# Patient Record
Sex: Female | Born: 1970 | Race: Black or African American | Hispanic: No | Marital: Married | Smoking: Never smoker
Health system: Southern US, Community
[De-identification: ages and names within clinical notes are randomized; demographics above are authoritative.]

## PROBLEM LIST (undated history)

## (undated) ENCOUNTER — Emergency Department (HOSPITAL_COMMUNITY): Admission: EM | Source: Home / Self Care

## (undated) DIAGNOSIS — O99019 Anemia complicating pregnancy, unspecified trimester: Secondary | ICD-10-CM

## (undated) DIAGNOSIS — I10 Essential (primary) hypertension: Secondary | ICD-10-CM

## (undated) DIAGNOSIS — R12 Heartburn: Secondary | ICD-10-CM

## (undated) DIAGNOSIS — O26899 Other specified pregnancy related conditions, unspecified trimester: Secondary | ICD-10-CM

## (undated) HISTORY — PX: NO PAST SURGERIES: SHX2092

## (undated) HISTORY — DX: Anemia complicating pregnancy, unspecified trimester: O99.019

---

## 2012-10-26 ENCOUNTER — Ambulatory Visit: Payer: BC Managed Care – PPO | Admitting: Family Medicine

## 2012-10-26 VITALS — BP 152/100 | HR 88 | Temp 99.4°F | Resp 12 | Ht 64.0 in | Wt 155.0 lb

## 2012-10-26 DIAGNOSIS — J029 Acute pharyngitis, unspecified: Secondary | ICD-10-CM

## 2012-10-26 DIAGNOSIS — R079 Chest pain, unspecified: Secondary | ICD-10-CM

## 2012-10-26 DIAGNOSIS — O0001 Abdominal pregnancy with intrauterine pregnancy: Secondary | ICD-10-CM

## 2012-10-26 DIAGNOSIS — K219 Gastro-esophageal reflux disease without esophagitis: Secondary | ICD-10-CM

## 2012-10-26 DIAGNOSIS — I10 Essential (primary) hypertension: Secondary | ICD-10-CM

## 2012-10-26 DIAGNOSIS — R5383 Other fatigue: Secondary | ICD-10-CM

## 2012-10-26 DIAGNOSIS — R5381 Other malaise: Secondary | ICD-10-CM

## 2012-10-26 LAB — POCT RAPID STREP A (OFFICE): Rapid Strep A Screen: NEGATIVE

## 2012-10-26 LAB — POCT URINE PREGNANCY: Preg Test, Ur: POSITIVE

## 2012-10-26 LAB — POCT UA - MICROSCOPIC ONLY
Bacteria, U Microscopic: NEGATIVE
Casts, Ur, LPF, POC: NEGATIVE
Crystals, Ur, HPF, POC: NEGATIVE
Mucus, UA: NEGATIVE
Yeast, UA: NEGATIVE

## 2012-10-26 LAB — POCT URINALYSIS DIPSTICK
Blood, UA: NEGATIVE
Protein, UA: NEGATIVE
Spec Grav, UA: 1.015
Urobilinogen, UA: 0.2
pH, UA: 7

## 2012-10-26 LAB — POCT CBC
Granulocyte percent: 67.9 %G (ref 37–80)
Lymph, poc: 2 (ref 0.6–3.4)
MCHC: 31 g/dL — AB (ref 31.8–35.4)
MCV: 76.2 fL — AB (ref 80–97)
MID (cbc): 0.5 (ref 0–0.9)
POC LYMPH PERCENT: 25.3 %L (ref 10–50)
Platelet Count, POC: 480 10*3/uL — AB (ref 142–424)
RDW, POC: 19.8 %

## 2012-10-26 MED ORDER — LABETALOL HCL 100 MG PO TABS: 100.0000 mg | ORAL_TABLET | Freq: Two times a day (BID) | ORAL | Status: DC

## 2012-10-26 NOTE — Progress Notes (Addendum)
Urgent [facility] and [facility] [address], [place] [address] [phone]  Date:  10/26/2012   Name:  Megan Baxter   DOB:  [birth year]   MRN:  [id number]  PCP:  No primary provider on file.    Chief Complaint: trouble swallowing and chest congestion   History of Present Illness:  Megan Baxter is a 42 y.o. very pleasant female patient who presents with the following:  Here today with illness. She is here today with her husband who interpreted for her, as she does not speak [place] She is pregnant currently.  She is currently about [redacted] weeks pregnant.  They have not seen a doctor yet for her pregnancy.   She does not have a ST.  She feels like there is mucus caught in her throat, she has to clear her throat and try to cough it up.  She is not actually having any trouble swallowing food/ liquid or breathing.   No cough, she has felt feverish at home sometimes.  She has not checked her temperature.  She also notes heartburn- burning over her epigastric area. She is bringing up acid/ food into her throat.  She also notes some left sided chest pain- this history is vague, but has been occuring for about 8 months, will last a few seconds or minutes, and is not related to exertion.  She has no known history of cardiac problems.    LMP 08/13/2012- this will make her 10+4, [facility] 05/20/2013 She has not noted any vaginal bleeding, no lower abdominal cramping or pain  She has been told that her BP was high in the past, and she was treated with some sort of medication in the past (while in [place]).  She is not sure what she took and is not taking it now There is no problem list on file for this patient.   No past medical history on file.  No past surgical history on file.  History  Substance Use Topics  . Smoking status: Never Smoker   . Smokeless tobacco: Not on file  . Alcohol Use: Not on file    No family history on file.  No Known Allergies  Medication list has been reviewed  and updated.  No current outpatient prescriptions on file prior to visit.   No current facility-administered medications on file prior to visit.    Review of Systems:  As per HPI- otherwise negative. History through husband as she does not speak English  Physical Examination: Filed Vitals:   10/26/12 1058  BP: 152/92  Pulse: 88  Temp: 99.4 F (37.4 C)  Resp: 12   Filed Vitals:   10/26/12 1058  Height: 5\' 4"  (1.626 m)  Weight: 155 lb (70.308 kg)   Body mass index is 26.59 kg/(m^2). Ideal Body Weight: Weight in (lb) to have BMI = 25: 145.3  GEN: WDWN, NAD, Non-toxic, A & O x 3 HEENT: Atraumatic, Normocephalic. Neck supple. No masses, No LAD.  Bilateral TM wnl, oropharynx normal.  PEERL,EOMI.   Ears and Nose: No external deformity. CV: RRR, No M/G/R. No JVD. No thrill. No extra heart sounds. PULM: CTA B, no wheezes, crackles, rhonchi. No retractions. No resp. distress. No accessory muscle use. ABD: S, NT, ND. No rebound. No HSM.  No fundus palpated.   Not able to reproduce her CP by pressing on her chest wall, no rash or lesion.   EXTR: No c/c/e NEURO Normal gait.  PSYCH: Normally interactive. Conversant. Not depressed or anxious appearing.  Calm demeanor.   Results for orders placed in visit on 10/26/12  POCT RAPID STREP A (OFFICE)      Result Value Range   Rapid Strep A Screen Negative  Negative  POCT CBC      Result Value Range   WBC 7.9  4.6 - 10.2 K/uL   Lymph, poc 2.0  0.6 - 3.4   POC LYMPH PERCENT 25.3  10 - 50 %L   MID (cbc) 0.5  0 - 0.9   POC MID % 6.8  0 - 12 %M   POC Granulocyte 5.4  2 - 6.9   Granulocyte percent 67.9  37 - 80 %G   RBC 4.02 (*) 4.04 - 5.48 M/uL   Hemoglobin 9.5 (*) 12.2 - 16.2 g/dL   HCT, POC 16.1 (*) 09.6 - 47.9 %   MCV 76.2 (*) 80 - 97 fL   MCH, POC 23.6 (*) 27 - 31.2 pg   MCHC 31.0 (*) 31.8 - 35.4 g/dL   RDW, POC 04.5     Platelet Count, POC 480 (*) 142 - 424 K/uL   MPV 8.0  0 - 99.8 fL  POCT URINALYSIS DIPSTICK      Result  Value Range   Color, UA light yellow     Clarity, UA clear     Glucose, UA neg     Bilirubin, UA neg     Ketones, UA neg     Spec Grav, UA 1.015     Blood, UA neg     pH, UA 7.0     Protein, UA neg     Urobilinogen, UA 0.2     Nitrite, UA neg     Leukocytes, UA Negative    POCT URINE PREGNANCY      Result Value Range   Preg Test, Ur Positive    POCT UA - MICROSCOPIC ONLY      Result Value Range   WBC, Ur, HPF, POC 0-1     RBC, urine, microscopic neg     Bacteria, U Microscopic neg     Mucus, UA neg     Epithelial cells, urine per micros 0-1     Crystals, Ur, HPF, POC neg     Casts, Ur, LPF, POC neg     Yeast, UA neg      EKG: NSR without any concerning ST elevation or depression  Called and discussed with CNM on duty at [facility] hospital. As she is early in her pregnancy, pre- eclampsia is not a major concern.  She has no proteinuria.  Plan to start her on labetalol for her HTN at 100 mg BID.  Will have her get established with the OB of her choice, and can follow- up her BP here until she is established with OB.  However, no issue today that necessitates a visit to the [facility] emergency department   Assessment and Plan:  Acute pharyngitis - Plan: POCT rapid strep A  GERD (gastroesophageal reflux disease)  Other malaise and fatigue - Plan: POCT CBC, POCT UA - Microscopic Only  Abdominal pregnancy with intrauterine pregnancy - Plan: POCT urinalysis dipstick, POCT urine pregnancy, POCT UA - Microscopic Only  Chest pain - Plan: EKG 12-Lead  HTN (hypertension) - Plan: labetalol (NORMODYNE) 100 MG tablet  Essential hypertension and pregnancy.  Will treat with labetalol 100 BID Chest pain: pt relates this as being due to her GERD/ chest congestion symptoms.  EKG today is reassuring, and her pain is atypical in character.  They are to monitor her symptoms and seek care if any change or worsening Suspect her chest congestion/ swallowing symptoms are due to GERD. Printed out OTC  medications in pregnancy sheet from [facility] and suggested appropriate OTC medications Anemia: start PNV, needs follow-up, check ferritin and will be in touch with her Encouraged pt and her husband to look at [facility] websites today to choose a provider, and to call as soon as possible to schedule her first appt.  In the meantime, will check her back in one week for a BP check   Meds ordered this encounter  Medications  . labetalol (NORMODYNE) 100 MG tablet    Sig: Take 1 tablet (100 mg total) by mouth 2 (two) times daily.    Dispense:  60 tablet    Refill:  3     Signed Abbe Amsterdam, MD

## 2012-10-26 NOTE — Patient Instructions (Addendum)
We are going to have you start on labetolol for your blood pressure.  Also, please buy and start on pre- natal vitamins right away.  Please come back and see [place] early next week for a BP check  Please call the OB office of your choice and schedule a new pre- natal appointment as soon as you can.    Some choices are [facility], [facility] [place] and [facility]

## 2012-10-27 ENCOUNTER — Telehealth: Payer: Self-pay | Admitting: Family Medicine

## 2012-10-27 NOTE — Telephone Encounter (Signed)
Called both numbers on chart to try and check on Megan Baxter.  At home number a female answered who did not speak [place] (unsure of who this person is, her husband does speak [place]).  Left message on cell that I was calling to check on Megan Baxter- let me know if she is not feeling better, otherwise will look for her next week.  If I see her again will check ferritin then.

## 2012-10-27 NOTE — Telephone Encounter (Signed)
Message copied by Pearline Cables on Thu Oct 27, 2012  4:50 PM ------      Message from: Honor Loh      Created: Thu Oct 27, 2012 10:06 AM       There were no labs sent out on this patient.       ----- Message -----         From: Pearline Cables, MD         Sent: 10/26/2012   8:51 PM           To: [facility] Lab Pool            Can we add on a ferritin?  Dx: microcytic anemia      Thanks!        ------

## 2012-11-02 ENCOUNTER — Ambulatory Visit: Payer: BC Managed Care – PPO | Admitting: Family Medicine

## 2012-11-02 VITALS — BP 137/92 | HR 78 | Temp 98.9°F | Resp 18 | Wt 151.0 lb

## 2012-11-02 DIAGNOSIS — O0001 Abdominal pregnancy with intrauterine pregnancy: Secondary | ICD-10-CM

## 2012-11-02 DIAGNOSIS — I10 Essential (primary) hypertension: Secondary | ICD-10-CM

## 2012-11-02 LAB — POCT URINALYSIS DIPSTICK
Blood, UA: NEGATIVE
Nitrite, UA: NEGATIVE
Protein, UA: NEGATIVE
Spec Grav, UA: 1.01
Urobilinogen, UA: 0.2
pH, UA: 7

## 2012-11-02 NOTE — Patient Instructions (Addendum)
I will make an appointment for you with a OB doctor

## 2012-11-02 NOTE — Progress Notes (Addendum)
[facility] [address], [place] [address] [phone]  Date:  11/02/2012   Name:  Megan Baxter   DOB:  25-Apr-1971   MRN:  [id number]  PCP:  No primary provider on file.    Chief Complaint: Hypertension   History of Present Illness:  Megan Baxter is a 42 y.o. very pleasant female patient who presents with the following:  Here today for a recheck- she is using mucinex and zantac OTC for congestion and GERD.  She is also pregnant, and I prescribed labetolol to treat her for HTN (essential HTN) at our last visit.  Her BP looks much better today, but on further questioning she has actually not started the labetolol.  They also have not called to schedule an app with OB- GYN as of yet.   She continues to spit out some saliva. She does not have any vomiting.  She admits she has had this sort of problem in the past with her other pregnancies. Her GERD symptoms are overall much better, and she no longer has any pain.  It seems that her GERD/ morning sickness is easing off.   No cramping, abdominal pain, or bleeding.   She feels well Patient Active Problem List  Diagnosis  . HTN (hypertension)    No past medical history on file.  No past surgical history on file.  History  Substance Use Topics  . Smoking status: Never Smoker   . Smokeless tobacco: Not on file  . Alcohol Use: Not on file    No family history on file.  No Known Allergies  Medication list has been reviewed and updated.  Current Outpatient Prescriptions on File Prior to Visit  Medication Sig Dispense Refill  . labetalol (NORMODYNE) 100 MG tablet Take 1 tablet (100 mg total) by mouth 2 (two) times daily.  60 tablet  3   No current facility-administered medications on file prior to visit.    Review of Systems:  As per HPI- otherwise negative.   Physical Examination: Filed Vitals:   11/02/12 1021  BP: 137/92  Pulse: 78  Temp: 98.9 F (37.2 C)  Resp: 18   Filed Vitals:    11/02/12 1021  Weight: 151 lb (68.493 kg)   Body mass index is 25.91 kg/(m^2). Ideal Body Weight:    GEN: WDWN, NAD, Non-toxic, Alert, language barrier but we communicated through her husband.   HEENT: Atraumatic, Normocephalic. Neck supple. No masses, No LAD.  PEERL, oropharynx wnl Ears and Nose: No external deformity. CV: RRR, No M/G/R. No JVD. No thrill. No extra heart sounds. PULM: CTA B, no wheezes, crackles, rhonchi. No retractions. No resp. distress. No accessory muscle use. ABD: S, NT, ND. No rebound. No HSM.  Fundus not palpable.  EXTR: No c/c/e NEURO Normal gait.  PSYCH: Normally interactive. Conversant. Not depressed or anxious appearing.  Calm demeanor.   Results for orders placed in visit on 11/02/12  POCT URINALYSIS DIPSTICK      Result Value Range   Color, UA yellow     Clarity, UA hazy     Glucose, UA neg     Bilirubin, UA neg     Ketones, UA neg     Spec Grav, UA 1.010     Blood, UA neg     pH, UA 7.0     Protein, UA neg     Urobilinogen, UA 0.2     Nitrite, UA neg     Leukocytes, UA Trace  Assessment and Plan: HTN (hypertension) - Plan: POCT urinalysis dipstick  Abdominal pregnancy with intrauterine pregnancy - Plan: Ambulatory referral to Obstetrics / Gynecology no proteinuria, and BP is better today.  Will ask referrals to schedule an OB appt for her, as I am not sure they understand how to do this.  Today her BP looks better, so her essential BP may be improving due to pregnancy.  Would like to avoid any unnecessary medications due to pregnancy, so will defer having her start labetolol.  If appt with OB not obtained within a week they are to come and see me for a BP recheck.     Signed Abbe Amsterdam, MD

## 2013-03-02 ENCOUNTER — Encounter: Payer: Self-pay | Admitting: Obstetrics & Gynecology

## 2013-03-02 LAB — US OB COMP + 14 WK

## 2013-04-13 ENCOUNTER — Other Ambulatory Visit: Payer: Self-pay | Admitting: Obstetrics and Gynecology

## 2013-04-14 ENCOUNTER — Other Ambulatory Visit: Payer: Self-pay | Admitting: Obstetrics and Gynecology

## 2013-04-25 ENCOUNTER — Other Ambulatory Visit: Payer: Self-pay | Admitting: Obstetrics and Gynecology

## 2013-04-25 DIAGNOSIS — O169 Unspecified maternal hypertension, unspecified trimester: Secondary | ICD-10-CM

## 2013-04-25 DIAGNOSIS — O09529 Supervision of elderly multigravida, unspecified trimester: Secondary | ICD-10-CM

## 2013-04-26 ENCOUNTER — Inpatient Hospital Stay (HOSPITAL_COMMUNITY): Payer: BC Managed Care – PPO | Admitting: Anesthesiology

## 2013-04-26 ENCOUNTER — Other Ambulatory Visit: Payer: Self-pay | Admitting: Obstetrics and Gynecology

## 2013-04-26 ENCOUNTER — Encounter (HOSPITAL_COMMUNITY): Payer: Self-pay | Admitting: Anesthesiology

## 2013-04-26 ENCOUNTER — Inpatient Hospital Stay (HOSPITAL_COMMUNITY)
Admission: AD | Admit: 2013-04-26 | Discharge: 2013-04-29 | DRG: 650 | Disposition: A | Discharge status: Home / Self Care | Payer: BC Managed Care – PPO | Source: Ambulatory Visit | Attending: Obstetrics and Gynecology | Admitting: Obstetrics and Gynecology

## 2013-04-26 ENCOUNTER — Encounter (HOSPITAL_COMMUNITY): Payer: Self-pay | Admitting: *Deleted

## 2013-04-26 ENCOUNTER — Encounter (HOSPITAL_COMMUNITY): Payer: Self-pay

## 2013-04-26 ENCOUNTER — Encounter (HOSPITAL_COMMUNITY)
Admission: AD | Disposition: A | Discharge status: Home / Self Care | Payer: Self-pay | Source: Ambulatory Visit | Attending: Obstetrics and Gynecology

## 2013-04-26 ENCOUNTER — Ambulatory Visit (HOSPITAL_COMMUNITY)
Admission: RE | Admit: 2013-04-26 | Discharge: 2013-04-26 | Disposition: A | Discharge status: Home / Self Care | Payer: BC Managed Care – PPO | Source: Ambulatory Visit | Attending: Obstetrics and Gynecology | Admitting: Obstetrics and Gynecology

## 2013-04-26 DIAGNOSIS — O1002 Pre-existing essential hypertension complicating childbirth: Principal | ICD-10-CM | POA: Diagnosis present

## 2013-04-26 DIAGNOSIS — O9903 Anemia complicating the puerperium: Secondary | ICD-10-CM | POA: Diagnosis not present

## 2013-04-26 DIAGNOSIS — O09529 Supervision of elderly multigravida, unspecified trimester: Secondary | ICD-10-CM

## 2013-04-26 DIAGNOSIS — D649 Anemia, unspecified: Secondary | ICD-10-CM | POA: Diagnosis not present

## 2013-04-26 DIAGNOSIS — O169 Unspecified maternal hypertension, unspecified trimester: Secondary | ICD-10-CM

## 2013-04-26 DIAGNOSIS — O365990 Maternal care for other known or suspected poor fetal growth, unspecified trimester, not applicable or unspecified: Secondary | ICD-10-CM | POA: Diagnosis present

## 2013-04-26 HISTORY — DX: Essential (primary) hypertension: I10

## 2013-04-26 HISTORY — DX: Other specified pregnancy related conditions, unspecified trimester: O26.899

## 2013-04-26 HISTORY — DX: Heartburn: R12

## 2013-04-26 LAB — CBC
MCH: 26.2 pg (ref 26.0–34.0)
MCHC: 33.6 g/dL (ref 30.0–36.0)
MCV: 77.9 fL — ABNORMAL LOW (ref 78.0–100.0)
Platelets: 350 10*3/uL (ref 150–400)
RDW: 14.7 % (ref 11.5–15.5)
WBC: 7.5 10*3/uL (ref 4.0–10.5)

## 2013-04-26 LAB — COMPREHENSIVE METABOLIC PANEL
AST: 10 U/L (ref 0–37)
Albumin: 3.1 g/dL — ABNORMAL LOW (ref 3.5–5.2)
Alkaline Phosphatase: 166 U/L — ABNORMAL HIGH (ref 39–117)
Chloride: 102 mEq/L (ref 96–112)
Creatinine, Ser: 0.49 mg/dL — ABNORMAL LOW (ref 0.50–1.10)
Potassium: 3.9 mEq/L (ref 3.5–5.1)
Total Bilirubin: 0.4 mg/dL (ref 0.3–1.2)
Total Protein: 7 g/dL (ref 6.0–8.3)

## 2013-04-26 LAB — URINALYSIS, ROUTINE W REFLEX MICROSCOPIC
Glucose, UA: NEGATIVE mg/dL
Leukocytes, UA: NEGATIVE
Nitrite: NEGATIVE
Protein, ur: NEGATIVE mg/dL
Specific Gravity, Urine: 1.01 (ref 1.005–1.030)
pH: 7 (ref 5.0–8.0)

## 2013-04-26 LAB — LACTATE DEHYDROGENASE: LDH: 63 U/L — ABNORMAL LOW (ref 94–250)

## 2013-04-26 LAB — URIC ACID: Uric Acid, Serum: 6.2 mg/dL (ref 2.4–7.0)

## 2013-04-26 LAB — PROTEIN / CREATININE RATIO, URINE
Creatinine, Urine: 20.68 mg/dL
Total Protein, Urine: <4 mg/dL

## 2013-04-26 SURGERY — Surgical Case
Anesthesia: Regional | Site: Abdomen | Wound class: Clean Contaminated

## 2013-04-26 MED ORDER — FAMOTIDINE IN NACL 20-0.9 MG/50ML-% IV SOLN
20.0000 mg | Freq: Once | INTRAVENOUS | Status: AC
  Administered 2013-04-26: 20 mg via INTRAVENOUS
  Filled 2013-04-26: qty 50

## 2013-04-26 MED ORDER — PHENYLEPHRINE HCL 10 MG/ML IJ SOLN
INTRAMUSCULAR | Status: DC | PRN
  Administered 2013-04-26 (×4): 40 ug via INTRAVENOUS

## 2013-04-26 MED ORDER — NALBUPHINE HCL 10 MG/ML IJ SOLN
5.0000 mg | INTRAMUSCULAR | Status: DC | PRN
  Filled 2013-04-26: qty 1

## 2013-04-26 MED ORDER — LACTATED RINGERS IV SOLN
INTRAVENOUS | Status: DC
  Administered 2013-04-26 (×4): via INTRAVENOUS

## 2013-04-26 MED ORDER — SODIUM CHLORIDE 0.9 % IJ SOLN
3.0000 mL | INTRAMUSCULAR | Status: DC | PRN
  Administered 2013-04-27: 3 mL via INTRAVENOUS

## 2013-04-26 MED ORDER — METHYLERGONOVINE MALEATE 0.2 MG PO TABS: 0.2000 mg | ORAL_TABLET | ORAL | Status: DC | PRN

## 2013-04-26 MED ORDER — DIPHENHYDRAMINE HCL 50 MG/ML IJ SOLN: 12.5000 mg | INTRAMUSCULAR | Status: DC | PRN

## 2013-04-26 MED ORDER — HYDRALAZINE HCL 20 MG/ML IJ SOLN
10.0000 mg | INTRAMUSCULAR | Status: AC
  Administered 2013-04-26: 10 mg via INTRAVENOUS
  Filled 2013-04-26: qty 1

## 2013-04-26 MED ORDER — ONDANSETRON HCL 4 MG/2ML IJ SOLN: 4.0000 mg | Freq: Three times a day (TID) | INTRAMUSCULAR | Status: DC | PRN

## 2013-04-26 MED ORDER — KETOROLAC TROMETHAMINE 30 MG/ML IJ SOLN: 30.0000 mg | Freq: Four times a day (QID) | INTRAMUSCULAR | Status: AC | PRN

## 2013-04-26 MED ORDER — CEFAZOLIN SODIUM-DEXTROSE 2-3 GM-% IV SOLR
2.0000 g | INTRAVENOUS | Status: DC
  Filled 2013-04-26: qty 50

## 2013-04-26 MED ORDER — SIMETHICONE 80 MG PO CHEW: 80.0000 mg | CHEWABLE_TABLET | ORAL | Status: DC

## 2013-04-26 MED ORDER — PROMETHAZINE HCL 25 MG/ML IJ SOLN: 6.2500 mg | INTRAMUSCULAR | Status: DC | PRN

## 2013-04-26 MED ORDER — OXYTOCIN 40 UNITS IN LACTATED RINGERS INFUSION - SIMPLE MED
62.5000 mL/h | INTRAVENOUS | Status: AC
  Administered 2013-04-26: 62.5 mL/h via INTRAVENOUS
  Filled 2013-04-26: qty 1000

## 2013-04-26 MED ORDER — OXYTOCIN 10 UNIT/ML IJ SOLN
40.0000 [IU] | INTRAVENOUS | Status: DC | PRN
  Administered 2013-04-26: 40 [IU] via INTRAVENOUS

## 2013-04-26 MED ORDER — NALOXONE HCL 0.4 MG/ML IJ SOLN: 0.4000 mg | INTRAMUSCULAR | Status: DC | PRN

## 2013-04-26 MED ORDER — WITCH HAZEL-GLYCERIN EX PADS: 1.0000 "application " | MEDICATED_PAD | CUTANEOUS | Status: DC | PRN

## 2013-04-26 MED ORDER — MEASLES, MUMPS & RUBELLA VAC ~~LOC~~ INJ
0.5000 mL | INJECTION | Freq: Once | SUBCUTANEOUS | Status: DC
  Filled 2013-04-26: qty 0.5

## 2013-04-26 MED ORDER — MEPERIDINE HCL 25 MG/ML IJ SOLN: 6.2500 mg | INTRAMUSCULAR | Status: DC | PRN

## 2013-04-26 MED ORDER — SIMETHICONE 80 MG PO CHEW
80.0000 mg | CHEWABLE_TABLET | Freq: Three times a day (TID) | ORAL | Status: DC
  Administered 2013-04-27 – 2013-04-29 (×5): 80 mg via ORAL

## 2013-04-26 MED ORDER — LABETALOL HCL 5 MG/ML IV SOLN
3.0000 mg/min | INTRAVENOUS | Status: DC
  Administered 2013-04-26: 1 mg/min via INTRAVENOUS
  Filled 2013-04-26 (×2): qty 100

## 2013-04-26 MED ORDER — BUPIVACAINE HCL (PF) 0.25 % IJ SOLN
INTRAMUSCULAR | Status: DC | PRN
  Administered 2013-04-26: 20 mL

## 2013-04-26 MED ORDER — DIPHENHYDRAMINE HCL 25 MG PO CAPS: 25.0000 mg | ORAL_CAPSULE | ORAL | Status: DC | PRN

## 2013-04-26 MED ORDER — BUPIVACAINE IN DEXTROSE 0.75-8.25 % IT SOLN
INTRATHECAL | Status: DC | PRN
  Administered 2013-04-26: 1.4 mL via INTRATHECAL

## 2013-04-26 MED ORDER — METOCLOPRAMIDE HCL 5 MG/ML IJ SOLN: 10.0000 mg | Freq: Three times a day (TID) | INTRAMUSCULAR | Status: DC | PRN

## 2013-04-26 MED ORDER — OXYCODONE-ACETAMINOPHEN 5-325 MG PO TABS
1.0000 | ORAL_TABLET | ORAL | Status: DC | PRN
  Administered 2013-04-28: 2 via ORAL
  Filled 2013-04-26: qty 2

## 2013-04-26 MED ORDER — LABETALOL HCL 5 MG/ML IV SOLN
20.0000 mg | Freq: Once | INTRAVENOUS | Status: AC
  Administered 2013-04-26: 10 mg via INTRAVENOUS

## 2013-04-26 MED ORDER — MORPHINE SULFATE (PF) 0.5 MG/ML IJ SOLN
INTRAMUSCULAR | Status: DC | PRN
  Administered 2013-04-26: .2 mg via INTRATHECAL

## 2013-04-26 MED ORDER — PRENATAL MULTIVITAMIN CH
1.0000 | ORAL_TABLET | Freq: Every day | ORAL | Status: DC
  Administered 2013-04-27 – 2013-04-29 (×2): 1 via ORAL
  Filled 2013-04-26 (×2): qty 1

## 2013-04-26 MED ORDER — DIBUCAINE 1 % RE OINT: 1.0000 "application " | TOPICAL_OINTMENT | RECTAL | Status: DC | PRN

## 2013-04-26 MED ORDER — LABETALOL HCL 5 MG/ML IV SOLN
40.0000 mg | Freq: Once | INTRAVENOUS | Status: AC
  Administered 2013-04-26: 40 mg via INTRAVENOUS

## 2013-04-26 MED ORDER — ZOLPIDEM TARTRATE 5 MG PO TABS: 5.0000 mg | ORAL_TABLET | Freq: Every evening | ORAL | Status: DC | PRN

## 2013-04-26 MED ORDER — SCOPOLAMINE 1 MG/3DAYS TD PT72
1.0000 | MEDICATED_PATCH | Freq: Once | TRANSDERMAL | Status: DC
  Filled 2013-04-26: qty 1

## 2013-04-26 MED ORDER — ONDANSETRON HCL 4 MG PO TABS: 4.0000 mg | ORAL_TABLET | ORAL | Status: DC | PRN

## 2013-04-26 MED ORDER — HYDROMORPHONE HCL PF 1 MG/ML IJ SOLN
INTRAMUSCULAR | Status: AC
  Administered 2013-04-26: 0.5 mg via INTRAVENOUS
  Filled 2013-04-26: qty 1

## 2013-04-26 MED ORDER — LANOLIN HYDROUS EX OINT: 1.0000 "application " | TOPICAL_OINTMENT | CUTANEOUS | Status: DC | PRN

## 2013-04-26 MED ORDER — SENNOSIDES-DOCUSATE SODIUM 8.6-50 MG PO TABS
2.0000 | ORAL_TABLET | ORAL | Status: DC
  Administered 2013-04-27 – 2013-04-28 (×3): 2 via ORAL

## 2013-04-26 MED ORDER — ONDANSETRON HCL 4 MG/2ML IJ SOLN: 4.0000 mg | INTRAMUSCULAR | Status: DC | PRN

## 2013-04-26 MED ORDER — MENTHOL 3 MG MT LOZG: 1.0000 | LOZENGE | OROMUCOSAL | Status: DC | PRN

## 2013-04-26 MED ORDER — ONDANSETRON HCL 4 MG/2ML IJ SOLN
INTRAMUSCULAR | Status: DC | PRN
  Administered 2013-04-26: 4 mg via INTRAVENOUS

## 2013-04-26 MED ORDER — LABETALOL HCL 5 MG/ML IV SOLN
INTRAVENOUS | Status: AC
  Administered 2013-04-26: 10 mg
  Filled 2013-04-26: qty 4

## 2013-04-26 MED ORDER — FENTANYL CITRATE 0.05 MG/ML IJ SOLN
INTRAMUSCULAR | Status: DC | PRN
  Administered 2013-04-26: 25 ug via INTRATHECAL

## 2013-04-26 MED ORDER — CITRIC ACID-SODIUM CITRATE 334-500 MG/5ML PO SOLN
30.0000 mL | Freq: Once | ORAL | Status: AC
  Administered 2013-04-26: 30 mL via ORAL
  Filled 2013-04-26: qty 15

## 2013-04-26 MED ORDER — CEFAZOLIN SODIUM-DEXTROSE 2-3 GM-% IV SOLR
2.0000 g | Freq: Once | INTRAVENOUS | Status: AC
  Administered 2013-04-26: 2 g via INTRAVENOUS

## 2013-04-26 MED ORDER — KETOROLAC TROMETHAMINE 30 MG/ML IJ SOLN
15.0000 mg | Freq: Once | INTRAMUSCULAR | Status: AC | PRN
  Administered 2013-04-26: 30 mg via INTRAVENOUS

## 2013-04-26 MED ORDER — METHYLERGONOVINE MALEATE 0.2 MG/ML IJ SOLN: 0.2000 mg | INTRAMUSCULAR | Status: DC | PRN

## 2013-04-26 MED ORDER — PANTOPRAZOLE SODIUM 40 MG PO TBEC
40.0000 mg | DELAYED_RELEASE_TABLET | Freq: Every day | ORAL | Status: DC
  Administered 2013-04-26 – 2013-04-29 (×3): 40 mg via ORAL
  Filled 2013-04-26 (×5): qty 1

## 2013-04-26 MED ORDER — NALOXONE HCL 1 MG/ML IJ SOLN
1.0000 ug/kg/h | INTRAMUSCULAR | Status: DC | PRN
  Filled 2013-04-26: qty 2

## 2013-04-26 MED ORDER — DIPHENHYDRAMINE HCL 50 MG/ML IJ SOLN: 25.0000 mg | INTRAMUSCULAR | Status: DC | PRN

## 2013-04-26 MED ORDER — HYDROMORPHONE HCL PF 1 MG/ML IJ SOLN
0.2500 mg | INTRAMUSCULAR | Status: DC | PRN
  Administered 2013-04-26: 0.5 mg via INTRAVENOUS

## 2013-04-26 MED ORDER — DIPHENHYDRAMINE HCL 25 MG PO CAPS: 25.0000 mg | ORAL_CAPSULE | Freq: Four times a day (QID) | ORAL | Status: DC | PRN

## 2013-04-26 MED ORDER — FERROUS SULFATE 325 (65 FE) MG PO TABS
325.0000 mg | ORAL_TABLET | Freq: Two times a day (BID) | ORAL | Status: DC
  Administered 2013-04-27 – 2013-04-29 (×2): 325 mg via ORAL
  Filled 2013-04-26 (×3): qty 1

## 2013-04-26 MED ORDER — KETOROLAC TROMETHAMINE 60 MG/2ML IM SOLN: 60.0000 mg | Freq: Once | INTRAMUSCULAR | Status: AC | PRN

## 2013-04-26 MED ORDER — LABETALOL HCL 5 MG/ML IV SOLN
0.5000 mg/min | INTRAVENOUS | Status: DC
  Administered 2013-04-26: 0.5 mg/min via INTRAVENOUS
  Administered 2013-04-27: 2 mg/min via INTRAVENOUS
  Filled 2013-04-26 (×2): qty 100

## 2013-04-26 MED ORDER — LABETALOL HCL 5 MG/ML IV SOLN
INTRAVENOUS | Status: AC
  Filled 2013-04-26: qty 8

## 2013-04-26 MED ORDER — IBUPROFEN 600 MG PO TABS
600.0000 mg | ORAL_TABLET | Freq: Four times a day (QID) | ORAL | Status: DC
  Administered 2013-04-27 – 2013-04-29 (×11): 600 mg via ORAL
  Filled 2013-04-26 (×11): qty 1

## 2013-04-26 MED ORDER — LABETALOL HCL 5 MG/ML IV SOLN
20.0000 mg | Freq: Once | INTRAVENOUS | Status: AC
  Administered 2013-04-26: 20 mg via INTRAVENOUS
  Filled 2013-04-26: qty 4

## 2013-04-26 MED ORDER — TETANUS-DIPHTH-ACELL PERTUSSIS 5-2.5-18.5 LF-MCG/0.5 IM SUSP
0.5000 mL | Freq: Once | INTRAMUSCULAR | Status: AC
  Administered 2013-04-27: 0.5 mL via INTRAMUSCULAR
  Filled 2013-04-26 (×2): qty 0.5

## 2013-04-26 MED ORDER — NIFEDIPINE ER 30 MG PO TB24
30.0000 mg | ORAL_TABLET | Freq: Every day | ORAL | Status: DC
  Administered 2013-04-26 – 2013-04-27 (×2): 30 mg via ORAL
  Filled 2013-04-26 (×3): qty 1

## 2013-04-26 MED ORDER — KETOROLAC TROMETHAMINE 30 MG/ML IJ SOLN
INTRAMUSCULAR | Status: AC
  Administered 2013-04-26: 30 mg via INTRAVENOUS
  Filled 2013-04-26: qty 1

## 2013-04-26 MED ORDER — SIMETHICONE 80 MG PO CHEW: 80.0000 mg | CHEWABLE_TABLET | ORAL | Status: DC | PRN

## 2013-04-26 SURGICAL SUPPLY — 35 items
BENZOIN TINCTURE PRP APPL 2/3 (GAUZE/BANDAGES/DRESSINGS) ×2 IMPLANT
BOOTIES KNEE HIGH SLOAN (MISCELLANEOUS) ×4 IMPLANT
CLAMP CORD UMBIL (MISCELLANEOUS) IMPLANT
CLOTH BEACON ORANGE TIMEOUT ST (SAFETY) ×2 IMPLANT
DRAIN JACKSON PRT FLT 10 (DRAIN) IMPLANT
DRAPE LG THREE QUARTER DISP (DRAPES) ×4 IMPLANT
DRSG OPSITE POSTOP 4X10 (GAUZE/BANDAGES/DRESSINGS) ×2 IMPLANT
DURAPREP 26ML APPLICATOR (WOUND CARE) ×2 IMPLANT
ELECT REM PT RETURN 9FT ADLT (ELECTROSURGICAL) ×2
ELECTRODE REM PT RTRN 9FT ADLT (ELECTROSURGICAL) ×1 IMPLANT
EVACUATOR SILICONE 100CC (DRAIN) IMPLANT
EXTRACTOR VACUUM M CUP 4 TUBE (SUCTIONS) IMPLANT
GLOVE BIOGEL PI IND STRL 7.0 (GLOVE) ×1 IMPLANT
GLOVE BIOGEL PI INDICATOR 7.0 (GLOVE) ×1
GLOVE ECLIPSE 6.5 STRL STRAW (GLOVE) ×2 IMPLANT
GOWN PREVENTION PLUS XLARGE (GOWN DISPOSABLE) ×2 IMPLANT
GOWN STRL REIN XL XLG (GOWN DISPOSABLE) ×2 IMPLANT
KIT ABG SYR 3ML LUER SLIP (SYRINGE) ×2 IMPLANT
NEEDLE HYPO 22GX1.5 SAFETY (NEEDLE) ×2 IMPLANT
NEEDLE HYPO 25X5/8 SAFETYGLIDE (NEEDLE) IMPLANT
NS IRRIG 1000ML POUR BTL (IV SOLUTION) ×4 IMPLANT
PACK C SECTION WH (CUSTOM PROCEDURE TRAY) ×2 IMPLANT
PAD OB MATERNITY 4.3X12.25 (PERSONAL CARE ITEMS) ×2 IMPLANT
RTRCTR C-SECT PINK 25CM LRG (MISCELLANEOUS) ×2 IMPLANT
STRIP CLOSURE SKIN 1/2X4 (GAUZE/BANDAGES/DRESSINGS) ×2 IMPLANT
SUT CHROMIC GUT AB #0 18 (SUTURE) IMPLANT
SUT MNCRL AB 3-0 PS2 27 (SUTURE) ×2 IMPLANT
SUT SILK 2 0 FSL 18 (SUTURE) IMPLANT
SUT VIC AB 0 CTX 36 (SUTURE) ×2
SUT VIC AB 0 CTX36XBRD ANBCTRL (SUTURE) ×2 IMPLANT
SUT VIC AB 1 CT1 36 (SUTURE) ×4 IMPLANT
SYR 20CC LL (SYRINGE) ×2 IMPLANT
TOWEL OR 17X24 6PK STRL BLUE (TOWEL DISPOSABLE) ×2 IMPLANT
TRAY FOLEY CATH 14FR (SET/KITS/TRAYS/PACK) ×2 IMPLANT
WATER STERILE IRR 1000ML POUR (IV SOLUTION) ×2 IMPLANT

## 2013-04-26 NOTE — Progress Notes (Signed)
04/26/13 2215  Vitals  BP ! 171/101 mmHg  MAP (mmHg) 118  Pulse Rate 73  ECG Heart Rate 74  Resp 17  Oxygen Therapy  SpO2 99 %  increased Labetalol gtt up to 1mg /min

## 2013-04-26 NOTE — Transfer of Care (Signed)
Immediate Anesthesia Transfer of Care Note  Patient: Megan Baxter  Procedure(s) Performed: Procedure(s): CESAREAN SECTION (N/A)  Patient Location: PACU  Anesthesia Type:Spinal  Level of Consciousness: awake, alert  and oriented  Airway & Oxygen Therapy: Patient Spontanous Breathing  Post-op Assessment: Report given to PACU RN and Post -op Vital signs reviewed and stable  Post vital signs: Reviewed and stable  Complications: No apparent anesthesia complications

## 2013-04-26 NOTE — Progress Notes (Signed)
OOB to BR-BP cuff off

## 2013-04-26 NOTE — Anesthesia Postprocedure Evaluation (Signed)
Anesthesia Post Note  Patient: Megan Baxter  Procedure(s) Performed: Procedure(s) (LRB): CESAREAN SECTION (N/A)  Anesthesia type: Spinal  Patient location: PACU  Post pain: Pain level controlled  Post assessment: Post-op Vital signs reviewed  Last Vitals:  Filed Vitals:   04/26/13 1345  BP: 187/102  Pulse: 102  Temp:   Resp:     Post vital signs: Reviewed  Level of consciousness: awake  Complications: No apparent anesthesia complications

## 2013-04-26 NOTE — OR Nursing (Addendum)
Called for pt @1412  for stat c/s was told by MAU  pt not ready yet. Pt arrived @1433 . No interpretor brought with her from MAU Dr. Estanislado Pandy interpreted for pt in french for OR staff

## 2013-04-26 NOTE — H&P (Signed)
Megan Baxter is a 42 y.o. female,  G3P2A0 at 36+ 4 weeks, sent to MAU by Dr Claudean Severance after consultation for growth restriction due to elevated BP.  Interview in french with husband and interpreter present.  Patient has CHTN and was on medication prior to pregnancy ( unsure of which medication and was prescribed in [place]). Was prescribed Labetalol 100 mg BID in June but did not take.  Patient was initially followed at [facility] and transferred her care to [facility] on 04/13/13 at 34+5 weeks with BP reading 148/74 and 130/90. Currently patient denies headache, visual symptoms, epigastric pain other than her usual GERD. She reports good fetal movement. She denies contractions, leaking of fluid or bleeding.  OB History   Grav Para Term Preterm Abortions TAB SAB Ect Mult Living   3 2 2  0 0 0 0 0 0 2     Past Medical History  Diagnosis Date  . Hypertension   . Heartburn in pregnancy    Past Surgical History  Procedure Laterality Date  . No past surgeries     Family History: family history includes Hypertension in her father. There is no history of Alcohol abuse, Arthritis, Asthma, Birth defects, Cancer, COPD, Depression, Diabetes, Drug abuse, Early death, Hearing loss, Heart disease, Hyperlipidemia, Kidney disease, Learning disabilities, Mental illness, Mental retardation, Miscarriages / Stillbirths, Stroke, or Vision loss. Social History:  reports that she has never smoked. She does not have any smokeless tobacco history on file. She reports that she does not drink alcohol or use illicit drugs.   Prenatal labs: ABO, Rh:  B+ Antibody:  negative Rubella:  immune RPR:   NR HBsAg:   negative HIV:   negative GBS:    PIH labs 12/2012: all WNL   Prenatal Transfer Tool  Maternal Diabetes: No failed 1 hour GTT at 167 with normal 3 hour GTT Genetic Screening: Declined Maternal Ultrasounds/Referrals: Abnormal:  Findings:   Other: overall weight at 13 % today with AC growth lag at 6 % . BPP 8/8 with  normal dopplers Fetal Ultrasounds or other Referrals:  Referred to [facility] Fetal Medicine  Dr Claudean Severance Maternal Substance Abuse:  No Significant Maternal Medications:  None Significant Maternal Lab Results: None     Blood pressure 190/114, pulse 83, temperature 98.5 F (36.9 C), temperature source Oral, resp. rate 18, height 5\' 5"  (1.651 m), weight 160 lb (72.576 kg), last menstrual period 08/13/2012.  After Labetalol 20 mg IV: 190/114  General Appearance: Alert, appropriate appearance for age. No acute distress HEENT Exam: Grossly normal Chest/Respiratory Exam: Normal chest wall and respirations. Clear to auscultation  Cardiovascular Exam: Regular rate and rhythm. S1, S2, no murmur Gastrointestinal Exam: soft, non-tender, Uterus gravid with size compatible with GA, Vertex presentation by Leopold's maneuvers Psychiatric Exam: Alert and oriented, appropriate affect Lower extremities: no edema, DTR 2/4 no clonus  ++++++++++++++++++++++++++++++++++++++++++++++++++++++++++++++++  Vaginal exam: closed / 50 %/ -3  Fetal tracings: Category 1 BPP: 8/8 today Dopplers: normal  PIH labs all normal with the exception of increased uric acid at 6.2. Protein/creatinine ratio still pending  ++++++++++++++++++++++++++++++++++++++++++++++++++++++++++++++++   Assessment/Plan:  36+ 4 weeks  Hypertensive crisis   Patient has now received Labetalol 60 mg IV and Apresoline 10 mg IV with no change in BP. Reviewed with Dr Claudean Severance who recommends to proceed with delivery by cesarean section since cervix is unfavorable. Also agrees with B-blocker drip. Reviewed with anesthesia who agrees with labetalol drip.  Recommendations reviewed with patient and husband who are agreeable to proceed after  all questions answered.  Cesarean section reviewed with pt with R&B including but not limited to:  bleeding, infection, injury to other organs. Low transverse approach planned which will allow vaginal  delivery with future pregnancies. Should a vertical incision or inverted T be needed, patient is aware that repeat cesarean sections would be recommended in the future. Expected hospital stay and recovery also discussed.   Silverio Lay MD 04/26/2013, 12:28 PM

## 2013-04-26 NOTE — MAU Note (Signed)
Sent from MFM to be evaluated for hypertension;

## 2013-04-26 NOTE — Progress Notes (Signed)
04/26/13 2300  Vitals  BP ! 170/98 mmHg  MAP (mmHg) 116  Pulse Rate 85  ECG Heart Rate 87  Resp 18  Oxygen Therapy  SpO2 99 %  increased labetalol gtt to 2 gm/min

## 2013-04-26 NOTE — Anesthesia Preprocedure Evaluation (Signed)
Anesthesia Evaluation  Patient identified by MRN, date of birth, ID band Patient awake    Reviewed: Allergy & Precautions, H&P , NPO status , Patient's Chart, lab work & pertinent test results  Airway Mallampati: I TM Distance: >3 FB Neck ROM: full    Dental no notable dental hx.    Pulmonary neg pulmonary ROS,  breath sounds clear to auscultation  Pulmonary exam normal       Cardiovascular hypertension, Pt. on home beta blockers     Neuro/Psych negative neurological ROS  negative psych ROS   GI/Hepatic negative GI ROS, Neg liver ROS,   Endo/Other  negative endocrine ROS  Renal/GU negative Renal ROS     Musculoskeletal negative musculoskeletal ROS (+)   Abdominal Normal abdominal exam  (+)   Peds  Hematology negative hematology ROS (+)   Anesthesia Other Findings   Reproductive/Obstetrics (+) Pregnancy                           Anesthesia Physical Anesthesia Plan  ASA: III and emergent  Anesthesia Plan:    Post-op Pain Management:    Induction:   Airway Management Planned:   Additional Equipment:   Intra-op Plan:   Post-operative Plan:   Informed Consent: I have reviewed the patients History and Physical, chart, labs and discussed the procedure including the risks, benefits and alternatives for the proposed anesthesia with the patient or authorized representative who has indicated his/her understanding and acceptance.     Plan Discussed with: CRNA and Surgeon  Anesthesia Plan Comments:         Anesthesia Quick Evaluation

## 2013-04-26 NOTE — Progress Notes (Signed)
Dr Lilli Light aware of B/P. Will bolus with Labetatol IV push and then follow with titrating labetalol gtt.

## 2013-04-26 NOTE — Op Note (Signed)
Preoperative diagnosis: Intrauterine pregnancy at 36 weeks and 4 days with hypertensive crisis unresponsive to medical treatment  Post operative diagnosis: Same  Anesthesia: Spinal  Anesthesiologist: Dr. Arby Barrette  Procedure: Primary low transverse cesarean section  Surgeon: Dr. Dois Davenport Odis Wickey  Assistant: none  Estimated blood loss: 600 cc  Procedure:  After being informed of the planned procedure and possible complications including bleeding, infection, injury to other organs, informed consent is obtained. The patient is taken to OR #1 and given spinal anesthesia without complication. She is placed in the dorsal decubitus position with the pelvis tilted to the left. She is then prepped and draped in a sterile fashion. A Foley catheter is inserted in her bladder.  After assessing adequate level of anesthesia, we infiltrate the suprapubic area with 20 cc of Marcaine 0.25 and perform a Pfannenstiel incision which is brought down sharply to the fascia. The fascia is entered in a low transverse fashion. Linea alba is dissected. Peritoneum is entered in a midline fashion. An Alexis retractor is easily positioned.   The myometrium is then entered in a low transverse fashion, 2 cm above the vesico-uterine junction ; first with knife and then extended bluntly. Amniotic fluid is clear. We assist the birth of a female  infant in vertex presentation. Mouth and nose are suctioned. The baby is delivered. The cord is clamped and sectioned. The baby is given to the neonatologist present in the room.  Cord blood gas is drawn from the umbilical artery and 10 cc of blood is drawn from the umbilical vein.The placenta is allowed to deliver spontaneously. It is complete and the cord has 3 vessels. Uterine revision is negative.  We proceed with closure of the myometrium in 2 layers: First with a running locked suture of 0 Vicryl, then with a Lembert suture of 0 Vicryl imbricating the first one. Hemostasis is  completed with cauterization on peritoneal edges.  Both paracolic gutters are cleaned. Both tubes and ovaries are assessed and normal. The pelvis is profusely irrigated with warm saline to confirm a satisfactory hemostasis.  Retractors and sponges are removed. Under fascia hemostasis is completed with cauterization. The fascia is then closed with 2 running sutures of 0 Vicryl meeting midline. The wound is irrigated with warm saline and hemostasis is completed with cauterization. The skin is closed with a subcuticular suture of 3-0 Monocryl and Steri-Strips.  Instrument and sponge count is complete x2. Estimated blood loss is 600 cc.  The procedure is well tolerated by the patient who is taken to recovery room in a well and stable condition.  female baby named Margaretmary Lombard was born at 15:08, weighed 4 lbs 10 oz  and received an Apgar of 5  at 1 minute, 5 at 5 minutes and 7 at 10 minutes. She was taken to NICU for episodes of apnea.Cord pH pending.   Specimen: Placenta sent to L & D   Peyten Weare A MD 10/1/20143:41 PM

## 2013-04-26 NOTE — Progress Notes (Signed)
04/26/13 1915  Vitals  BP ! 163/84 mmHg  MAP (mmHg) 102  Pulse Rate 82  Oxygen Therapy  SpO2 99 %  increased Labetalol gtt to 1 mg/min

## 2013-04-26 NOTE — Anesthesia Procedure Notes (Signed)
Spinal  Patient location during procedure: OR Start time: 04/26/2013 2:41 PM End time: 04/26/2013 2:45 PM Staffing Anesthesiologist: Sandrea Hughs Performed by: anesthesiologist  Preanesthetic Checklist Completed: patient identified, surgical consent, pre-op evaluation, timeout performed, IV checked, risks and benefits discussed and monitors and equipment checked Spinal Block Patient position: sitting Prep: DuraPrep Patient monitoring: heart rate, cardiac monitor, continuous pulse ox and blood pressure Approach: midline Location: L3-4 Injection technique: single-shot Needle Needle type: Sprotte  Needle gauge: 24 G Needle length: 9 cm Needle insertion depth: 5 cm Assessment Sensory level: T4

## 2013-04-26 NOTE — Progress Notes (Signed)
Megan Baxter  was seen today for an ultrasound appointment.  See full report in [facility].  Comments: Ms. Brillhart reports a history of chronic hypertension - was on blood pressure medications before she came the the [place].  She was ordered Labetalol 100 mg BID earlier this pregnancy, but has never taken any medications for blood pressure at any time this pregnancy.  BPs: 169/110, repeat 151/105  Impression: Single IUP at 36 4/7 weeks Estimated fetal growth today is at the 13th %tile.  The AC measures at the 6th %tile. Somewhat limited views of the fetal anatomy obtained due to late gestational age No gross anomalies identified UA Dopplers normal for gestational age  BPP 57/8 Normal amniotic fluid volume  Recommendations: Case discussed with Dr.Rivard. Patient was sent to MAU for further evaluation, preeclampsia labs - rule out superimposed preeclampsia. If BPs improve with medication and no other indications for immediate delivery identified, would offer admission for observation and 24-hr urine collection.  If there is no evidence of superimposed preeclampsia and patient is deemed a candidate for outpatient management,  would recommend 2x weekly NSTs with weekly AFIs and UA Doppler studies and would recommend delivery by [redacted] weeks gestation.  Alpha Gula, MD

## 2013-04-27 ENCOUNTER — Encounter (HOSPITAL_COMMUNITY): Payer: Self-pay | Admitting: Obstetrics and Gynecology

## 2013-04-27 LAB — COMPREHENSIVE METABOLIC PANEL
AST: 12 U/L (ref 0–37)
BUN: 5 mg/dL — ABNORMAL LOW (ref 6–23)
CO2: 21 mEq/L (ref 19–32)
Calcium: 9.5 mg/dL (ref 8.4–10.5)
Chloride: 100 mEq/L (ref 96–112)
Creatinine, Ser: 0.49 mg/dL — ABNORMAL LOW (ref 0.50–1.10)
GFR calc Af Amer: >90 mL/min (ref >90)
GFR calc non Af Amer: >90 mL/min (ref >90)
Glucose, Bld: 125 mg/dL — ABNORMAL HIGH (ref 70–99)
Total Bilirubin: 0.4 mg/dL (ref 0.3–1.2)
Total Protein: 5.9 g/dL — ABNORMAL LOW (ref 6.0–8.3)

## 2013-04-27 LAB — PROTEIN / CREATININE RATIO, URINE: Protein Creatinine Ratio: 0.11 (ref 0.00–0.15)

## 2013-04-27 LAB — CBC
HCT: 31.3 % — ABNORMAL LOW (ref 36.0–46.0)
Hemoglobin: 10.6 g/dL — ABNORMAL LOW (ref 12.0–15.0)
MCHC: 33.9 g/dL (ref 30.0–36.0)
MCV: 78.1 fL (ref 78.0–100.0)
RBC: 4.01 MIL/uL (ref 3.87–5.11)
WBC: 9.7 10*3/uL (ref 4.0–10.5)

## 2013-04-27 LAB — LACTATE DEHYDROGENASE: LDH: 83 U/L — ABNORMAL LOW (ref 94–250)

## 2013-04-27 LAB — URIC ACID: Uric Acid, Serum: 6 mg/dL (ref 2.4–7.0)

## 2013-04-27 MED ORDER — INFLUENZA VAC SPLIT QUAD 0.5 ML IM SUSP
0.5000 mL | INTRAMUSCULAR | Status: AC
  Administered 2013-04-27: 0.5 mL via INTRAMUSCULAR
  Filled 2013-04-27: qty 0.5

## 2013-04-27 NOTE — Progress Notes (Signed)
04/27/13 0245  Vitals  BP 124/75 mmHg  MAP (mmHg) 87  Pulse Rate 79  ECG Heart Rate 78  Resp ! 22  Oxygen Therapy  SpO2 100 %  Labetalol gtt d/c'd

## 2013-04-27 NOTE — Progress Notes (Signed)
Family is doing well and reports that their baby is doing well as well.  They did not have any particular needs at this time, but were grateful that we checked on them.  [address] [place] Pager, [phone] 11:55 AM   04/27/13 1100  Clinical Encounter Type  Visited With Patient and family together  Visit Type Initial

## 2013-04-27 NOTE — Progress Notes (Signed)
Pt has transfer order - waiting for NICU decision on transfer of baby to CN to determine where pt will be transferred to.

## 2013-04-27 NOTE — Progress Notes (Addendum)
Post Partum Day 1 cesarean section  Subjective: The patient does not speak [place]. We were able to communicate however. She is tolerating a regular diet. Her pain is well-controlled.  Objective: Blood pressure 127/76, pulse 69, temperature 97.7 F (36.5 C), temperature source Oral, resp. rate 16, height 5\' 5"  (1.651 m), weight 157 lb 7 oz (71.413 kg), last menstrual period 08/13/2012, SpO2 99.00%, unknown if currently breastfeeding.  Physical Exam:  General: alert, cooperative and no distress Lochia: appropriate Uterine Fundus: firm Incision: Dressing is clean and dry DVT Evaluation: No evidence of DVT seen on physical exam. Chest: Clear Heart: Regular rate and rhythm  Urine output: 500 cc in 2 hours  Results for orders placed during the hospital encounter of 04/26/13 (from the past 24 hour(s))  URINALYSIS, ROUTINE W REFLEX MICROSCOPIC     Status: None   Collection Time    04/26/13 10:45 AM      Result Value Range   Color, Urine YELLOW  YELLOW   APPearance CLEAR  CLEAR   Specific Gravity, Urine 1.010  1.005 - 1.030   pH 7.0  5.0 - 8.0   Glucose, UA NEGATIVE  NEGATIVE mg/dL   Hgb urine dipstick NEGATIVE  NEGATIVE   Bilirubin Urine NEGATIVE  NEGATIVE   Ketones, ur NEGATIVE  NEGATIVE mg/dL   Protein, ur NEGATIVE  NEGATIVE mg/dL   Urobilinogen, UA 0.2  0.0 - 1.0 mg/dL   Nitrite NEGATIVE  NEGATIVE   Leukocytes, UA NEGATIVE  NEGATIVE  PROTEIN / CREATININE RATIO, URINE     Status: None   Collection Time    04/26/13 10:45 AM      Result Value Range   Creatinine, Urine 20.68     Total Protein, Urine <4     PROTEIN CREATININE RATIO         0.00 - 0.15  CBC     Status: Abnormal   Collection Time    04/26/13 11:40 AM      Result Value Range   WBC 7.5  4.0 - 10.5 K/uL   RBC 4.39  3.87 - 5.11 MIL/uL   Hemoglobin 11.5 (*) 12.0 - 15.0 g/dL   HCT 16.1 (*) 09.6 - 04.5 %   MCV 77.9 (*) 78.0 - 100.0 fL   MCH 26.2  26.0 - 34.0 pg   MCHC 33.6  30.0 - 36.0 g/dL   RDW 40.9  81.1 -  91.4 %   Platelets 350  150 - 400 K/uL  COMPREHENSIVE METABOLIC PANEL     Status: Abnormal   Collection Time    04/26/13 11:40 AM      Result Value Range   Sodium 135  135 - 145 mEq/L   Potassium 3.9  3.5 - 5.1 mEq/L   Chloride 102  96 - 112 mEq/L   CO2 20  19 - 32 mEq/L   Glucose, Bld 68 (*) 70 - 99 mg/dL   BUN 4 (*) 6 - 23 mg/dL   Creatinine, Ser 7.82 (*) 0.50 - 1.10 mg/dL   Calcium 9.5  8.4 - 95.6 mg/dL   Total Protein 7.0  6.0 - 8.3 g/dL   Albumin 3.1 (*) 3.5 - 5.2 g/dL   AST 10  0 - 37 U/L   ALT 6  0 - 35 U/L   Alkaline Phosphatase 166 (*) 39 - 117 U/L   Total Bilirubin 0.4  0.3 - 1.2 mg/dL   GFR calc non Af Amer >90  >90 mL/min   GFR calc Af Amer >  90  >90 mL/min  URIC ACID     Status: None   Collection Time    04/26/13 11:40 AM      Result Value Range   Uric Acid, Serum 6.2  2.4 - 7.0 mg/dL  LACTATE DEHYDROGENASE     Status: Abnormal   Collection Time    04/26/13 11:40 AM      Result Value Range   LDH 63 (*) 94 - 250 U/L  PROTEIN / CREATININE RATIO, URINE     Status: None   Collection Time    04/27/13  5:15 AM      Result Value Range   Creatinine, Urine 63.70     Total Protein, Urine 7.2     PROTEIN CREATININE RATIO 0.11  0.00 - 0.15  CBC     Status: Abnormal   Collection Time    04/27/13  5:30 AM      Result Value Range   WBC 9.7  4.0 - 10.5 K/uL   RBC 4.01  3.87 - 5.11 MIL/uL   Hemoglobin 10.6 (*) 12.0 - 15.0 g/dL   HCT 62.9 (*) 52.8 - 41.3 %   MCV 78.1  78.0 - 100.0 fL   MCH 26.4  26.0 - 34.0 pg   MCHC 33.9  30.0 - 36.0 g/dL   RDW 24.4  01.0 - 27.2 %   Platelets 324  150 - 400 K/uL  COMPREHENSIVE METABOLIC PANEL     Status: Abnormal   Collection Time    04/27/13  5:30 AM      Result Value Range   Sodium 131 (*) 135 - 145 mEq/L   Potassium 3.7  3.5 - 5.1 mEq/L   Chloride 100  96 - 112 mEq/L   CO2 21  19 - 32 mEq/L   Glucose, Bld 125 (*) 70 - 99 mg/dL   BUN 5 (*) 6 - 23 mg/dL   Creatinine, Ser 5.36 (*) 0.50 - 1.10 mg/dL   Calcium 9.5  8.4 - 64.4  mg/dL   Total Protein 5.9 (*) 6.0 - 8.3 g/dL   Albumin 2.3 (*) 3.5 - 5.2 g/dL   AST 12  0 - 37 U/L   ALT 6  0 - 35 U/L   Alkaline Phosphatase 121 (*) 39 - 117 U/L   Total Bilirubin 0.4  0.3 - 1.2 mg/dL   GFR calc non Af Amer >90  >90 mL/min   GFR calc Af Amer >90  >90 mL/min  URIC ACID     Status: None   Collection Time    04/27/13  5:30 AM      Result Value Range   Uric Acid, Serum 6.0  2.4 - 7.0 mg/dL  LACTATE DEHYDROGENASE     Status: Abnormal   Collection Time    04/27/13  5:30 AM      Result Value Range   LDH 83 (*) 94 - 250 U/L      Recent Labs  04/26/13 1140 04/27/13 0530  HGB 11.5* 10.6*  HCT 34.2* 31.3*    Assessment/Plan: Improved blood pressures. No longer on IV labetalol. Anemia Doing well day 1 status post cesarean section I believe it is appropriate for the patient to go to the postpartum floor.   LOS: 1 day   Jendayi Berling V 04/27/2013, 8:40 AM

## 2013-04-27 NOTE — Progress Notes (Addendum)
Kennyth Lose [place] interpreter # [phone] used to inform pt of POC, do education, gain information from pt & for pt to ask questions.

## 2013-04-27 NOTE — Progress Notes (Signed)
Pt transferred ambulatory to Rm #[id number].

## 2013-04-27 NOTE — Progress Notes (Signed)
UR chart review completed.  

## 2013-04-27 NOTE — Progress Notes (Signed)
04/27/13 0145  Vitals  BP 139/86 mmHg  MAP (mmHg) 98  Pulse Rate 73  ECG Heart Rate 71  Resp 18  Oxygen Therapy  SpO2 99 %  decreased Labetalol gtt to 1 gm/min

## 2013-04-28 LAB — URINALYSIS, DIPSTICK ONLY
Bilirubin Urine: NEGATIVE
Leukocytes, UA: NEGATIVE
Nitrite: NEGATIVE
Protein, ur: NEGATIVE mg/dL
Urobilinogen, UA: 0.2 mg/dL (ref 0.0–1.0)

## 2013-04-28 MED ORDER — DIPHENHYDRAMINE HCL 25 MG PO CAPS
25.0000 mg | ORAL_CAPSULE | Freq: Once | ORAL | Status: AC
  Administered 2013-04-28: 25 mg via ORAL
  Filled 2013-04-28: qty 1

## 2013-04-28 MED ORDER — NIFEDIPINE ER 60 MG PO TB24
120.0000 mg | ORAL_TABLET | Freq: Every day | ORAL | Status: DC
  Administered 2013-04-29: 120 mg via ORAL
  Filled 2013-04-28 (×2): qty 2

## 2013-04-28 MED ORDER — NIFEDIPINE ER 30 MG PO TB24
30.0000 mg | ORAL_TABLET | Freq: Every day | ORAL | Status: AC
  Administered 2013-04-28: 30 mg via ORAL
  Filled 2013-04-28: qty 1

## 2013-04-28 MED ORDER — ZOLPIDEM TARTRATE 5 MG PO TABS: 5.0000 mg | ORAL_TABLET | Freq: Once | ORAL | Status: DC

## 2013-04-28 MED ORDER — NIFEDIPINE ER 60 MG PO TB24
60.0000 mg | ORAL_TABLET | Freq: Every day | ORAL | Status: DC
  Administered 2013-04-28: 60 mg via ORAL
  Filled 2013-04-28 (×2): qty 1

## 2013-04-28 NOTE — Progress Notes (Signed)
Husband of patient present in room, patient encouraged to increase fluid intake.  Gave 2 Percocet for abdominal pain of 7 out of 10.

## 2013-04-28 NOTE — Progress Notes (Signed)
interpretor #[id number] thru [employer]

## 2013-04-28 NOTE — Progress Notes (Addendum)
Subjective: Postpartum Day 2: Cesarean Delivery due to Hypertensive Crisis. Patient up ad lib, reports no syncope or dizziness. Feeding:  Bottle feeding Contraceptive plan:  Unknown at this time  Objective: Vital signs in last 24 hours: Temp:  [97.6 F (36.4 C)-98.8 F (37.1 C)] 98.6 F (37 C) (10/03 0542) Pulse Rate:  [72-102] 102 (10/03 0542) Resp:  [16-18] 18 (10/03 0542) BP: (120-163)/(70-94) 163/94 mmHg (10/03 0542) SpO2:  [100 %] 100 % (10/03 0542)  Physical Exam:  General: alert, cooperative and no distress Lochia: appropriate Uterine Fundus: firm Incision: healing well DVT Evaluation: No evidence of DVT seen on physical exam. Negative Homan's sign. JP drain:   n/a   Recent Labs  04/26/13 1140 04/27/13 0530  HGB 11.5* 10.6*  HCT 34.2* 31.3*   Filed Vitals:   04/27/13 1722 04/27/13 1756 04/27/13 2140 04/28/13 0542  BP: 120/70 149/81 142/85 163/94  Pulse: 78 81 85 102  Temp: 97.6 F (36.4 C) 98.8 F (37.1 C) 98.2 F (36.8 C) 98.6 F (37 C)  TempSrc: Oral Oral Oral Oral  Resp: 18 18 16 18   Height:      Weight:      SpO2:  100% 100% 100%    Assessment/Plan: Status post Cesarean section day 2. Doing well postoperatively.  BP still elevated on Procardia 60mg  QD Pt requests to go home today d/t tomorrow being a holiday for her and her family  Continue current care. C/w Dr Estanislado Pandy for further management    Megan Baxter 04/28/2013, 8:43 AM  Seen and agreed BP still elevated: increased Procardia XL to 90 mg today Pt c/o dysuria: U/A and culture ordered Will plan [facility] nurse visit on Monday to check BP

## 2013-04-28 NOTE — Discharge Summary (Signed)
Cesarean Section Delivery Discharge Summary  Megan Baxter  DOB:    [birth year] MRN:    [id number] CSN:    [id number]  Date of admission:                  04/26/13  Date of discharge:                   04/29/13  Procedures this admission:  Date of Delivery: 04/26/13  Newborn Data:  Live born female  Birth Weight: 4 lb 10.7 oz (2118 g) APGAR: 5, 5  In NICU, but may be able to be d/c'd 04/29/13 based on Neo assessment.    History of Present Illness:  Ms. Megan Baxter is a 42 y.o. female, [id number], who presents at [redacted]w[redacted]d weeks gestation. The patient has been followed at the [facility] of [facility].    Her pregnancy has been complicated by:  Patient Active Problem List   Diagnosis Date Noted  . Cesarean delivery delivered 04/28/2013  . IUGR (intrauterine growth restriction) 04/28/2013  . Hypertensive crisis 04/26/2013  . HTN (hypertension) 10/26/2012    Hospital course:  The patient was admitted for elevated BP on 04/26/13, with a hx of chronic hypertension.  Labs were done to r/o pre-eclampsia, with all labs WNL, other than uric acid of 6.2.  However, her BP remained severely elevated despite IV Labetalol and Apresoline.  MFM was consulted, and the recommendation was made for primary cesarean delivery due to severe hypertensive crisis and unfavorable cervix.  She was consented for C/S by Dr. Estanislado Pandy, due to patient French-speaking.  Baby had 5,5,7 Apgars, with an apneic spell soon after delivery and was taken to NICU for further assessment and care.  The patient tolerated the procedure well, and was taken to AICU to continue her postpartum care--she remained on the Labetalol drip, which was d/c'd on PP day 1.  She was placed on po Procardia XL, eventually requiring 120 mg dose.  PP CBC was WNL. Her postpartum course was complicated by the need to titrate her po Procardia dose several times.   She had hoped to go home on day 2  postop, but due to her BP still being elevated, she was maintained in the hospital until day 3.  On day 2, she did have a temp of 100.4 at 4:53pm, but was afebrile through the night.  She was discharged to home on postpartum day 3 doing well.  Feeding:  bottle  Contraception:  Undecided.  Discharge hemoglobin:  Hemoglobin  Date Value Range Status  04/27/2013 10.6* 12.0 - 15.0 g/dL Final  04/02/2951 9.5* 84.1 - 16.2 g/dL Final     HCT  Date Value Range Status  04/27/2013 31.3* 36.0 - 46.0 % Final     HCT, POC  Date Value Range Status  10/26/2012 30.6* 37.7 - 47.9 % Final    Discharge Physical Exam:   General: alert Lochia: appropriate Uterine Fundus: firm Incision: Honeycomb dressing CDI. DVT Evaluation: No evidence of DVT seen on physical exam. Negative Homan's sign.  Intrapartum Procedures: cesarean: low cervical, transverse, primary. Postpartum Procedures: IV Labetalol Complications-Operative and Postpartum: Severe hypertension  Discharge Diagnoses: Primary LTCS at 36 4/7 weeks, chronic hypertension, hypertensive crisis  Discharge Information:  Activity:           Per [facility] handout Diet:                routine Medications: Ibuprofen, Percocet and Procardia XL 120  mg daily Condition:      stable Instructions:  Discharge to: home Needs [facility] nurse home at the beginning of the week for BP check.     Nigel Bridgeman 04/28/2013

## 2013-04-29 MED ORDER — OXYCODONE-ACETAMINOPHEN 5-325 MG PO TABS: 1.0000 | ORAL_TABLET | ORAL | Status: DC | PRN

## 2013-04-29 MED ORDER — NIFEDIPINE ER 60 MG PO TB24: 120.0000 mg | ORAL_TABLET | Freq: Every day | ORAL | Status: DC

## 2013-04-29 MED ORDER — IBUPROFEN 600 MG PO TABS: 600.0000 mg | ORAL_TABLET | Freq: Four times a day (QID) | ORAL | Status: DC | PRN

## 2013-04-29 NOTE — Progress Notes (Addendum)
Subjective: Postpartum Day 3: Cesarean Delivery due to hypertensive crisis, hx chronic hypertension. Primary language--French Patient up ad lib, reports no syncope or dizziness. Feeding:  Bottle Contraceptive plan:  Undecided  Objective: Vital signs in last 24 hours: Temp:  [98.6 F (37 C)-100.4 F (38 C)] 99.7 F (37.6 C) (10/04 0635) Pulse Rate:  [88-130] 105 (10/04 0635) Resp:  [18-22] 20 (10/04 0635) BP: (138-169)/(89-100) 145/92 mmHg (10/04 0635)  Filed Vitals:   04/28/13 1300 04/28/13 1653 04/28/13 1905 04/29/13 0635  BP: 169/100 150/91 138/89 145/92  Pulse: 97 130 118 105  Temp: 98.7 F (37.1 C) 100.4 F (38 C) 98.8 F (37.1 C) 99.7 F (37.6 C)  TempSrc: Oral Oral Oral Oral  Resp: 20 22 20 20   Height:      Weight:      SpO2:      Temp max 100.4 at 4:53pm Received Procardia XL 60 mg at 10am and additional Procardia XL 30 mg at 2:46pm.   Physical Exam:  General: alert Lochia: appropriate Uterine Fundus: firm Incision: Honeycomb dressing CDI DVT Evaluation: No evidence of DVT seen on physical exam. Negative Homan's sign. JP drain:   NA   Recent Labs  04/26/13 1140 04/27/13 0530  HGB 11.5* 10.6*  HCT 34.2* 31.3*    Assessment/Plan: Status post Cesarean section day 3. Hoping for d/c today  To receive Procardia XL 120 mg today at 10am Will consult with Dr. Estanislado Pandy regarding d/c plan.   Nigel Bridgeman 04/29/2013, 7:37 AM  Addendum: Consulted with Dr. Estanislado Pandy. Will plan to d/c patient today on Procardia XL 120 mg daily.  Rx sent to pharmacy, along with Ibuprophen.  Rx Percocet via written Rx. [facility] nurse to see patient on Monday or Tuesday for BP check, with results to be called to [facility]. Sanda Klein, CNM, will see patient prior to d/c to review d/c instructions via [employer]/interpreter. Baby to be d/c'd home today.  Nigel Bridgeman, CNM 04/29/13 10:25a

## 2013-04-29 NOTE — Progress Notes (Signed)
Reviewed discharge instructions with patient and husband, husband speaks [place] well and he was able to translate. [facility] nurse will be set up to check BP on Monday Pt instructed to call office Monday to get appointment for NEXT Monday in the office for BP check and in 6weeks for PP visit.  Pt undecided about any contraception, briefly reviewed options.  Pt discharged home in stable condition  S.Carnisha Feltz, CNM

## 2013-05-01 LAB — URINE CULTURE

## 2013-08-21 ENCOUNTER — Emergency Department (HOSPITAL_COMMUNITY)
Admission: EM | Admit: 2013-08-21 | Discharge: 2013-08-21 | Disposition: A | Discharge status: Home / Self Care | Payer: BC Managed Care – PPO | Source: Home / Self Care

## 2013-08-21 ENCOUNTER — Encounter (HOSPITAL_COMMUNITY): Payer: Self-pay | Admitting: Emergency Medicine

## 2013-08-21 DIAGNOSIS — I10 Essential (primary) hypertension: Secondary | ICD-10-CM

## 2013-08-21 DIAGNOSIS — M549 Dorsalgia, unspecified: Secondary | ICD-10-CM

## 2013-08-21 DIAGNOSIS — T148XXA Other injury of unspecified body region, initial encounter: Secondary | ICD-10-CM

## 2013-08-21 DIAGNOSIS — G8929 Other chronic pain: Secondary | ICD-10-CM

## 2013-08-21 MED ORDER — NIFEDIPINE ER OSMOTIC RELEASE 60 MG PO TB24: ORAL_TABLET | ORAL | Status: DC

## 2013-08-21 MED ORDER — NAPROXEN 375 MG PO TABS: 375.0000 mg | ORAL_TABLET | Freq: Two times a day (BID) | ORAL | Status: DC

## 2013-08-21 NOTE — Discharge Instructions (Signed)
Back Pain, Adult Low back pain is very common. About 1 in 5 people have back pain.The cause of low back pain is rarely dangerous. The pain often gets better over time.About half of people with a sudden onset of back pain feel better in just 2 weeks. About 8 in 10 people feel better by 6 weeks.  CAUSES Some common causes of back pain include:  Strain of the muscles or ligaments supporting the spine.  Wear and tear (degeneration) of the spinal discs.  Arthritis.  Direct injury to the back. DIAGNOSIS Most of the time, the direct cause of low back pain is not known.However, back pain can be treated effectively even when the exact cause of the pain is unknown.Answering your caregiver's questions about your overall health and symptoms is one of the most accurate ways to make sure the cause of your pain is not dangerous. If your caregiver needs more information, he or she may order lab work or imaging tests (X-rays or MRIs).However, even if imaging tests show changes in your back, this usually does not require surgery. HOME CARE INSTRUCTIONS For many people, back pain returns.Since low back pain is rarely dangerous, it is often a condition that people can learn to [facility] their own.   Remain active. It is stressful on the back to sit or stand in one place. Do not sit, drive, or stand in one place for more than 30 minutes at a time. Take short walks on level surfaces as soon as pain allows.Try to increase the length of time you walk each day.  Do not stay in bed.Resting more than 1 or 2 days can delay your recovery.  Do not avoid exercise or work.Your body is made to move.It is not dangerous to be active, even though your back may hurt.Your back will likely heal faster if you return to being active before your pain is gone.  Pay attention to your body when you bend and lift. Many people have less discomfortwhen lifting if they bend their knees, keep the load close to their bodies,and  avoid twisting. Often, the most comfortable positions are those that put less stress on your recovering back.  Find a comfortable position to sleep. Use a firm mattress and lie on your side with your knees slightly bent. If you lie on your back, put a pillow under your knees.  Only take over-the-counter or prescription medicines as directed by your caregiver. Over-the-counter medicines to reduce pain and inflammation are often the most helpful.Your caregiver may prescribe muscle relaxant drugs.These medicines help dull your pain so you can more quickly return to your normal activities and healthy exercise.  Put ice on the injured area.  Put ice in a plastic bag.  Place a towel between your skin and the bag.  Leave the ice on for 15-20 minutes, 03-04 times a day for the first 2 to 3 days. After that, ice and heat may be alternated to reduce pain and spasms.  Ask your caregiver about trying back exercises and gentle massage. This may be of some benefit.  Avoid feeling anxious or stressed.Stress increases muscle tension and can worsen back pain.It is important to recognize when you are anxious or stressed and learn ways to manage it.Exercise is a great option. SEEK MEDICAL CARE IF:  You have pain that is not relieved with rest or medicine.  You have pain that does not improve in 1 week.  You have new symptoms.  You are generally not feeling well. SEEK  IMMEDIATE MEDICAL CARE IF:   You have pain that radiates from your back into your legs.  You develop new bowel or bladder control problems.  You have unusual weakness or numbness in your arms or legs.  You develop nausea or vomiting.  You develop abdominal pain.  You feel faint. Document Released: 07/13/2005 Document Revised: 01/12/2012 Document Reviewed: 12/01/2010 [facility] Patient Information 2014 [place], [place].  Hypertension As your heart beats, it forces blood through your arteries. This force is your blood pressure.  If the pressure is too high, it is called hypertension (HTN) or high blood pressure. HTN is dangerous because you may have it and not know it. High blood pressure may mean that your heart has to work harder to pump blood. Your arteries may be narrow or stiff. The extra work puts you at risk for heart disease, stroke, and other problems.  Blood pressure consists of two numbers, a higher number over a lower, 110/72, for example. It is stated as "110 over 72." The ideal is below 120 for the top number (systolic) and under 80 for the bottom (diastolic). Write down your blood pressure today. You should pay close attention to your blood pressure if you have certain conditions such as:  Heart failure.  Prior heart attack.  Diabetes  Chronic kidney disease.  Prior stroke.  Multiple risk factors for heart disease. To see if you have HTN, your blood pressure should be measured while you are seated with your arm held at the level of the heart. It should be measured at least twice. A one-time elevated blood pressure reading (especially in the Emergency Department) does not mean that you need treatment. There may be conditions in which the blood pressure is different between your right and left arms. It is important to see your caregiver soon for a recheck. Most people have essential hypertension which means that there is not a specific cause. This type of high blood pressure may be lowered by changing lifestyle factors such as:  Stress.  Smoking.  Lack of exercise.  Excessive weight.  Drug/tobacco/alcohol use.  Eating less salt. Most people do not have symptoms from high blood pressure until it has caused damage to the body. Effective treatment can often prevent, delay or reduce that damage. TREATMENT  When a cause has been identified, treatment for high blood pressure is directed at the cause. There are a large number of medications to treat HTN. These fall into several categories, and your  caregiver will help you select the medicines that are best for you. Medications may have side effects. You should review side effects with your caregiver. If your blood pressure stays high after you have made lifestyle changes or started on medicines,   Your medication(s) may need to be changed.  Other problems may need to be addressed.  Be certain you understand your prescriptions, and know how and when to take your medicine.  Be sure to follow up with your caregiver within the time frame advised (usually within two weeks) to have your blood pressure rechecked and to review your medications.  If you are taking more than one medicine to lower your blood pressure, make sure you know how and at what times they should be taken. Taking two medicines at the same time can result in blood pressure that is too low. SEEK IMMEDIATE MEDICAL CARE IF:  You develop a severe headache, blurred or changing vision, or confusion.  You have unusual weakness or numbness, or a faint feeling.  You have severe chest or abdominal pain, vomiting, or breathing problems. MAKE SURE YOU:   Understand these instructions.  Will watch your condition.  Will get help right away if you are not doing well or get worse. Document Released: 07/13/2005 Document Revised: 10/05/2011 Document Reviewed: 03/02/2008 [facility] Patient Information 2014 [place].  Muscle Strain A muscle strain is an injury that occurs when a muscle is stretched beyond its normal length. Usually a small number of muscle fibers are torn when this happens. Muscle strain is rated in degrees. First-degree strains have the least amount of muscle fiber tearing and pain. Second-degree and third-degree strains have increasingly more tearing and pain.  Usually, recovery from muscle strain takes 1 2 weeks. Complete healing takes 5 6 weeks.  CAUSES  Muscle strain happens when a sudden, violent force placed on a muscle stretches it too far. This may occur  with lifting, sports, or a fall.  RISK FACTORS Muscle strain is especially common in athletes.  SIGNS AND SYMPTOMS At the site of the muscle strain, there may be:  Pain.  Bruising.  Swelling.  Difficulty using the muscle due to pain or lack of normal function. DIAGNOSIS  Your health care provider will perform a physical exam and ask about your medical history. TREATMENT  Often, the best treatment for a muscle strain is resting, icing, and applying cold compresses to the injured area.  HOME CARE INSTRUCTIONS   Use the PRICE method of treatment to promote muscle healing during the first 2 3 days after your injury. The PRICE method involves:  Protecting the muscle from being injured again.  Restricting your activity and resting the injured body part.  Icing your injury. To do this, put ice in a plastic bag. Place a towel between your skin and the bag. Then, apply the ice and leave it on from 15 20 minutes each hour. After the third day, switch to moist heat packs.  Apply compression to the injured area with a splint or elastic bandage. Be careful not to wrap it too tightly. This may interfere with blood circulation or increase swelling.  Elevate the injured body part above the level of your heart as often as you can.  Only take over-the-counter or prescription medicines for pain, discomfort, or fever as directed by your health care provider.  Warming up prior to exercise helps to prevent future muscle strains. SEEK MEDICAL CARE IF:   You have increasing pain or swelling in the injured area.  You have numbness, tingling, or a significant loss of strength in the injured area. MAKE SURE YOU:   Understand these instructions.  Will watch your condition.  Will get help right away if you are not doing well or get worse. Document Released: 07/13/2005 Document Revised: 05/03/2013 Document Reviewed: 02/09/2013 [facility] Patient Information 2014 [place], [place].

## 2013-08-21 NOTE — ED Provider Notes (Signed)
Medical screening examination/treatment/procedure(s) were performed by non-physician practitioner and as supervising physician I was immediately available for consultation/collaboration.  Philipp Deputy, M.D.   Harden Mo, MD 08/21/13 [phone]

## 2013-08-21 NOTE — ED Provider Notes (Signed)
CSN: [id number]     Arrival date & time 08/21/13  5916 History   First MD Initiated Contact with Patient 08/21/13 1148     Chief Complaint  Patient presents with  . Back Pain   (Consider location/radiation/quality/duration/timing/severity/associated sxs/prior Treatment) HPI Comments: 43 year old female from [place] does not speak English and  is accompanied by her husband who interprets for her. her chief complaint is that of left upper back pain. This is a pain that she has had for over 2 years. She has seen a physician in her country they diagnosed her with muscle pain. Pain is worse with some movements and has worsened since she delivered her baby 3 months ago. She also has heartburn and sometimes exertional shortness of breath. Denies heaviness tightness fullness or pressure. She has a long history of hypertension she has been getting medicines from another urgent care then later a refill from the [facility] hospital. She is out of her medication and states no one else will fill the bottle for her period although she been in the country [place] 2 [place] she has not obtained a PCP.   Past Medical History  Diagnosis Date  . Hypertension   . Heartburn in pregnancy    Past Surgical History  Procedure Laterality Date  . No past surgeries    . Cesarean section N/A 04/26/2013    Procedure: CESAREAN SECTION;  Surgeon: Alwyn Pea, MD;  Location: [facility];  Service: Obstetrics;  Laterality: N/A;   Family History  Problem Relation Age of Onset  . Hypertension Father   . Alcohol abuse Neg Hx   . Arthritis Neg Hx   . Asthma Neg Hx   . Birth defects Neg Hx   . Cancer Neg Hx   . COPD Neg Hx   . Depression Neg Hx   . Diabetes Neg Hx   . Drug abuse Neg Hx   . Early death Neg Hx   . Hearing loss Neg Hx   . Heart disease Neg Hx   . Hyperlipidemia Neg Hx   . Kidney disease Neg Hx   . Learning disabilities Neg Hx   . Mental illness Neg Hx   . Mental retardation Neg Hx   . Miscarriages /  Stillbirths Neg Hx   . Stroke Neg Hx   . Vision loss Neg Hx    History  Substance Use Topics  . Smoking status: Never Smoker   . Smokeless tobacco: Not on file  . Alcohol Use: No   OB History   Grav Para Term Preterm Abortions TAB SAB Ect Mult Living   3 3 2 1  0 0 0 0 0 2     Review of Systems  Constitutional: Negative for fever, chills and diaphoresis.  HENT: Negative.   Respiratory: Positive for shortness of breath. Negative for cough, choking, wheezing and stridor.   Gastrointestinal:       Occasional heartburn. There is also pain at the xiphoid process.  Genitourinary: Negative.   Musculoskeletal: Positive for back pain.  Skin: Negative.   Neurological: Negative.     Allergies  Review of patient's allergies indicates no known allergies.  Home Medications   Current Outpatient Rx  Name  Route  Sig  Dispense  Refill  . ibuprofen (ADVIL,MOTRIN) 600 MG tablet   Oral   Take 1 tablet (600 mg total) by mouth every 6 (six) hours as needed for pain.   36 tablet   2   . labetalol (NORMODYNE) 100 MG tablet  Oral   Take 1 tablet (100 mg total) by mouth 2 (two) times daily.   60 tablet   3   . naproxen (NAPROSYN) 375 MG tablet   Oral   Take 1 tablet (375 mg total) by mouth 2 (two) times daily.   20 tablet   0   . NIFEdipine (PROCARDIA XL) 60 MG 24 hr tablet      Take 2 tabs daily for BP   60 tablet   0   . NIFEdipine (PROCARDIA-XL/ADALAT CC) 60 MG 24 hr tablet   Oral   Take 2 tablets (120 mg total) by mouth daily.   60 tablet   2   . oxyCODONE-acetaminophen (PERCOCET/ROXICET) 5-325 MG per tablet   Oral   Take 1-2 tablets by mouth every 4 (four) hours as needed.   30 tablet   0   . pantoprazole (PROTONIX) 40 MG tablet   Oral   Take 40 mg by mouth daily.          BP 178/114  Pulse 104  Temp(Src) 98.8 F (37.1 C) (Oral)  Resp 18  SpO2 99%  LMP 07/29/2013  Breastfeeding? No Physical Exam  Nursing note and vitals reviewed. Constitutional: She  is oriented to person, place, and time. She appears well-developed and well-nourished. No distress.  HENT:  Mouth/Throat: Oropharynx is clear and moist. No oropharyngeal exudate.  Eyes: Conjunctivae and EOM are normal. Pupils are equal, round, and reactive to light.  Neck: Normal range of motion. Neck supple.  Cardiovascular: Normal rate, regular rhythm, normal heart sounds and intact distal pulses.   No murmur heard. Pulmonary/Chest: Effort normal and breath sounds normal. No respiratory distress. She has no wheezes. She has no rales. She exhibits tenderness.  There is anterior chest wall tenderness along the mid sternum and over the xiphoid process.  Abdominal: Soft. Bowel sounds are normal.  Musculoskeletal: She exhibits tenderness. She exhibits no edema.  Point tenderness over the left medial scapular border. This muscle pain and tenderness is exacerbated by certain movements. There is no bony tenderness, deformity or discoloration. Palpating the muscle produces the same pain that she has when lifting objects and other specific movements.  Lymphadenopathy:    She has no cervical adenopathy.  Neurological: She is alert and oriented to person, place, and time. No cranial nerve deficit. She exhibits normal muscle tone.  Skin: Skin is warm and dry. No rash noted.  Psychiatric: She has a normal mood and affect.    ED Course  Procedures (including critical care time) Labs Review Labs Reviewed - No data to display Imaging Review No results found. EKG: NSR No ectopy, No ischemic changes   MDM   1. Muscle strain   2. Back pain, chronic   3. HTN (hypertension)      Procardia XL 60 mg 2 daily Naprosyn 375 mg twice a day with food when necessary back pain He must obtain a PCP, call the telephone numbers listed on the referral age. Must take your blood pressure medicine daily. 4 chest pain, shortness of breath, headaches or weakness got to the emergency department. Do to language  barrier and difficulty with the assessment 35 minutes less than with the patient.  Janne Napoleon, NP 08/21/13 [place], NP 08/21/13 1249

## 2013-08-21 NOTE — ED Notes (Signed)
Left upper back pain, shoulder blade pain.  History of the same for 2 years.  Intermittent pain.  Evaluated prior to coming to the [place], no diagnosis and though to be stress related.  Has been in [place] x 59 , has a 40 month old baby, predominantly care this past year has been with ob/gyn.  Denies breast feeding .  Patient also did not take blood pressure med this am.

## 2013-09-15 ENCOUNTER — Telehealth (HOSPITAL_COMMUNITY): Payer: Self-pay

## 2013-09-15 NOTE — ED Notes (Signed)
2/6 [facility] called on VM with questions about Naproxen and Nifedapine. Reference # [id number].  I called them back and the pharmacist said pt. was asking for a refill.  I told them we don't do refills because we are not a primary care facility. Pt. would have to come back and see the doctor or call her PCP. Roselyn Meier 09/15/2013

## 2013-10-14 ENCOUNTER — Ambulatory Visit: Payer: BC Managed Care – PPO | Attending: Internal Medicine | Admitting: Internal Medicine

## 2013-10-14 VITALS — BP 184/101 | HR 89 | Temp 98.7°F | Ht 64.0 in | Wt 176.0 lb

## 2013-10-14 DIAGNOSIS — R51 Headache: Secondary | ICD-10-CM | POA: Insufficient documentation

## 2013-10-14 DIAGNOSIS — M549 Dorsalgia, unspecified: Secondary | ICD-10-CM | POA: Insufficient documentation

## 2013-10-14 DIAGNOSIS — Z8249 Family history of ischemic heart disease and other diseases of the circulatory system: Secondary | ICD-10-CM | POA: Insufficient documentation

## 2013-10-14 DIAGNOSIS — Z008 Encounter for other general examination: Secondary | ICD-10-CM | POA: Insufficient documentation

## 2013-10-14 DIAGNOSIS — I10 Essential (primary) hypertension: Secondary | ICD-10-CM | POA: Insufficient documentation

## 2013-10-14 LAB — COMPLETE METABOLIC PANEL WITH GFR
ALBUMIN: 4 g/dL (ref 3.5–5.2)
ALK PHOS: 79 U/L (ref 39–117)
ALT: 14 U/L (ref 0–35)
AST: 10 U/L (ref 0–37)
BUN: 12 mg/dL (ref 6–23)
CHLORIDE: 104 meq/L (ref 96–112)
CO2: 25 mEq/L (ref 19–32)
CREATININE: 0.61 mg/dL (ref 0.50–1.10)
Calcium: 9.7 mg/dL (ref 8.4–10.5)
GFR, Est African American: >89 mL/min
GFR, Est Non African American: >89 mL/min
Glucose, Bld: 116 mg/dL — ABNORMAL HIGH (ref 70–99)
POTASSIUM: 4.8 meq/L (ref 3.5–5.3)
Sodium: 138 mEq/L (ref 135–145)
Total Bilirubin: 0.5 mg/dL (ref 0.2–1.2)
Total Protein: 7 g/dL (ref 6.0–8.3)

## 2013-10-14 LAB — CBC WITH DIFFERENTIAL/PLATELET
BASOS ABS: 0.1 10*3/uL (ref 0.0–0.1)
Basophils Relative: 1 % (ref 0–1)
Eosinophils Absolute: 0.2 10*3/uL (ref 0.0–0.7)
Eosinophils Relative: 3 % (ref 0–5)
HCT: 35.3 % — ABNORMAL LOW (ref 36.0–46.0)
Hemoglobin: 11.2 g/dL — ABNORMAL LOW (ref 12.0–15.0)
LYMPHS PCT: 27 % (ref 12–46)
Lymphs Abs: 1.5 10*3/uL (ref 0.7–4.0)
MCH: 24.5 pg — ABNORMAL LOW (ref 26.0–34.0)
MCHC: 31.7 g/dL (ref 30.0–36.0)
MCV: 77.2 fL — ABNORMAL LOW (ref 78.0–100.0)
MONO ABS: 0.5 10*3/uL (ref 0.1–1.0)
Monocytes Relative: 9 % (ref 3–12)
NEUTROS ABS: 3.4 10*3/uL (ref 1.7–7.7)
Neutrophils Relative %: 60 % (ref 43–77)
PLATELETS: 476 10*3/uL — AB (ref 150–400)
RBC: 4.57 MIL/uL (ref 3.87–5.11)
RDW: 14.2 % (ref 11.5–15.5)
WBC: 5.7 10*3/uL (ref 4.0–10.5)

## 2013-10-14 LAB — LIPID PANEL
CHOLESTEROL: 209 mg/dL — AB (ref 0–200)
HDL: 46 mg/dL (ref >39)
LDL Cholesterol: 145 mg/dL — ABNORMAL HIGH (ref 0–99)
Total CHOL/HDL Ratio: 4.5 Ratio
Triglycerides: 90 mg/dL (ref <150)
VLDL: 18 mg/dL (ref 0–40)

## 2013-10-14 LAB — TSH: TSH: 1.061 u[IU]/mL (ref 0.350–4.500)

## 2013-10-14 MED ORDER — GABAPENTIN 300 MG PO CAPS: 300.0000 mg | ORAL_CAPSULE | Freq: Every day | ORAL | Status: DC

## 2013-10-14 MED ORDER — NIFEDIPINE ER OSMOTIC RELEASE 60 MG PO TB24: ORAL_TABLET | ORAL | Status: DC

## 2013-10-14 MED ORDER — CYCLOBENZAPRINE HCL 10 MG PO TABS: 10.0000 mg | ORAL_TABLET | Freq: Every day | ORAL | Status: DC

## 2013-10-14 MED ORDER — LABETALOL HCL 300 MG PO TABS: 300.0000 mg | ORAL_TABLET | Freq: Two times a day (BID) | ORAL | Status: DC

## 2013-10-14 MED ORDER — MELOXICAM 15 MG PO TABS: 15.0000 mg | ORAL_TABLET | Freq: Every day | ORAL | Status: DC

## 2013-10-14 NOTE — Progress Notes (Signed)
Patient is here to establish care and back pain

## 2013-10-14 NOTE — Progress Notes (Signed)
Patient ID: Megan Baxter, female   DOB: 1971/01/01, 43 y.o.   MRN: [id number]   CC:  HPI: 43 year old female here to establish care. Patient has a history of hypertension which is uncontrolled her blood pressures greater than 180. The patient has been on labetalol and Procardia. Patient had just taken her medication before her appointment however on looking back at her readings from the ED the patient averages between 048 and 889 systolic. Denies any chest pain any shortness of breath. She is using a language interpreter, despite that the patient is a very poor historian. She points towards the scalp and says that she's had a pain underneath her scalp but not a headache for the last one year. She has been taking Naprosyn for this and ibuprofen. She also complains of back pain and points towards the left side of her thoracic spine. This pain has been present for the last 5 months.  She denies any nausea vomiting or blurry vision  Social history nonsmoker nonalcoholic  Family history reviewed and found to be negative      No Known Allergies Past Medical History  Diagnosis Date  . Hypertension   . Heartburn in pregnancy    No current outpatient prescriptions on file prior to visit.   No current facility-administered medications on file prior to visit.   Family History  Problem Relation Age of Onset  . Hypertension Father   . Alcohol abuse Neg Hx   . Arthritis Neg Hx   . Asthma Neg Hx   . Birth defects Neg Hx   . Cancer Neg Hx   . COPD Neg Hx   . Depression Neg Hx   . Diabetes Neg Hx   . Drug abuse Neg Hx   . Early death Neg Hx   . Hearing loss Neg Hx   . Heart disease Neg Hx   . Hyperlipidemia Neg Hx   . Kidney disease Neg Hx   . Learning disabilities Neg Hx   . Mental illness Neg Hx   . Mental retardation Neg Hx   . Miscarriages / Stillbirths Neg Hx   . Stroke Neg Hx   . Vision loss Neg Hx    History   Social History  . Marital Status: Married    Spouse Name: N/A     Number of Children: N/A  . Years of Education: N/A   Occupational History  . Not on file.   Social History Main Topics  . Smoking status: Never Smoker   . Smokeless tobacco: Not on file  . Alcohol Use: No  . Drug Use: No  . Sexual Activity: Not on file   Other Topics Concern  . Not on file   Social History Narrative  . No narrative on file    Review of Systems  Constitutional: Negative for fever, chills, diaphoresis, activity change, appetite change and fatigue.  HENT: Negative for ear pain, nosebleeds, congestion, facial swelling, rhinorrhea, neck pain, neck stiffness and ear discharge.   Eyes: Negative for pain, discharge, redness, itching and visual disturbance.  Respiratory: Negative for cough, choking, chest tightness, shortness of breath, wheezing and stridor.   Cardiovascular: Negative for chest pain, palpitations and leg swelling.  Gastrointestinal: Negative for abdominal distention.  Genitourinary: Negative for dysuria, urgency, frequency, hematuria, flank pain, decreased urine volume, difficulty urinating and dyspareunia.  Musculoskeletal: Negative for back pain, joint swelling, arthralgias and gait problem.  Neurological: As in history of present illness Hematological: Negative for adenopathy. Does not bruise/bleed easily.  Psychiatric/Behavioral: Negative for hallucinations, behavioral problems, confusion, dysphoric mood, decreased concentration and agitation.    Objective:   Filed Vitals:   10/14/13 0944  BP: 184/101  Pulse: 89  Temp: 98.7 F (37.1 C)    Physical Exam  Constitutional: Appears well-developed and well-nourished. No distress.  HENT: Normocephalic. External right and left ear normal. Oropharynx is clear and moist.  Eyes: Conjunctivae and EOM are normal. PERRLA, no scleral icterus.  Neck: Normal ROM. Neck supple. No JVD. No tracheal deviation. No thyromegaly.  CVS: RRR, S1/S2 +, no murmurs, no gallops, no carotid bruit.  Pulmonary: Effort  and breath sounds normal, no stridor, rhonchi, wheezes, rales.  Abdominal: Soft. BS +,  no distension, tenderness, rebound or guarding.  Musculoskeletal: Normal range of motion. No edema and no tenderness.  Lymphadenopathy: No lymphadenopathy noted, cervical, inguinal. Neuro: Alert. Normal reflexes, muscle tone coordination. No cranial nerve deficit. Skin: Skin is warm and dry. No rash noted. Not diaphoretic. No erythema. No pallor.  Psychiatric: Normal mood and affect. Behavior, judgment, thought content normal.   Lab Results  Component Value Date   WBC 9.7 04/27/2013   HGB 10.6* 04/27/2013   HCT 31.3* 04/27/2013   MCV 78.1 04/27/2013   PLT 324 04/27/2013   Lab Results  Component Value Date   CREATININE 0.49* 04/27/2013   BUN 5* 04/27/2013   NA 131* 04/27/2013   K 3.7 04/27/2013   CL 100 04/27/2013   CO2 21 04/27/2013    No results found for this basename: HGBA1C   Lipid Panel  No results found for this basename: chol, trig, hdl, cholhdl, vldl, ldlcalc       Assessment and plan:   Patient Active Problem List   Diagnosis Date Noted  . Cesarean delivery delivered 04/28/2013  . IUGR (intrauterine growth restriction) 04/28/2013  . HTN (hypertension) 10/26/2012   Hypertension Increase labetalol to 300 mg twice a day Continue Procardia Obtain renal panel and baseline labs   Headaches Unclear etiology, she denies scalp tenderness She describes pain under her skull CT of the head Patient has been prescribed Mobic for this    Back pain Obtained thoracic spine x-rays Most likely secondary to degenerative arthritis Rule out fracture Prescribed gabapentin and Flexeril at bedtime     Establish care Baseline labs Up-to-date with Pap smear during her pregnancy Schedule for baseline mammogram  All of the treatments The patient was given clear instructions to go to ER or return to medical center if symptoms don't improve, worsen or new problems develop. The patient verbalized  understanding. The patient was told to call to get any lab results if not heard anything in the next week.

## 2013-10-17 ENCOUNTER — Ambulatory Visit (HOSPITAL_COMMUNITY)
Admission: RE | Admit: 2013-10-17 | Discharge: 2013-10-17 | Disposition: A | Discharge status: Home / Self Care | Payer: BC Managed Care – PPO | Source: Ambulatory Visit | Attending: Internal Medicine | Admitting: Internal Medicine

## 2013-10-17 ENCOUNTER — Other Ambulatory Visit: Payer: Self-pay | Admitting: Internal Medicine

## 2013-10-17 ENCOUNTER — Encounter (HOSPITAL_COMMUNITY): Payer: Self-pay

## 2013-10-17 DIAGNOSIS — R51 Headache: Secondary | ICD-10-CM | POA: Insufficient documentation

## 2013-10-17 DIAGNOSIS — I10 Essential (primary) hypertension: Secondary | ICD-10-CM

## 2013-10-17 DIAGNOSIS — Z1231 Encounter for screening mammogram for malignant neoplasm of breast: Secondary | ICD-10-CM

## 2013-10-17 DIAGNOSIS — M546 Pain in thoracic spine: Secondary | ICD-10-CM | POA: Insufficient documentation

## 2013-10-18 MED ORDER — FERROUS SULFATE 325 (65 FE) MG PO TABS
325.0000 mg | ORAL_TABLET | Freq: Two times a day (BID) | ORAL | Status: DC
Start: 2013-10-18 — End: 2015-05-23

## 2013-10-18 NOTE — Addendum Note (Signed)
Addended by: Allyson Sabal MD, Ascencion Dike on: 10/18/2013 11:06 AM   Modules accepted: Orders

## 2013-10-20 ENCOUNTER — Telehealth: Payer: Self-pay | Admitting: Emergency Medicine

## 2013-10-20 NOTE — Telephone Encounter (Signed)
Message copied by Ricci Barker on Fri Oct 20, 2013  5:53 PM ------      Message from: Allyson Sabal MD, Ascencion Dike      Created: Wed Oct 18, 2013 10:04 AM       Notify patient of the CT of the head of the thoracic spine x-rays are negative, her headache is probably secondary to uncontrolled blood pressure ------

## 2013-10-20 NOTE — Telephone Encounter (Signed)
Left message for pt to return call.

## 2013-10-20 NOTE — Telephone Encounter (Signed)
Message copied by Ricci Barker on Fri Oct 20, 2013  5:35 PM ------      Message from: Allyson Sabal MD, Ascencion Dike      Created: Wed Oct 18, 2013  9:34 AM       Please notify patient of the thoracic spine x-ray is negative for fracture, alignment is normal ------

## 2013-10-23 NOTE — Telephone Encounter (Signed)
Pt husband called in for wife Ct results. Results given. Pt is still experiencing h/a's unrelieved with taking daily bp meds. Instructed husband to have wife check BP at drug store and if elevated, schedule appt

## 2013-11-09 ENCOUNTER — Ambulatory Visit
Admission: RE | Admit: 2013-11-09 | Discharge: 2013-11-09 | Disposition: A | Discharge status: Home / Self Care | Payer: BC Managed Care – PPO | Source: Ambulatory Visit | Attending: Internal Medicine | Admitting: Internal Medicine

## 2013-11-09 DIAGNOSIS — Z1231 Encounter for screening mammogram for malignant neoplasm of breast: Secondary | ICD-10-CM

## 2013-11-28 ENCOUNTER — Telehealth: Payer: Self-pay | Admitting: Internal Medicine

## 2013-11-28 ENCOUNTER — Telehealth: Payer: Self-pay | Admitting: Emergency Medicine

## 2013-11-28 NOTE — Telephone Encounter (Signed)
Left message for pt to call in regards to BP medication refill and pain issues. Amlodipine no longer on [facility]

## 2013-11-28 NOTE — Telephone Encounter (Signed)
Pt came into office stating that she is needing a refill of her medication Amlodipine 5MG , also pt states that she is having a lot of pain. Please contact pt as soon as possible

## 2013-12-12 ENCOUNTER — Ambulatory Visit: Payer: BC Managed Care – PPO | Attending: Internal Medicine | Admitting: Internal Medicine

## 2013-12-12 VITALS — BP 169/119 | HR 94 | Temp 97.9°F | Resp 18 | Ht 67.0 in | Wt 178.0 lb

## 2013-12-12 DIAGNOSIS — I10 Essential (primary) hypertension: Secondary | ICD-10-CM | POA: Insufficient documentation

## 2013-12-12 DIAGNOSIS — M549 Dorsalgia, unspecified: Secondary | ICD-10-CM

## 2013-12-12 DIAGNOSIS — R079 Chest pain, unspecified: Secondary | ICD-10-CM

## 2013-12-12 MED ORDER — MELOXICAM 15 MG PO TABS: 15.0000 mg | ORAL_TABLET | Freq: Every day | ORAL | Status: DC

## 2013-12-12 MED ORDER — CYCLOBENZAPRINE HCL 10 MG PO TABS: 10.0000 mg | ORAL_TABLET | Freq: Every day | ORAL | Status: DC

## 2013-12-12 NOTE — Progress Notes (Signed)
Pt comes in as walk in with c/o posterior right chest pain radiating to anterior area since yesterday. States pain has been constant,sharp unrelieved by prescribed Flexeril medication C/o tightness with movement in neck,denies dizziness and SOB Pt only speaks [place]. Per husband interpretor  BP 169/119 94

## 2013-12-14 ENCOUNTER — Ambulatory Visit (HOSPITAL_COMMUNITY)
Admission: RE | Admit: 2013-12-14 | Discharge: 2013-12-14 | Disposition: A | Discharge status: Home / Self Care | Payer: BC Managed Care – PPO | Source: Ambulatory Visit | Attending: Internal Medicine | Admitting: Internal Medicine

## 2013-12-14 DIAGNOSIS — R079 Chest pain, unspecified: Secondary | ICD-10-CM

## 2013-12-14 MED ORDER — IOHEXOL 300 MG/ML  SOLN
75.0000 mL | Freq: Once | INTRAMUSCULAR | Status: AC | PRN
  Administered 2013-12-14: 75 mL via INTRAVENOUS

## 2013-12-15 ENCOUNTER — Telehealth: Payer: Self-pay | Admitting: Emergency Medicine

## 2013-12-15 NOTE — Telephone Encounter (Signed)
Left message for pt to return call when message received  

## 2013-12-15 NOTE — Telephone Encounter (Signed)
Message copied by Ricci Barker on Fri Dec 15, 2013  4:04 PM ------      Message from: Tresa Garter      Created: Fri Dec 15, 2013  4:01 PM       Please inform patient that her chest CT scan is normal ------

## 2013-12-19 ENCOUNTER — Encounter: Payer: Self-pay | Admitting: *Deleted

## 2013-12-19 ENCOUNTER — Telehealth: Payer: Self-pay | Admitting: Internal Medicine

## 2013-12-19 NOTE — Telephone Encounter (Signed)
Pt's husband came in today to request his wife's lab results; pt was NOT listed in [facility] form and it was explained to her husband the process; husband was upset because he has been contacting the office for the results without a return; pt's wife speaks no english so the [employer] form was given to the husband to fill out an bring back so that he can receive the information directly; pt would like to be called in the morning whenever results are given because he works all day; please f/u with pt and husband to explain the process on lab results, thank you

## 2013-12-19 NOTE — Progress Notes (Signed)
Pt's husband called and I informed him of her CT scan.

## 2013-12-19 NOTE — Progress Notes (Signed)
   Subjective:    Patient ID: Megan Baxter, female    DOB: 1971-05-14, 43 y.o.   MRN: [id number]  HPI    Review of Systems     Objective:   Physical Exam        Assessment & Plan:

## 2013-12-20 ENCOUNTER — Telehealth: Payer: Self-pay | Admitting: Emergency Medicine

## 2013-12-20 NOTE — Telephone Encounter (Signed)
Left message for husband to call my direct line [phone] for wife lab results. Please transfer call if he calls again. Thanks

## 2013-12-20 NOTE — Telephone Encounter (Signed)
Pt  Husband returned phone message. Husband states he was given CT scan results yesterday, but is concerned with wife ongoing sx of chest pain. States all the test have came back negative but wife is still having intermit chest pain. Informed pt wife needs to be seen by provider for further evaluation. Will speak with Dr. Doreene Burke- Candie Chroman RN

## 2014-01-08 ENCOUNTER — Other Ambulatory Visit: Payer: Self-pay | Admitting: Internal Medicine

## 2014-01-15 ENCOUNTER — Ambulatory Visit: Payer: Medicaid Other | Admitting: Internal Medicine

## 2014-02-01 ENCOUNTER — Ambulatory Visit: Payer: BC Managed Care – PPO | Admitting: Internal Medicine

## 2014-02-18 NOTE — Progress Notes (Signed)
Patient ID: Megan Baxter, female   DOB: 04-Sep-1970, 43 y.o.   MRN: [id number]   Megan Baxter, is a 43 y.o. female  [id number]  [id number]  DOB - 1971/02/07  Chief Complaint  Patient presents with  . Follow-up  . Chest Pain  . Hypertension        Subjective:   Megan Baxter is a 43 y.o. female here today for a follow up visit. Pt comes in as walk-in with c/o right chest pain mostly back area radiating to the front area since yesterday. States pain has been constant, sharp unrelieved by prescribed Flexeril medication. She complained of tightness with movement in neck, denies dizziness and SOB. Pt only speaks [place]. Her husband is interpreter  Patient has No headache, No abdominal pain - No Nausea, No new weakness tingling or numbness, No Cough - SOB.  No problems updated.  ALLERGIES: No Known Allergies  PAST MEDICAL HISTORY: Past Medical History  Diagnosis Date  . Hypertension   . Heartburn in pregnancy     MEDICATIONS AT HOME: Prior to Admission medications   Medication Sig Start Date End Date Taking? Authorizing Provider  ferrous sulfate 325 (65 FE) MG tablet Take 1 tablet (325 mg total) by mouth 2 (two) times daily with a meal. 10/18/13  Yes Reyne Dumas, MD  gabapentin (NEURONTIN) 300 MG capsule Take 1 capsule (300 mg total) by mouth at bedtime. 10/14/13  Yes Reyne Dumas, MD  labetalol (NORMODYNE) 300 MG tablet Take 1 tablet (300 mg total) by mouth 2 (two) times daily. 10/14/13  Yes Reyne Dumas, MD  cyclobenzaprine (FLEXERIL) 10 MG tablet TAKE 1 TABLET BY MOUTH AT BEDTIME.    Angelica Chessman, MD  meloxicam (MOBIC) 15 MG tablet TAKE 1 TABLET BY MOUTH DAILY.    Angelica Chessman, MD  NIFEdipine (PROCARDIA XL) 60 MG 24 hr tablet Take 2 tabs daily for BP 10/14/13   Reyne Dumas, MD     Objective:   Filed Vitals:   12/12/13 1042  BP: 169/119  Pulse: 94  Temp: 97.9 F (36.6 C)  TempSrc: Oral  Resp: 18  Height: 5\' 7"  (1.702 m)  Weight: 178 lb (80.74 kg)    SpO2: 100%    Exam General appearance : Awake, alert, not in any distress. Speech Clear. Not toxic looking HEENT: Atraumatic and Normocephalic, pupils equally reactive to light and accomodation Neck: supple, no JVD. No cervical lymphadenopathy.  Chest:Good air entry bilaterally, no added sounds  CVS: S1 S2 regular, no murmurs.  Abdomen: Bowel sounds present, Non tender and not distended with no gaurding, rigidity or rebound. Extremities: B/L Lower Ext shows no edema, both legs are warm to touch Neurology: Awake alert, and oriented X 3, CN II-XII intact, Non focal Skin:No Rash Wounds:N/A  Data Review No results found for this basename: HGBA1C     Assessment & Plan   1. Chest pain  - CT Chest W Contrast; immediately  We'll review the results and subsequent management will depend on findings Patient was counseled extensively about blood pressure control, she was given specific instructions on the complications of hypertension and the need to be compliant with medications. Patient verbalized understanding. She claims her pain is the result of her blood pressure being high today because her blood pressure is usually in good control with current regimen.  Interpreter was used to communicate directly with patient for the entire encounter including providing detailed patient instructions.   The patient was given clear instructions to go to ER or return to  medical center if symptoms don't improve, worsen or new problems develop. The patient verbalized understanding. The patient was told to call to get lab results if they haven't heard anything in the next week.   This note has been created with Surveyor, quantity. Any transcriptional errors are unintentional.    Angelica Chessman, MD, [place], [place], [place] and [place], [place]   02/18/2014, 9:24 PM

## 2014-02-19 ENCOUNTER — Encounter: Payer: Self-pay | Admitting: Internal Medicine

## 2014-04-24 ENCOUNTER — Other Ambulatory Visit: Payer: Self-pay | Admitting: Internal Medicine

## 2014-04-25 ENCOUNTER — Other Ambulatory Visit: Payer: Self-pay

## 2014-05-03 ENCOUNTER — Encounter: Payer: Self-pay | Admitting: Internal Medicine

## 2014-05-03 ENCOUNTER — Ambulatory Visit: Payer: BC Managed Care – PPO | Attending: Internal Medicine | Admitting: Internal Medicine

## 2014-05-03 VITALS — BP 168/116 | HR 78 | Temp 98.4°F | Resp 16 | Ht 63.5 in | Wt 172.0 lb

## 2014-05-03 DIAGNOSIS — I10 Essential (primary) hypertension: Secondary | ICD-10-CM | POA: Diagnosis not present

## 2014-05-03 DIAGNOSIS — G8929 Other chronic pain: Secondary | ICD-10-CM | POA: Insufficient documentation

## 2014-05-03 DIAGNOSIS — M549 Dorsalgia, unspecified: Secondary | ICD-10-CM | POA: Diagnosis not present

## 2014-05-03 DIAGNOSIS — Z791 Long term (current) use of non-steroidal anti-inflammatories (NSAID): Secondary | ICD-10-CM | POA: Insufficient documentation

## 2014-05-03 LAB — LIPID PANEL
CHOL/HDL RATIO: 4.2 ratio
Cholesterol: 196 mg/dL (ref 0–200)
HDL: 47 mg/dL (ref >39)
LDL CALC: 133 mg/dL — AB (ref 0–99)
TRIGLYCERIDES: 79 mg/dL (ref <150)
VLDL: 16 mg/dL (ref 0–40)

## 2014-05-03 LAB — COMPLETE METABOLIC PANEL WITH GFR
ALK PHOS: 80 U/L (ref 39–117)
ALT: 11 U/L (ref 0–35)
AST: 10 U/L (ref 0–37)
Albumin: 4.1 g/dL (ref 3.5–5.2)
BUN: 15 mg/dL (ref 6–23)
CO2: 26 mEq/L (ref 19–32)
CREATININE: 0.79 mg/dL (ref 0.50–1.10)
Calcium: 10.2 mg/dL (ref 8.4–10.5)
Chloride: 104 mEq/L (ref 96–112)
GFR, Est African American: >89 mL/min
GFR, Est Non African American: >89 mL/min
Glucose, Bld: 99 mg/dL (ref 70–99)
POTASSIUM: 4.5 meq/L (ref 3.5–5.3)
Sodium: 138 mEq/L (ref 135–145)
Total Bilirubin: 0.5 mg/dL (ref 0.2–1.2)
Total Protein: 7.2 g/dL (ref 6.0–8.3)

## 2014-05-03 LAB — CBC WITH DIFFERENTIAL/PLATELET
BASOS ABS: 0 10*3/uL (ref 0.0–0.1)
BASOS PCT: 0 % (ref 0–1)
Eosinophils Absolute: 0.2 10*3/uL (ref 0.0–0.7)
Eosinophils Relative: 3 % (ref 0–5)
HEMATOCRIT: 36.1 % (ref 36.0–46.0)
Hemoglobin: 11.6 g/dL — ABNORMAL LOW (ref 12.0–15.0)
Lymphocytes Relative: 33 % (ref 12–46)
Lymphs Abs: 2.1 10*3/uL (ref 0.7–4.0)
MCH: 24.7 pg — ABNORMAL LOW (ref 26.0–34.0)
MCHC: 32.1 g/dL (ref 30.0–36.0)
MCV: 77 fL — ABNORMAL LOW (ref 78.0–100.0)
MONO ABS: 0.6 10*3/uL (ref 0.1–1.0)
Monocytes Relative: 10 % (ref 3–12)
NEUTROS ABS: 3.4 10*3/uL (ref 1.7–7.7)
Neutrophils Relative %: 54 % (ref 43–77)
PLATELETS: 493 10*3/uL — AB (ref 150–400)
RBC: 4.69 MIL/uL (ref 3.87–5.11)
RDW: 15.2 % (ref 11.5–15.5)
WBC: 6.3 10*3/uL (ref 4.0–10.5)

## 2014-05-03 MED ORDER — NIFEDIPINE ER OSMOTIC RELEASE 60 MG PO TB24: ORAL_TABLET | ORAL | Status: DC

## 2014-05-03 MED ORDER — CLONIDINE HCL 0.1 MG PO TABS
0.2000 mg | ORAL_TABLET | Freq: Once | ORAL | Status: AC
Start: 2014-05-03 — End: 2014-05-03
  Administered 2014-05-03: 0.2 mg via ORAL

## 2014-05-03 MED ORDER — LABETALOL HCL 300 MG PO TABS: 300.0000 mg | ORAL_TABLET | Freq: Two times a day (BID) | ORAL | Status: DC

## 2014-05-03 MED ORDER — GABAPENTIN 300 MG PO CAPS: 300.0000 mg | ORAL_CAPSULE | Freq: Every day | ORAL | Status: DC

## 2014-05-03 NOTE — Progress Notes (Signed)
Pt is here following up on her chronic upper back pain and HTN. Pt reports not having BP medications for 3 days.

## 2014-05-03 NOTE — Patient Instructions (Signed)
DASH Eating Plan DASH stands for "Dietary Approaches to Stop Hypertension." The DASH eating plan is a healthy eating plan that has been shown to reduce high blood pressure (hypertension). Additional health benefits may include reducing the risk of type 2 diabetes mellitus, heart disease, and stroke. The DASH eating plan may also help with weight loss. WHAT DO I NEED TO KNOW ABOUT THE DASH EATING PLAN? For the DASH eating plan, you will follow these general guidelines:  Choose foods with a percent daily value for sodium of less than 5% (as listed on the food label).  Use salt-free seasonings or herbs instead of table salt or sea salt.  Check with your health care provider or pharmacist before using salt substitutes.  Eat lower-sodium products, often labeled as "lower sodium" or "no salt added."  Eat fresh foods.  Eat more vegetables, fruits, and low-fat dairy products.  Choose whole grains. Look for the word "whole" as the first word in the ingredient list.  Choose fish and skinless chicken or turkey more often than red meat. Limit fish, poultry, and meat to 6 oz (170 g) each day.  Limit sweets, desserts, sugars, and sugary drinks.  Choose heart-healthy fats.  Limit cheese to 1 oz (28 g) per day.  Eat more home-cooked food and less restaurant, buffet, and fast food.  Limit fried foods.  Cook foods using methods other than frying.  Limit canned vegetables. If you do use them, rinse them well to decrease the sodium.  When eating at a restaurant, ask that your food be prepared with less salt, or no salt if possible. WHAT FOODS CAN I EAT? Seek help from a dietitian for individual calorie needs. Grains Whole grain or whole wheat bread. Brown rice. Whole grain or whole wheat pasta. Quinoa, bulgur, and whole grain cereals. Low-sodium cereals. Corn or whole wheat flour tortillas. Whole grain cornbread. Whole grain crackers. Low-sodium crackers. Vegetables Fresh or frozen vegetables  (raw, steamed, roasted, or grilled). Low-sodium or reduced-sodium tomato and vegetable juices. Low-sodium or reduced-sodium tomato sauce and paste. Low-sodium or reduced-sodium canned vegetables.  Fruits All fresh, canned (in natural juice), or frozen fruits. Meat and Other Protein Products Ground beef (85% or leaner), grass-fed beef, or beef trimmed of fat. Skinless chicken or turkey. Ground chicken or turkey. Pork trimmed of fat. All fish and seafood. Eggs. Dried beans, peas, or lentils. Unsalted nuts and seeds. Unsalted canned beans. Dairy Low-fat dairy products, such as skim or 1% milk, 2% or reduced-fat cheeses, low-fat ricotta or cottage cheese, or plain low-fat yogurt. Low-sodium or reduced-sodium cheeses. Fats and Oils Tub margarines without trans fats. Light or reduced-fat mayonnaise and salad dressings (reduced sodium). Avocado. Safflower, olive, or canola oils. Natural peanut or almond butter. Other Unsalted popcorn and pretzels. The items listed above may not be a complete list of recommended foods or beverages. Contact your dietitian for more options. WHAT FOODS ARE NOT RECOMMENDED? Grains White bread. White pasta. White rice. Refined cornbread. Bagels and croissants. Crackers that contain trans fat. Vegetables Creamed or fried vegetables. Vegetables in a cheese sauce. Regular canned vegetables. Regular canned tomato sauce and paste. Regular tomato and vegetable juices. Fruits Dried fruits. Canned fruit in light or heavy syrup. Fruit juice. Meat and Other Protein Products Fatty cuts of meat. Ribs, chicken wings, bacon, sausage, bologna, salami, chitterlings, fatback, hot dogs, bratwurst, and packaged luncheon meats. Salted nuts and seeds. Canned beans with salt. Dairy Whole or 2% milk, cream, half-and-half, and cream cheese. Whole-fat or sweetened yogurt. Full-fat   cheeses or blue cheese. Nondairy creamers and whipped toppings. Processed cheese, cheese spreads, or cheese  curds. Condiments Onion and garlic salt, seasoned salt, table salt, and sea salt. Canned and packaged gravies. Worcestershire sauce. Tartar sauce. Barbecue sauce. Teriyaki sauce. Soy sauce, including reduced sodium. Steak sauce. Fish sauce. Oyster sauce. Cocktail sauce. Horseradish. Ketchup and mustard. Meat flavorings and tenderizers. Bouillon cubes. Hot sauce. Tabasco sauce. Marinades. Taco seasonings. Relishes. Fats and Oils Butter, stick margarine, lard, shortening, ghee, and bacon fat. Coconut, palm kernel, or palm oils. Regular salad dressings. Other Pickles and olives. Salted popcorn and pretzels. The items listed above may not be a complete list of foods and beverages to avoid. Contact your dietitian for more information. WHERE CAN I FIND MORE INFORMATION? National Heart, Lung, and Blood Institute: www.nhlbi.nih.gov/health/health-topics/topics/dash/ Document Released: 07/02/2011 Document Revised: 11/27/2013 Document Reviewed: 05/17/2013 ExitCare Patient Information 2015 Exit[employer]. This information is not intended to replace advice given to you by your health care provider. Make sure you discuss any questions you have with your health care provider. Hypertension Hypertension, commonly called high blood pressure, is when the force of blood pumping through your arteries is too strong. Your arteries are the blood vessels that carry blood from your heart throughout your body. A blood pressure reading consists of a higher number over a lower number, such as 110/72. The higher number (systolic) is the pressure inside your arteries when your heart pumps. The lower number (diastolic) is the pressure inside your arteries when your heart relaxes. Ideally you want your blood pressure below 120/80. Hypertension forces your heart to work harder to pump blood. Your arteries may become narrow or stiff. Having hypertension puts you at risk for heart disease, stroke, and other problems.  RISK  FACTORS Some risk factors for high blood pressure are controllable. Others are not.  Risk factors you cannot control include:   Race. You may be at higher risk if you are African American.  Age. Risk increases with age.  Gender. Men are at higher risk than women before age 45 years. After age 65, women are at higher risk than men. Risk factors you can control include:  Not getting enough exercise or physical activity.  Being overweight.  Getting too much fat, sugar, calories, or salt in your diet.  Drinking too much alcohol. SIGNS AND SYMPTOMS Hypertension does not usually cause signs or symptoms. Extremely high blood pressure (hypertensive crisis) may cause headache, anxiety, shortness of breath, and nosebleed. DIAGNOSIS  To check if you have hypertension, your health care provider will measure your blood pressure while you are seated, with your arm held at the level of your heart. It should be measured at least twice using the same arm. Certain conditions can cause a difference in blood pressure between your right and left arms. A blood pressure reading that is higher than normal on one occasion does not mean that you need treatment. If one blood pressure reading is high, ask your health care provider about having it checked again. TREATMENT  Treating high blood pressure includes making lifestyle changes and possibly taking medicine. Living a healthy lifestyle can help lower high blood pressure. You may need to change some of your habits. Lifestyle changes may include:  Following the DASH diet. This diet is high in fruits, vegetables, and whole grains. It is low in salt, red meat, and added sugars.  Getting at least 2 hours of brisk physical activity every week.  Losing weight if necessary.  Not smoking.  Limiting   alcoholic beverages.  Learning ways to reduce stress. If lifestyle changes are not enough to get your blood pressure under control, your health care provider may  prescribe medicine. You may need to take more than one. Work closely with your health care provider to understand the risks and benefits. HOME CARE INSTRUCTIONS  Have your blood pressure rechecked as directed by your health care provider.   Take medicines only as directed by your health care provider. Follow the directions carefully. Blood pressure medicines must be taken as prescribed. The medicine does not work as well when you skip doses. Skipping doses also puts you at risk for problems.   Do not smoke.   Monitor your blood pressure at home as directed by your health care provider. SEEK MEDICAL CARE IF:   You think you are having a reaction to medicines taken.  You have recurrent headaches or feel dizzy.  You have swelling in your ankles.  You have trouble with your vision. SEEK IMMEDIATE MEDICAL CARE IF:  You develop a severe headache or confusion.  You have unusual weakness, numbness, or feel faint.  You have severe chest or abdominal pain.  You vomit repeatedly.  You have trouble breathing. MAKE SURE YOU:   Understand these instructions.  Will watch your condition.  Will get help right away if you are not doing well or get worse. Document Released: 07/13/2005 Document Revised: 11/27/2013 Document Reviewed: 05/05/2013 ExitCare Patient Information 2015 Exit[employer], [employer]. This information is not intended to replace advice given to you by your health care provider. Make sure you discuss any questions you have with your health care provider.  

## 2014-05-03 NOTE — Progress Notes (Signed)
Patient ID: Megan Baxter, female   DOB: 1971/04/03, 43 y.o.   MRN: [id number]   Megan Baxter, is a 43 y.o. female  [id number]  [id number]  DOB - 12-03-1970  Chief Complaint  Patient presents with  . Follow-up        Subjective:   Megan Baxter is a 43 y.o. female here today for a follow up visit. Patient has history of hypertension and chronic back pain came to the clinic for routine follow-up. Patient has run out of her blood pressure medication for more than 3 days. Patient is requesting refill of medications. She has no significant complaints today except for ongoing upper back pain. No history of fall. No history of fever. No dizziness. No blurry vision. Patient has No headache, No chest pain, No abdominal pain - No Nausea, No new weakness tingling or numbness, No Cough - SOB.  No problems updated.  ALLERGIES: No Known Allergies  PAST MEDICAL HISTORY: Past Medical History  Diagnosis Date  . Hypertension   . Heartburn in pregnancy     MEDICATIONS AT HOME: Prior to Admission medications   Medication Sig Start Date End Date Taking? Authorizing Provider  labetalol (NORMODYNE) 300 MG tablet Take 1 tablet (300 mg total) by mouth 2 (two) times daily. 05/03/14  Yes Tresa Garter, MD  cyclobenzaprine (FLEXERIL) 10 MG tablet TAKE 1 TABLET BY MOUTH AT BEDTIME.    Tresa Garter, MD  ferrous sulfate 325 (65 FE) MG tablet Take 1 tablet (325 mg total) by mouth 2 (two) times daily with a meal. 10/18/13   Reyne Dumas, MD  gabapentin (NEURONTIN) 300 MG capsule Take 1 capsule (300 mg total) by mouth at bedtime. 05/03/14   Tresa Garter, MD  meloxicam (MOBIC) 15 MG tablet TAKE 1 TABLET BY MOUTH DAILY.    Tresa Garter, MD  NIFEdipine (PROCARDIA XL) 60 MG 24 hr tablet Take 2 tabs daily for BP 05/03/14   Tresa Garter, MD     Objective:   Filed Vitals:   05/03/14 0920 05/03/14 0942  BP: 209/141 218/118  Pulse: 78   Temp: 98.4 F (36.9 C)   TempSrc:  Oral   Resp: 16   Height: 5' 3.5" (1.613 m)   Weight: 171 lb 15.3 oz (78 kg)   SpO2: 100%     Exam General appearance : Awake, alert, not in any distress. Speech Clear. Not toxic looking HEENT: Atraumatic and Normocephalic, pupils equally reactive to light and accomodation Neck: supple, no JVD. No cervical lymphadenopathy.  Chest:Good air entry bilaterally, no added sounds  CVS: S1 S2 regular, no murmurs.  Abdomen: Bowel sounds present, Non tender and not distended with no gaurding, rigidity or rebound. Extremities: B/L Lower Ext shows no edema, both legs are warm to touch Neurology: Awake alert, and oriented X 3, CN II-XII intact, Non focal   Data Review No results found for this basename: HGBA1C     Assessment & Plan   1. Essential hypertension  - CBC with Differential - COMPLETE METABOLIC PANEL WITH GFR - POCT glycosylated hemoglobin (Hb A1C) - Lipid panel - Urinalysis, Complete  - labetalol (NORMODYNE) 300 MG tablet; Take 1 tablet (300 mg total) by mouth 2 (two) times daily.  Dispense: 180 tablet; Refill: 3 - gabapentin (NEURONTIN) 300 MG capsule; Take 1 capsule (300 mg total) by mouth at bedtime.  Dispense: 180 capsule; Refill: 3 - NIFEdipine (PROCARDIA XL) 60 MG 24 hr tablet; Take 2 tabs daily for BP  Dispense: 180  tablet; Refill: 3 - cloNIDine (CATAPRES) tablet 0.2 mg; Take 2 tablets (0.2 mg total) by mouth once.  - We have discussed target BP range and blood pressure goal - I have advised patient to check BP regularly and to call us back or report to clinic if the numbers are consistently higher than 140/90  - We discussed the importance of compliance with medical therapy and DASH diet recommended, consequences of uncontrolled hypertension discussed.  - continue current BP medications  Interpreter was used to communicate directly with patient for the entire encounter including providing detailed patient instructions.   Return in about 2 weeks (around 05/17/2014),  or if symptoms worsen or fail to improve, for BP Check, Nurse Visit.  The patient was given clear instructions to go to ER or return to medical center if symptoms don't improve, worsen or new problems develop. The patient verbalized understanding. The patient was told to call to get lab results if they haven't heard anything in the next week.   This note has been created with Surveyor, quantity. Any transcriptional errors are unintentional.    Angelica Chessman, MD, [place], [place], [place] and [place], [place]   05/03/2014, 9:51 AM

## 2014-05-04 LAB — URINALYSIS, COMPLETE
BACTERIA UA: NONE SEEN
Bilirubin Urine: NEGATIVE
CRYSTALS: NONE SEEN
Casts: NONE SEEN
Glucose, UA: NEGATIVE mg/dL
Hgb urine dipstick: NEGATIVE
KETONES UR: NEGATIVE mg/dL
Leukocytes, UA: NEGATIVE
Nitrite: NEGATIVE
PROTEIN: NEGATIVE mg/dL
Specific Gravity, Urine: 1.006 (ref 1.005–1.030)
Squamous Epithelial / LPF: NONE SEEN
UROBILINOGEN UA: 0.2 mg/dL (ref 0.0–1.0)
pH: 6.5 (ref 5.0–8.0)

## 2014-05-07 ENCOUNTER — Telehealth: Payer: Self-pay | Admitting: Internal Medicine

## 2014-05-07 NOTE — Telephone Encounter (Signed)
Pt's husband called on behalf of pt. Stating that she has had a headache since she came in for an office visit. Pt. Would like to know what to do next. Please f/u with pt.

## 2014-05-17 ENCOUNTER — Ambulatory Visit: Payer: BC Managed Care – PPO | Attending: Internal Medicine

## 2014-05-17 NOTE — Progress Notes (Unsigned)
Patient ID: Megan Baxter, female   DOB: 1971-01-10, 43 y.o.   MRN: [id number] Pt here for blood pressure recheck after last visit hypertensive crisis Pt medications adjusted with Labetalol 300 mg tab Pt is compliant with taking medications daily BP- 111/74 76 Instructed to continue medication regimen and follow DASH diet

## 2014-05-29 ENCOUNTER — Ambulatory Visit: Payer: BC Managed Care – PPO | Attending: Internal Medicine | Admitting: Internal Medicine

## 2014-05-29 ENCOUNTER — Encounter: Payer: Self-pay | Admitting: Internal Medicine

## 2014-05-29 VITALS — BP 138/94 | HR 94 | Temp 98.3°F | Resp 16 | Ht 65.0 in | Wt 171.0 lb

## 2014-05-29 DIAGNOSIS — Z791 Long term (current) use of non-steroidal anti-inflammatories (NSAID): Secondary | ICD-10-CM | POA: Insufficient documentation

## 2014-05-29 DIAGNOSIS — M5441 Lumbago with sciatica, right side: Secondary | ICD-10-CM | POA: Diagnosis not present

## 2014-05-29 DIAGNOSIS — I10 Essential (primary) hypertension: Secondary | ICD-10-CM | POA: Insufficient documentation

## 2014-05-29 MED ORDER — ACETAMINOPHEN-CODEINE #3 300-30 MG PO TABS: 1.0000 | ORAL_TABLET | ORAL | Status: DC | PRN

## 2014-05-29 MED ORDER — DICLOFENAC SODIUM 1 % TD GEL: 2.0000 g | Freq: Four times a day (QID) | TRANSDERMAL | Status: DC

## 2014-05-29 MED ORDER — CYCLOBENZAPRINE HCL 10 MG PO TABS: 10.0000 mg | ORAL_TABLET | Freq: Every day | ORAL | Status: DC

## 2014-05-29 NOTE — Progress Notes (Signed)
Pt is here following up on her HTN and chronic lower back pain. Pt states that she has no pain right now b/c she took her tylenol. Pt reports having a sharp headache off and on for 2 years.

## 2014-05-29 NOTE — Progress Notes (Signed)
Patient ID: Megan Baxter, female   DOB: 06/30/1971, 43 y.o.   MRN: [id number]   Megan Baxter, is a 43 y.o. female  [id number]  [id number]  DOB - [birth year]  Chief Complaint  Patient presents with  . Follow-up        Subjective:   Megan Baxter is a 43 y.o. female here today for a follow up visit. Patient with history of hypertension and chronic midline low back pain here today with same complaint. Patient states that she has no pain right now because she just took a Tylenol but she is having all this on and off pain for the past 2 years. She is requesting something stronger than plain Tylenol and last longer than every 4 hours. She has no injury or history of fall. No urinary incontinence or fecal incontinence.Patient has No chest pain, No abdominal pain - No Nausea, No new weakness tingling or numbness, No Cough - SOB.  Problem  Midline Low Back Pain With Right-Sided Sciatica    ALLERGIES: No Known Allergies  PAST MEDICAL HISTORY: Past Medical History  Diagnosis Date  . Hypertension   . Heartburn in pregnancy     MEDICATIONS AT HOME: Prior to Admission medications   Medication Sig Start Date End Date Taking? Authorizing Provider  cyclobenzaprine (FLEXERIL) 10 MG tablet Take 1 tablet (10 mg total) by mouth at bedtime. 05/29/14  Yes Tresa Garter, MD  ferrous sulfate 325 (65 FE) MG tablet Take 1 tablet (325 mg total) by mouth 2 (two) times daily with a meal. 10/18/13  Yes Reyne Dumas, MD  gabapentin (NEURONTIN) 300 MG capsule Take 1 capsule (300 mg total) by mouth at bedtime. 05/03/14  Yes Tresa Garter, MD  labetalol (NORMODYNE) 300 MG tablet Take 1 tablet (300 mg total) by mouth 2 (two) times daily. 05/03/14  Yes Tresa Garter, MD  meloxicam (MOBIC) 15 MG tablet TAKE 1 TABLET BY MOUTH DAILY.   Yes Tresa Garter, MD  NIFEdipine (PROCARDIA XL) 60 MG 24 hr tablet Take 2 tabs daily for BP 05/03/14  Yes Charlot Gouin E Doreene Burke, MD  acetaminophen-codeine  (TYLENOL #3) 300-30 MG per tablet Take 1 tablet by mouth every 4 (four) hours as needed. 05/29/14   Tresa Garter, MD  diclofenac sodium (VOLTAREN) 1 % GEL Apply 2 g topically 4 (four) times daily. 05/29/14   Tresa Garter, MD     Objective:   Filed Vitals:   05/29/14 1052  BP: 138/94  Pulse: 94  Temp: 98.3 F (36.8 C)  TempSrc: Oral  Resp: 16  Height: 5\' 5"  (1.651 m)  Weight: 171 lb (77.565 kg)  SpO2: 100%    Exam General appearance : Awake, alert, not in any distress. Speech Clear. Not toxic looking HEENT: Atraumatic and Normocephalic, pupils equally reactive to light and accomodation Neck: supple, no JVD. No cervical lymphadenopathy.  Chest:Good air entry bilaterally, no added sounds  CVS: S1 S2 regular, no murmurs.  Abdomen: Bowel sounds present, Non tender and not distended with no gaurding, rigidity or rebound. Extremities: B/L Lower Ext shows no edema, both legs are warm to touch Neurology: Awake alert, and oriented X 3, CN II-XII intact, Non focal Skin:No Rash Wounds:N/A  Data Review No results found for: HGBA1C   Assessment & Plan   1. Midline low back pain with right-sided sciatica  - acetaminophen-codeine (TYLENOL #3) 300-30 MG per tablet; Take 1 tablet by mouth every 4 (four) hours as needed.  Dispense: 60 tablet; Refill: 0 -  cyclobenzaprine (FLEXERIL) 10 MG tablet; Take 1 tablet (10 mg total) by mouth at bedtime.  Dispense: 30 tablet; Refill: 0 - diclofenac sodium (VOLTAREN) 1 % GEL; Apply 2 g topically 4 (four) times daily.  Dispense: 2 Tube; Refill: 1  Interpreter was used to communicate directly with patient for the entire encounter including providing detailed patient instructions.   Return in about 3 months (around 08/29/2014), or if symptoms worsen or fail to improve, for Follow up HTN.  The patient was given clear instructions to go to ER or return to medical center if symptoms don't improve, worsen or new problems develop. The patient  verbalized understanding. The patient was told to call to get lab results if they haven't heard anything in the next week.   This note has been created with Surveyor, quantity. Any transcriptional errors are unintentional.    Angelica Chessman, MD, [place], [place], [place] and [place], [place]   05/29/2014, 11:24 AM

## 2014-05-29 NOTE — Patient Instructions (Signed)
Back Pain, Adult Low back pain is very common. About 1 in 5 people have back pain.The cause of low back pain is rarely dangerous. The pain often gets better over time.About half of people with a sudden onset of back pain feel better in just 2 weeks. About 8 in 10 people feel better by 6 weeks.  CAUSES Some common causes of back pain include:  Strain of the muscles or ligaments supporting the spine.  Wear and tear (degeneration) of the spinal discs.  Arthritis.  Direct injury to the back. DIAGNOSIS Most of the time, the direct cause of low back pain is not known.However, back pain can be treated effectively even when the exact cause of the pain is unknown.Answering your caregiver's questions about your overall health and symptoms is one of the most accurate ways to make sure the cause of your pain is not dangerous. If your caregiver needs more information, he or she may order lab work or imaging tests (X-rays or MRIs).However, even if imaging tests show changes in your back, this usually does not require surgery. HOME CARE INSTRUCTIONS For many people, back pain returns.Since low back pain is rarely dangerous, it is often a condition that people can learn to manageon their own.   Remain active. It is stressful on the back to sit or stand in one place. Do not sit, drive, or stand in one place for more than 30 minutes at a time. Take short walks on level surfaces as soon as pain allows.Try to increase the length of time you walk each day.  Do not stay in bed.Resting more than 1 or 2 days can delay your recovery.  Do not avoid exercise or work.Your body is made to move.It is not dangerous to be active, even though your back may hurt.Your back will likely heal faster if you return to being active before your pain is gone.  Pay attention to your body when you bend and lift. Many people have less discomfortwhen lifting if they bend their knees, keep the load close to their bodies,and  avoid twisting. Often, the most comfortable positions are those that put less stress on your recovering back.  Find a comfortable position to sleep. Use a firm mattress and lie on your side with your knees slightly bent. If you lie on your back, put a pillow under your knees.  Only take over-the-counter or prescription medicines as directed by your caregiver. Over-the-counter medicines to reduce pain and inflammation are often the most helpful.Your caregiver may prescribe muscle relaxant drugs.These medicines help dull your pain so you can more quickly return to your normal activities and healthy exercise.  Put ice on the injured area.  Put ice in a plastic bag.  Place a towel between your skin and the bag.  Leave the ice on for 15-20 minutes, 03-04 times a day for the first 2 to 3 days. After that, ice and heat may be alternated to reduce pain and spasms.  Ask your caregiver about trying back exercises and gentle massage. This may be of some benefit.  Avoid feeling anxious or stressed.Stress increases muscle tension and can worsen back pain.It is important to recognize when you are anxious or stressed and learn ways to manage it.Exercise is a great option. SEEK MEDICAL CARE IF:  You have pain that is not relieved with rest or medicine.  You have pain that does not improve in 1 week.  You have new symptoms.  You are generally not feeling well. SEEK   IMMEDIATE MEDICAL CARE IF:   You have pain that radiates from your back into your legs.  You develop new bowel or bladder control problems.  You have unusual weakness or numbness in your arms or legs.  You develop nausea or vomiting.  You develop abdominal pain.  You feel faint. Document Released: 07/13/2005 Document Revised: 01/12/2012 Document Reviewed: 11/14/2013 ExitCare Patient Information 2015 [employer]. This information is not intended to replace advice given to you by your health care provider. Make sure you  discuss any questions you have with your health care provider.  

## 2014-08-13 ENCOUNTER — Other Ambulatory Visit: Payer: Self-pay | Admitting: Internal Medicine

## 2014-08-21 ENCOUNTER — Ambulatory Visit: Payer: BLUE CROSS/BLUE SHIELD | Attending: Internal Medicine | Admitting: Internal Medicine

## 2014-08-21 ENCOUNTER — Encounter: Payer: Self-pay | Admitting: Internal Medicine

## 2014-08-21 VITALS — BP 121/79 | HR 77 | Temp 98.9°F | Resp 16 | Ht 64.0 in | Wt 166.0 lb

## 2014-08-21 DIAGNOSIS — M5441 Lumbago with sciatica, right side: Secondary | ICD-10-CM

## 2014-08-21 DIAGNOSIS — M549 Dorsalgia, unspecified: Secondary | ICD-10-CM | POA: Insufficient documentation

## 2014-08-21 DIAGNOSIS — I10 Essential (primary) hypertension: Secondary | ICD-10-CM | POA: Diagnosis not present

## 2014-08-21 NOTE — Progress Notes (Signed)
Pt is here today still c/o her chronic pain in her mid back that radiating to her chest and up her neck and head. Pt has been coming here with the same issue and she is wanting answers and what she needs to do to fix this pain.

## 2014-08-21 NOTE — Progress Notes (Signed)
Patient ID: Megan Baxter, female   DOB: July 30, 1970, 44 y.o.   MRN: [id number]   Megan Baxter, is a 44 y.o. female  [id number]  [id number]  DOB - 12-06-1970  Chief Complaint  Patient presents with  . Follow-up        Subjective:   Megan Baxter is a 44 y.o. female here today for a follow up visit. Patient has history of hypertension and chronic nonspecific body pain including rib cage pain. Patient has had CT chest with contrast in the past which showed no acute abnormality, she continues to have pain between her shoulders, preventing her from sleep, now ongoing for over a year. Patient also described pain in the left side under her ribs. No history of fall or injury. States pain has been constant, sharp unrelieved by prescribed Flexeril medication. She complained of tightness with movement in neck, denies dizziness and SOB. Pt only speaks [place]. Her husband is interpreter. Patient has No headache, No chest pain, No abdominal pain - No Nausea, No new weakness tingling or numbness, No Cough - SOB.  No problems updated.  ALLERGIES: No Known Allergies  PAST MEDICAL HISTORY: Past Medical History  Diagnosis Date  . Hypertension   . Heartburn in pregnancy     MEDICATIONS AT HOME: Prior to Admission medications   Medication Sig Start Date End Date Taking? Authorizing Provider  diclofenac sodium (VOLTAREN) 1 % GEL Apply 2 g topically 4 (four) times daily. 05/29/14  Yes Tresa Garter, MD  labetalol (NORMODYNE) 300 MG tablet Take 1 tablet (300 mg total) by mouth 2 (two) times daily. 05/03/14  Yes Tresa Garter, MD  acetaminophen-codeine (TYLENOL #3) 300-30 MG per tablet Take 1 tablet by mouth every 4 (four) hours as needed. 05/29/14   Tresa Garter, MD  cyclobenzaprine (FLEXERIL) 10 MG tablet Take 1 tablet (10 mg total) by mouth at bedtime. 05/29/14   Tresa Garter, MD  ferrous sulfate 325 (65 FE) MG tablet Take 1 tablet (325 mg total) by mouth 2 (two) times  daily with a meal. 10/18/13   Reyne Dumas, MD  gabapentin (NEURONTIN) 300 MG capsule Take 1 capsule (300 mg total) by mouth at bedtime. 05/03/14   Tresa Garter, MD  meloxicam (MOBIC) 15 MG tablet TAKE 1 TABLET BY MOUTH DAILY.    Tresa Garter, MD  NIFEdipine (PROCARDIA XL) 60 MG 24 hr tablet Take 2 tabs daily for BP 05/03/14   Tresa Garter, MD     Objective:   Filed Vitals:   08/21/14 1133  BP: 121/79  Pulse: 77  Temp: 98.9 F (37.2 C)  TempSrc: Oral  Resp: 16  Height: 5\' 4"  (1.626 m)  Weight: 166 lb (75.297 kg)  SpO2: 99%    Exam General appearance : Awake, alert, not in any distress. Speech Clear. Not toxic looking HEENT: Atraumatic and Normocephalic, pupils equally reactive to light and accomodation Neck: supple, no JVD. No cervical lymphadenopathy.  Chest:Good air entry bilaterally, no added sounds  CVS: S1 S2 regular, no murmurs.  Abdomen: Bowel sounds present, Non tender and not distended with no gaurding, rigidity or rebound. Extremities: B/L Lower Ext shows no edema, both legs are warm to touch Neurology: Awake alert, and oriented X 3, CN II-XII intact, Non focal Skin:No Rash Wounds:N/A  Data Review No results found for: HGBA1C   Assessment & Plan   1. Midline back pain of unknown cause  - MR Thoracic Spine W Wo Contrast; Future   Patient was  counseled extensively about nutrition and exercise   Return in about 6 months (around 02/19/2015), or if symptoms worsen or fail to improve, for Follow up HTN, Follow up Pain and comorbidities.  The patient was given clear instructions to go to ER or return to medical center if symptoms don't improve, worsen or new problems develop. The patient verbalized understanding. The patient was told to call to get lab results if they haven't heard anything in the next week.   This note has been created with Surveyor, quantity. Any transcriptional errors are  unintentional.    Angelica Chessman, MD, [place], [place], [place] and [place], [place]   08/21/2014, 12:00 PM

## 2014-08-21 NOTE — Patient Instructions (Signed)

## 2014-09-04 ENCOUNTER — Ambulatory Visit (HOSPITAL_COMMUNITY): Admission: RE | Admit: 2014-09-04 | Payer: BC Managed Care – PPO | Source: Ambulatory Visit

## 2014-09-05 ENCOUNTER — Ambulatory Visit (HOSPITAL_COMMUNITY)
Admission: RE | Admit: 2014-09-05 | Discharge: 2014-09-05 | Disposition: A | Discharge status: Home / Self Care | Payer: BLUE CROSS/BLUE SHIELD | Source: Ambulatory Visit | Attending: Internal Medicine | Admitting: Internal Medicine

## 2014-09-05 DIAGNOSIS — M5441 Lumbago with sciatica, right side: Secondary | ICD-10-CM | POA: Insufficient documentation

## 2014-09-05 LAB — CREATININE, SERUM
Creatinine, Ser: 0.79 mg/dL (ref 0.50–1.10)
GFR calc non Af Amer: >90 mL/min (ref >90)

## 2014-09-05 MED ORDER — GADOBENATE DIMEGLUMINE 529 MG/ML IV SOLN
15.0000 mL | Freq: Once | INTRAVENOUS | Status: AC | PRN
  Administered 2014-09-05: 15 mL via INTRAVENOUS

## 2014-10-05 ENCOUNTER — Telehealth: Payer: Self-pay | Admitting: Internal Medicine

## 2014-10-05 NOTE — Telephone Encounter (Signed)
Pt's husband is calling requesting medication refill for NIFEdipine (PROCARDIA XL) 60 MG 24 hr tablet,gabapentin (NEURONTIN) 300 MG capsule,labetalol (NORMODYNE) 300 MG tablet  Pt states that he will no longer use our pharmacy and is [id number]. Pt is also requesting MRI results. Please f/u with pt at phone number provided.

## 2014-10-08 ENCOUNTER — Telehealth: Payer: Self-pay | Admitting: Emergency Medicine

## 2014-10-08 DIAGNOSIS — I10 Essential (primary) hypertension: Secondary | ICD-10-CM

## 2014-10-08 MED ORDER — LABETALOL HCL 300 MG PO TABS: 300.0000 mg | ORAL_TABLET | Freq: Two times a day (BID) | ORAL | Status: DC

## 2014-10-08 MED ORDER — NIFEDIPINE ER OSMOTIC RELEASE 60 MG PO TB24: ORAL_TABLET | ORAL | Status: DC

## 2014-10-08 MED ORDER — GABAPENTIN 300 MG PO CAPS: 300.0000 mg | ORAL_CAPSULE | Freq: Every day | ORAL | Status: DC

## 2014-10-08 NOTE — Telephone Encounter (Signed)
Left message that medication request refilled; e-scribed to [facility] Pt instructed to return call for MRI results

## 2014-10-08 NOTE — Telephone Encounter (Signed)
Patient is returning call from nurse about MRI results, please f/u with pt.

## 2014-10-11 ENCOUNTER — Telehealth: Payer: Self-pay | Admitting: Internal Medicine

## 2014-10-11 ENCOUNTER — Other Ambulatory Visit: Payer: Self-pay | Admitting: *Deleted

## 2014-10-11 DIAGNOSIS — I10 Essential (primary) hypertension: Secondary | ICD-10-CM

## 2014-10-11 MED ORDER — NIFEDIPINE ER OSMOTIC RELEASE 60 MG PO TB24: ORAL_TABLET | ORAL | Status: DC

## 2014-10-11 MED ORDER — GABAPENTIN 300 MG PO CAPS: 300.0000 mg | ORAL_CAPSULE | Freq: Every day | ORAL | Status: DC

## 2014-10-11 MED ORDER — LABETALOL HCL 300 MG PO TABS: 300.0000 mg | ORAL_TABLET | Freq: Two times a day (BID) | ORAL | Status: DC

## 2014-10-11 NOTE — Progress Notes (Signed)
[facility] I reordered her medications [facility]

## 2014-10-11 NOTE — Telephone Encounter (Signed)
Patients husband is calling to speak to nurse about his wife's medications. Please f/u with pt.

## 2014-11-30 LAB — OB RESULTS CONSOLE HIV ANTIBODY (ROUTINE TESTING): HIV: NONREACTIVE

## 2014-11-30 LAB — OB RESULTS CONSOLE HEPATITIS B SURFACE ANTIGEN: Hepatitis B Surface Ag: NEGATIVE

## 2014-11-30 LAB — OB RESULTS CONSOLE GC/CHLAMYDIA
Chlamydia: NEGATIVE
GC PROBE AMP, GENITAL: NEGATIVE

## 2014-11-30 LAB — OB RESULTS CONSOLE ABO/RH: RH TYPE: POSITIVE

## 2014-11-30 LAB — OB RESULTS CONSOLE ANTIBODY SCREEN: Antibody Screen: NEGATIVE

## 2014-11-30 LAB — OB RESULTS CONSOLE RUBELLA ANTIBODY, IGM: RUBELLA: IMMUNE

## 2014-11-30 LAB — OB RESULTS CONSOLE RPR: RPR: NONREACTIVE

## 2015-02-11 ENCOUNTER — Telehealth: Payer: Self-pay | Admitting: Hematology and Oncology

## 2015-02-11 NOTE — Telephone Encounter (Signed)
new patient appt-s/w patient and gave np appt for 07/25 @ 12:30 w/Dr. Alvy Bimler Referring [facility] ob/gyn Dx-severe anemia

## 2015-02-18 ENCOUNTER — Encounter: Payer: Self-pay | Admitting: Hematology and Oncology

## 2015-02-18 ENCOUNTER — Ambulatory Visit: Payer: BLUE CROSS/BLUE SHIELD

## 2015-02-18 ENCOUNTER — Other Ambulatory Visit (HOSPITAL_BASED_OUTPATIENT_CLINIC_OR_DEPARTMENT_OTHER): Payer: BLUE CROSS/BLUE SHIELD

## 2015-02-18 ENCOUNTER — Ambulatory Visit (HOSPITAL_BASED_OUTPATIENT_CLINIC_OR_DEPARTMENT_OTHER): Payer: BLUE CROSS/BLUE SHIELD | Admitting: Hematology and Oncology

## 2015-02-18 VITALS — BP 128/74 | HR 84 | Temp 98.6°F | Resp 18 | Ht 64.0 in | Wt 158.0 lb

## 2015-02-18 DIAGNOSIS — O99013 Anemia complicating pregnancy, third trimester: Secondary | ICD-10-CM

## 2015-02-18 DIAGNOSIS — O99019 Anemia complicating pregnancy, unspecified trimester: Secondary | ICD-10-CM

## 2015-02-18 HISTORY — DX: Anemia complicating pregnancy, unspecified trimester: O99.019

## 2015-02-18 LAB — IRON AND TIBC CHCC
%SAT: 17 % — AB (ref 21–57)
Iron: 77 ug/dL (ref 41–142)
TIBC: 468 ug/dL — ABNORMAL HIGH (ref 236–444)
UIBC: 390 ug/dL — AB (ref 120–384)

## 2015-02-18 LAB — CBC & DIFF AND RETIC
BASO%: 0.5 % (ref 0.0–2.0)
Basophils Absolute: 0.1 10*3/uL (ref 0.0–0.1)
EOS%: 1.7 % (ref 0.0–7.0)
Eosinophils Absolute: 0.2 10*3/uL (ref 0.0–0.5)
HCT: 29 % — ABNORMAL LOW (ref 34.8–46.6)
HGB: 9.5 g/dL — ABNORMAL LOW (ref 11.6–15.9)
Immature Retic Fract: 16.4 % — ABNORMAL HIGH (ref 1.60–10.00)
LYMPH%: 16 % (ref 14.0–49.7)
MCH: 26.9 pg (ref 25.1–34.0)
MCHC: 32.9 g/dL (ref 31.5–36.0)
MCV: 81.7 fL (ref 79.5–101.0)
MONO#: 0.6 10*3/uL (ref 0.1–0.9)
MONO%: 6.1 % (ref 0.0–14.0)
NEUT%: 75.7 % (ref 38.4–76.8)
NEUTROS ABS: 7.4 10*3/uL — AB (ref 1.5–6.5)
NRBC: 0 % (ref 0–0)
Platelets: 384 10*3/uL (ref 145–400)
RBC: 3.55 10*6/uL — ABNORMAL LOW (ref 3.70–5.45)
RDW: 16.2 % — ABNORMAL HIGH (ref 11.2–14.5)
RETIC %: 2.63 % — AB (ref 0.70–2.10)
RETIC CT ABS: 93.37 10*3/uL — AB (ref 33.70–90.70)
WBC: 9.8 10*3/uL (ref 3.9–10.3)
lymph#: 1.6 10*3/uL (ref 0.9–3.3)

## 2015-02-18 LAB — FERRITIN CHCC: FERRITIN: 12 ng/mL (ref 9–269)

## 2015-02-18 NOTE — Progress Notes (Signed)
[place] NOTE  Patient Care Team: Tresa Garter, MD as PCP - General (Internal Medicine)  CHIEF COMPLAINTS/PURPOSE OF CONSULTATION:  Chronic anemia, currently pregnant with her fourth child at third trimester HISTORY OF PRESENTING ILLNESS: history is obtained via an interpreter Megan Baxter 44 y.o. female is here because of severe anemia in pregnancy  She was found to have abnormal CBC from recent blood draw with her primary care doctor. Her blood work from 01/22/2015 show severe anemia with hemoglobin of 8.8 with reactive thrombocytosis The patient is currently pregnant with an expected due date of 05/21/2015 She denies recent chest pain on exertion, pre-syncopal episodes, or palpitations. She does complain of leg cramps and shortness of breath on moderate exertion She had not noticed any recent bleeding such as epistaxis, hematuria or hematochezia The patient denies over the counter NSAID ingestion. She is not on antiplatelets agents. She has 3 other pregnancies in the past and was told she might be anemic. She have heavy menstruation in the past with passage of clots. Her menstrual cycle last 3-4 days every 30 days. She donated blood remotely 3 times about 10 years ago.  She had no prior history or diagnosis of cancer.  She denies any pica and eats a variety of diet. She never received blood transfusion The patient was prescribed oral iron supplements and she takes iron supplement twice a day for the last 3 weeks. She denies any side effects from iron replacement therapy.   MEDICAL HISTORY:  Past Medical History  Diagnosis Date  . Hypertension   . Heartburn in pregnancy   . Maternal anemia in pregnancy, antepartum 02/18/2015    SURGICAL HISTORY: Past Surgical History  Procedure Laterality Date  . No past surgeries    . Cesarean section N/A 04/26/2013    Procedure: CESAREAN SECTION;  Surgeon: Alwyn Pea, MD;  Location: [facility];  Service:  Obstetrics;  Laterality: N/A;    SOCIAL HISTORY: History   Social History  . Marital Status: Married    Spouse Name: N/A  . Number of Children: N/A  . Years of Education: N/A   Occupational History  . Not on file.   Social History Main Topics  . Smoking status: Never Smoker   . Smokeless tobacco: Never Used  . Alcohol Use: No  . Drug Use: No  . Sexual Activity: Not on file   Other Topics Concern  . Not on file   Social History Narrative    FAMILY HISTORY: Family History  Problem Relation Age of Onset  . Hypertension Father   . Alcohol abuse Neg Hx   . Arthritis Neg Hx   . Asthma Neg Hx   . Birth defects Neg Hx   . Cancer Neg Hx   . COPD Neg Hx   . Depression Neg Hx   . Diabetes Neg Hx   . Drug abuse Neg Hx   . Early death Neg Hx   . Hearing loss Neg Hx   . Heart disease Neg Hx   . Hyperlipidemia Neg Hx   . Kidney disease Neg Hx   . Learning disabilities Neg Hx   . Mental illness Neg Hx   . Mental retardation Neg Hx   . Miscarriages / Stillbirths Neg Hx   . Stroke Neg Hx   . Vision loss Neg Hx     ALLERGIES:  has No Known Allergies.  MEDICATIONS:  Current Outpatient Prescriptions  Medication Sig Dispense Refill  . ferrous sulfate  325 (65 FE) MG tablet Take 1 tablet (325 mg total) by mouth 2 (two) times daily with a meal. 60 tablet 3  . labetalol (NORMODYNE) 300 MG tablet Take 1 tablet (300 mg total) by mouth 2 (two) times daily. 180 tablet 3  . meloxicam (MOBIC) 15 MG tablet TAKE 1 TABLET BY MOUTH DAILY. (Patient not taking: Reported on 02/18/2015) 30 tablet 0  . NIFEdipine (PROCARDIA XL) 60 MG 24 hr tablet Take 2 tabs daily for BP (Patient not taking: Reported on 02/18/2015) 180 tablet 3   No current facility-administered medications for this visit.    REVIEW OF SYSTEMS:   Constitutional: Denies fevers, chills or abnormal night sweats Eyes: Denies blurriness of vision, double vision or watery eyes Ears, nose, mouth, throat, and face: Denies  mucositis or sore throat Cardiovascular: Denies palpitation, chest discomfort or lower extremity swelling Gastrointestinal:  Denies nausea, heartburn or change in bowel habits Skin: Denies abnormal skin rashes Lymphatics: Denies new lymphadenopathy or easy bruising Neurological:Denies numbness, tingling or new weaknesses Behavioral/Psych: Mood is stable, no new changes  All other systems were reviewed with the patient and are negative.  PHYSICAL EXAMINATION: ECOG PERFORMANCE STATUS: 1 - Symptomatic but completely ambulatory  Filed Vitals:   02/18/15 1227  BP: 128/74  Pulse: 84  Temp: 98.6 F (37 C)  Resp: 18   Filed Weights   02/18/15 1227  Weight: 158 lb (71.668 kg)    GENERAL:alert, no distress and comfortable SKIN: skin color, texture, turgor are normal, no rashes or significant lesions EYES: normal, conjunctiva are pale and non-injected, sclera clear OROPHARYNX:no exudate, no erythema and lips, buccal mucosa, and tongue normal  NECK: supple, thyroid normal size, non-tender, without nodularity LYMPH:  no palpable lymphadenopathy in the cervical, axillary or inguinal LUNGS: clear to auscultation and percussion with normal breathing effort HEART: regular rate & rhythm and no murmurs and no lower extremity edema ABDOMEN:abdomen soft, non-tender and normal bowel sounds Musculoskeletal:no cyanosis of digits and no clubbing  PSYCH: alert & oriented x 3 with fluent speech NEURO: no focal motor/sensory deficits  LABORATORY DATA:  I have reviewed the data as listed Recent Results (from the past 2160 hour(s))  CBC & Diff and Retic     Status: Abnormal   Collection Time: 02/18/15  1:23 PM  Result Value Ref Range   WBC 9.8 3.9 - 10.3 10e3/uL   NEUT# 7.4 (H) 1.5 - 6.5 10e3/uL   HGB 9.5 (L) 11.6 - 15.9 g/dL   HCT 29.0 (L) 34.8 - 46.6 %   Platelets 384 145 - 400 10e3/uL   MCV 81.7 79.5 - 101.0 fL   MCH 26.9 25.1 - 34.0 pg   MCHC 32.9 31.5 - 36.0 g/dL   RBC 3.55 (L) 3.70 - 5.45  10e6/uL   RDW 16.2 (H) 11.2 - 14.5 %   lymph# 1.6 0.9 - 3.3 10e3/uL   MONO# 0.6 0.1 - 0.9 10e3/uL   Eosinophils Absolute 0.2 0.0 - 0.5 10e3/uL   Basophils Absolute 0.1 0.0 - 0.1 10e3/uL   NEUT% 75.7 38.4 - 76.8 %   LYMPH% 16.0 14.0 - 49.7 %   MONO% 6.1 0.0 - 14.0 %   EOS% 1.7 0.0 - 7.0 %   BASO% 0.5 0.0 - 2.0 %   nRBC 0 0 - 0 %   Retic % 2.63 (H) 0.70 - 2.10 %   Retic Ct Abs 93.37 (H) 33.70 - 90.70 10e3/uL   Immature Retic Fract 16.40 (H) 1.60 - 10.00 %  ASSESSMENT & PLAN:  Maternal anemia in pregnancy, antepartum The most likely cause of anemia is a component of iron deficiency and anemia of chronic disease related to pregnancy. She is doing very well with oral iron supplement. She denies side effects with oral iron supplement. Her blood count has improved since it was last checked by her primary care doctor. At present time, I recommend she continues taking iron supplement twice a day until after delivery. If she ended up needing cesarean section, I would recommend she extend the duration of iron supplement for additional 6 months after delivery. If the patient plan on breast-feeding her child, she would need to continue iron supplement until the baby is weaned off. I have not made return appointment for the patient to come back and she does not need any further investigation or treatment on my end. I would be happy to see her in the future if needed    All questions were answered. The patient knows to call the clinic with any problems, questions or concerns. I spent 30 minutes counseling the patient face to face. The total time spent in the appointment was 40 minutes and more than 50% was on counseling.     [facility], [place], MD 02/18/2015 2:22 PM

## 2015-02-18 NOTE — Assessment & Plan Note (Signed)
The most likely cause of anemia is a component of iron deficiency and anemia of chronic disease related to pregnancy. She is doing very well with oral iron supplement. She denies side effects with oral iron supplement. Her blood count has improved since it was last checked by her primary care doctor. At present time, I recommend she continues taking iron supplement twice a day until after delivery. If she ended up needing cesarean section, I would recommend she extend the duration of iron supplement for additional 6 months after delivery. If the patient plan on breast-feeding her child, she would need to continue iron supplement until the baby is weaned off. I have not made return appointment for the patient to come back and she does not need any further investigation or treatment on my end. I would be happy to see her in the future if needed

## 2015-02-18 NOTE — Progress Notes (Signed)
Checked in new pt with no financial concerns prior to seeing the dr.

## 2015-04-24 LAB — OB RESULTS CONSOLE GBS: GBS: NEGATIVE

## 2015-05-09 ENCOUNTER — Telehealth (HOSPITAL_COMMUNITY): Payer: Self-pay | Admitting: *Deleted

## 2015-05-09 ENCOUNTER — Encounter (HOSPITAL_COMMUNITY): Payer: Self-pay | Admitting: *Deleted

## 2015-05-09 NOTE — Telephone Encounter (Signed)
Preadmission screen Interpreter number [phone]

## 2015-05-11 ENCOUNTER — Inpatient Hospital Stay (HOSPITAL_COMMUNITY): Admission: RE | Admit: 2015-05-11 | Payer: BLUE CROSS/BLUE SHIELD | Source: Ambulatory Visit

## 2015-05-23 ENCOUNTER — Encounter (HOSPITAL_COMMUNITY): Payer: Self-pay

## 2015-05-23 ENCOUNTER — Inpatient Hospital Stay (HOSPITAL_COMMUNITY)
Admission: RE | Admit: 2015-05-23 | Discharge: 2015-05-27 | DRG: 765 | Disposition: A | Discharge status: Home / Self Care | Payer: BLUE CROSS/BLUE SHIELD | Source: Ambulatory Visit | Attending: Obstetrics and Gynecology | Admitting: Obstetrics and Gynecology

## 2015-05-23 DIAGNOSIS — D649 Anemia, unspecified: Secondary | ICD-10-CM

## 2015-05-23 DIAGNOSIS — O321XX0 Maternal care for breech presentation, not applicable or unspecified: Secondary | ICD-10-CM | POA: Diagnosis present

## 2015-05-23 DIAGNOSIS — O09529 Supervision of elderly multigravida, unspecified trimester: Secondary | ICD-10-CM

## 2015-05-23 DIAGNOSIS — O480 Post-term pregnancy: Principal | ICD-10-CM | POA: Diagnosis not present

## 2015-05-23 DIAGNOSIS — O1092 Unspecified pre-existing hypertension complicating childbirth: Secondary | ICD-10-CM | POA: Diagnosis present

## 2015-05-23 DIAGNOSIS — O9902 Anemia complicating childbirth: Secondary | ICD-10-CM | POA: Diagnosis present

## 2015-05-23 DIAGNOSIS — E7439 Other disorders of intestinal carbohydrate absorption: Secondary | ICD-10-CM | POA: Diagnosis present

## 2015-05-23 DIAGNOSIS — Z3A41 41 weeks gestation of pregnancy: Secondary | ICD-10-CM | POA: Diagnosis not present

## 2015-05-23 LAB — CBC
HCT: 30.1 % — ABNORMAL LOW (ref 36.0–46.0)
HEMOGLOBIN: 10 g/dL — AB (ref 12.0–15.0)
MCH: 27.2 pg (ref 26.0–34.0)
MCHC: 33.2 g/dL (ref 30.0–36.0)
MCV: 82 fL (ref 78.0–100.0)
PLATELETS: 307 10*3/uL (ref 150–400)
RBC: 3.67 MIL/uL — AB (ref 3.87–5.11)
RDW: 15.2 % (ref 11.5–15.5)
WBC: 6.1 10*3/uL (ref 4.0–10.5)

## 2015-05-23 LAB — COMPREHENSIVE METABOLIC PANEL
ALT: 13 U/L — AB (ref 14–54)
AST: 17 U/L (ref 15–41)
Albumin: 3.2 g/dL — ABNORMAL LOW (ref 3.5–5.0)
Alkaline Phosphatase: 143 U/L — ABNORMAL HIGH (ref 38–126)
Anion gap: 6 (ref 5–15)
BILIRUBIN TOTAL: 0.9 mg/dL (ref 0.3–1.2)
BUN: 11 mg/dL (ref 6–20)
CHLORIDE: 107 mmol/L (ref 101–111)
CO2: 22 mmol/L (ref 22–32)
CREATININE: 0.48 mg/dL (ref 0.44–1.00)
Calcium: 9.4 mg/dL (ref 8.9–10.3)
GFR calc Af Amer: >60 mL/min (ref >60)
GLUCOSE: 80 mg/dL (ref 65–99)
POTASSIUM: 3.6 mmol/L (ref 3.5–5.1)
Sodium: 135 mmol/L (ref 135–145)
Total Protein: 7.2 g/dL (ref 6.5–8.1)

## 2015-05-23 LAB — TYPE AND SCREEN
ABO/RH(D): B POS
Antibody Screen: NEGATIVE

## 2015-05-23 LAB — PROTEIN / CREATININE RATIO, URINE
Creatinine, Urine: 44 mg/dL
Protein Creatinine Ratio: 0.16 mg/mg{Cre} — ABNORMAL HIGH (ref 0.00–0.15)
Total Protein, Urine: 7 mg/dL

## 2015-05-23 LAB — RPR: RPR: NONREACTIVE

## 2015-05-23 LAB — URIC ACID: URIC ACID, SERUM: 6 mg/dL (ref 2.3–6.6)

## 2015-05-23 LAB — ABO/RH: ABO/RH(D): B POS

## 2015-05-23 MED ORDER — LACTATED RINGERS IV SOLN
INTRAVENOUS | Status: DC
  Administered 2015-05-23 – 2015-05-24 (×4): via INTRAVENOUS

## 2015-05-23 MED ORDER — LABETALOL HCL 200 MG PO TABS
300.0000 mg | ORAL_TABLET | Freq: Two times a day (BID) | ORAL | Status: DC
  Administered 2015-05-24: 300 mg via ORAL
  Filled 2015-05-23 (×4): qty 1

## 2015-05-23 MED ORDER — ONDANSETRON HCL 4 MG/2ML IJ SOLN: 4.0000 mg | Freq: Four times a day (QID) | INTRAMUSCULAR | Status: DC | PRN

## 2015-05-23 MED ORDER — FENTANYL CITRATE (PF) 100 MCG/2ML IJ SOLN
100.0000 ug | INTRAMUSCULAR | Status: DC | PRN
  Administered 2015-05-23 – 2015-05-24 (×3): 100 ug via INTRAVENOUS
  Filled 2015-05-23 (×3): qty 2

## 2015-05-23 MED ORDER — OXYTOCIN 40 UNITS IN LACTATED RINGERS INFUSION - SIMPLE MED: 62.5000 mL/h | INTRAVENOUS | Status: DC

## 2015-05-23 MED ORDER — LIDOCAINE HCL (PF) 1 % IJ SOLN: 30.0000 mL | INTRAMUSCULAR | Status: DC | PRN

## 2015-05-23 MED ORDER — OXYCODONE-ACETAMINOPHEN 5-325 MG PO TABS: 1.0000 | ORAL_TABLET | ORAL | Status: DC | PRN

## 2015-05-23 MED ORDER — CITRIC ACID-SODIUM CITRATE 334-500 MG/5ML PO SOLN
30.0000 mL | ORAL | Status: DC | PRN
  Administered 2015-05-24: 30 mL via ORAL
  Filled 2015-05-23: qty 15

## 2015-05-23 MED ORDER — LACTATED RINGERS IV SOLN
500.0000 mL | INTRAVENOUS | Status: DC | PRN
  Administered 2015-05-23 – 2015-05-24 (×4): 1000 mL via INTRAVENOUS

## 2015-05-23 MED ORDER — OXYTOCIN 40 UNITS IN LACTATED RINGERS INFUSION - SIMPLE MED
1.0000 m[IU]/min | INTRAVENOUS | Status: DC
  Administered 2015-05-23: 2 m[IU]/min via INTRAVENOUS
  Filled 2015-05-23: qty 1000

## 2015-05-23 MED ORDER — ACETAMINOPHEN 325 MG PO TABS: 650.0000 mg | ORAL_TABLET | ORAL | Status: DC | PRN

## 2015-05-23 MED ORDER — FLEET ENEMA 7-19 GM/118ML RE ENEM: 1.0000 | ENEMA | RECTAL | Status: DC | PRN

## 2015-05-23 MED ORDER — OXYCODONE-ACETAMINOPHEN 5-325 MG PO TABS: 2.0000 | ORAL_TABLET | ORAL | Status: DC | PRN

## 2015-05-23 MED ORDER — FENTANYL CITRATE (PF) 100 MCG/2ML IJ SOLN: 12.5000 ug | Freq: Once | INTRAMUSCULAR | Status: DC

## 2015-05-23 MED ORDER — LABETALOL HCL 300 MG PO TABS
300.0000 mg | ORAL_TABLET | Freq: Two times a day (BID) | ORAL | Status: DC
  Administered 2015-05-23: 300 mg via ORAL
  Filled 2015-05-23 (×2): qty 1

## 2015-05-23 MED ORDER — TERBUTALINE SULFATE 1 MG/ML IJ SOLN: 0.2500 mg | Freq: Once | INTRAMUSCULAR | Status: DC | PRN

## 2015-05-23 MED ORDER — OXYTOCIN BOLUS FROM INFUSION: 500.0000 mL | INTRAVENOUS | Status: DC

## 2015-05-23 NOTE — Progress Notes (Signed)
Megan Baxter is a 44 y.o. [id number] at [redacted]w[redacted]d admitted for induction of labor due to Hypertension on Labetalol 300 mg BID.  Subjective:  Comfortable with  Labor support without medications Contractions every 8 minutes, lasting 30 seconds, intensity 4/10 spontaneously Reports good FM Denies LOF or bleeding     Objective: BP 140/91 mmHg  Pulse 81  Temp(Src) 98.8 F (37.1 C)  Resp 18  LMP 08/10/2014      FHT:  FHR: 140 bpm, variability: moderate,  accelerations:  Present,  decelerations:  Absent SVE:   Dilation: 3 / 50 / -2 Exam by:: dr Braxtyn Dorff  Bedside ultrasound confirms anterior placenta with amniotic fluid upper limit of normal predominantly at fundus. Head is well applied to membranes. Cord is not seen below mid-chest.  Labs: Lab Results  Component Value Date   WBC 6.1 05/23/2015   HGB 10.0* 05/23/2015   HCT 30.1* 05/23/2015   MCV 82.0 05/23/2015   PLT 307 05/23/2015    Assessment / Plan: Spontaneous early labor Offered AROM with R&B reviewed including cord prolapse needing emergent cesarean section. Patient is agreeable. AROM performed with pudendal block needle: clear abundant fluid and vertex descending well in pelvis with reassuring monitoring Fetal Wellbeing: reassuring Anticipated MOD:  NSVD  Megan Baxter A 05/23/2015, 2:32 PM

## 2015-05-23 NOTE — Progress Notes (Addendum)
Megan Baxter MRN: [id number]  Subjective: -Nurse call reports patient with variable decelerations.  In room to assess.  Husband out of room, Interpreter called.  Patient reports contraction pain, but denies rectal or vaginal pressure.  Patient continues to question how long induction process will take.  Objective: BP 144/88 mmHg  Pulse 81  Temp(Src) 97.6 F (36.4 C) (Oral)  Resp 18  Ht 5\' 5"  (1.651 m)  Wt 74.844 kg (165 lb)  BMI 27.46 kg/m2  LMP 08/10/2014     FHT: 135 bpm, Mod Var, + Late Decels, +Accels UC: Q4-48min, palpates moderate   SVE:   Dilation: 4 Effacement (%): 50 Station: -2 Exam by:: Megan Baxter, CNM Membranes: AROM x 8hrs IUPC inserted FSE attempted-unsuccessful Pitocin: 81mUn/min  Assessment:  IUP at 40.6wks Cat II FT  IOL CHTN  Plan: -FSE applied by nurse -Discussed insertion of IUPC, patient agrees -FSE dislodged (x2) and EFM utilized -Position change resulted in resolution of Cat II FT -Reassurances given regarding induction process -Patient continues to decline epidural -Continue other mgmt as ordered -Megan Baxter updated on patient status  Megan Funderburke LYNN,MSN, CNM 05/23/2015, 11:33 PM   Addendum: Strip and Chart reviewed O:  Filed Vitals:   05/24/15 0000  BP: 164/98  Pulse: 79  Temp:   Resp: 18   A: CHTN IOL  P: IV labetalol orders placed per protocol Nurse instructed to retake BP at [place] and treat if necessary MVUs adequate at 200-235; hold pitocin at 21mUn/min as long as fetal tolerance noted  Megan Baxter, [place] MSN, CNM  12:24 AM

## 2015-05-23 NOTE — H&P (Signed)
Megan Baxter is a 44 y.o. female, S2G3151 at  48 6/7 weeks, presenting for Post dates induction.  [place].  Chronic hypertension and glucose intolerance, and polyhydramnios during this pregnancy. All growth paerameters for fetus are lagging.     Has a history of cesarean section. Pregnancy  #1 and 2 SVD #3 2014 LTCS with 2 layer closure--Hypertensive crisis at 36 4/7 weeks (Dr. Cletis Media).   VBAC papers signed 02/26/15.   Language barrier- Speaks [place]. Translator needed   Patient Active Problem List   Diagnosis Date Noted  . Pregnancy 05/23/2015  . Chronic hypertension in pregnancy 05/23/2015  . Advanced maternal age in multigravida 05/23/2015  . Insufficient prenatal care - late entry to care at 17 wks 05/23/2015  . Post-dates pregnancy 05/23/2015  . Anemia 05/23/2015  . Polyhydramnios 05/23/2015  . Maternal anemia in pregnancy, antepartum 02/18/2015    History of present pregnancy: Patient entered care at 25 6/7weeks.   EDC of 05/17/15 was established by LMP.   Anatomy scan:  19 4/7 weeks, with normal findings and an anterior placenta.   Additional [place] evaluations:    23 4/7 01/22/15    Anatomy Follow up -  Nelda [place] pregnancy. Breech presentation,  Anterior placenta, fluid is normal. Vertical pocket= 5.8cm. Cervix closed.  Ductal arch seen. Anatomy complete Significant prenatal events:   04/24/15 BPP  8/8  In 20 min 38 w 05/03/15  BPP 8/8 AFI  75th%,   [redacted]w[redacted]d   05/14/15   AFI is 97th%, BPP 8/8, Growth 12.1% All parameters are lagging [redacted]w[redacted]d 05/22/15   AFI 97%,  8/8 Last evaluation:  05/22/15  OB History    Gravida Para Term Preterm AB TAB SAB Ectopic Multiple Living   4 3 2 1  0 0 0 0 0 3     Past Medical History  Diagnosis Date  . Hypertension   . Heartburn in pregnancy   . Maternal anemia in pregnancy, antepartum 02/18/2015   Past Surgical History  Procedure Laterality Date  . No past surgeries    . Cesarean section N/A 04/26/2013    Procedure: CESAREAN SECTION;  Surgeon:  Alwyn Pea, MD;  Location: [facility];  Service: Obstetrics;  Laterality: N/A;   Family History: family history includes Hypertension in her father. There is no history of Alcohol abuse, Arthritis, Asthma, Birth defects, Cancer, COPD, Depression, Diabetes, Drug abuse, Early death, Hearing loss, Heart disease, Hyperlipidemia, Kidney disease, Learning disabilities, Mental illness, Mental retardation, Miscarriages / Stillbirths, Stroke, or Vision loss. Social History:  reports that she has never smoked. She has never used smokeless tobacco. She reports that she does not drink alcohol or use illicit drugs.   Prenatal Transfer Tool  Maternal Diabetes: No Genetic Screening: Normal Maternal Ultrasounds/Referrals: Normal Fetal Ultrasounds or other Referrals:  None Maternal Substance Abuse:  No Significant Maternal Medications:  Meds include: Other: labatalol 100mg  TID Significant Maternal Lab Results: Lab values include: Group B Strep negative  TDAP no records Flu  No records ROS:  See above  No Known Allergies   Dilation: 4 Station: Ballotable Exam by:: j. emily Blood pressure 148/95, pulse 85, temperature 98.1 F (36.7 C), temperature source Oral, resp. rate 18, height 5\' 5"  (1.651 m), weight 74.844 kg (165 lb), last menstrual period 08/10/2014, currently breastfeeding.  Chest clear Heart RRR without murmur Abd gravid, NT, FH 41 cm on  Pelvic:  Per Dr. Cletis Media  1/50/-2  On  05/22/15 Ext: wnl  FHR: Category 1 UCs:  Irregular , every 8-63min  Prenatal labs: ABO, Rh: --/--/B POS, B POS (10/27 0900) Antibody: NEG (10/27 0900) Rubella:  !Error!   Immune RPR: Non Reactive (10/27 0900)  HBsAg: Negative (05/06 0000)  HIV: Non-reactive (05/06 0000)  GBS: Negative (09/28 0000) Sickle cell/Hgb electrophoresis:  normal  Pap:  normal GC:  negative Chlamydia:  negative Genetic screenings:  Quad normal Glucola:  1 hr elevated at 173, 3 hr GTT unknown Other:   Hgb 9.8 at NOB, 8.8 at 28  weeks   Assessment/Plan: IUP at 40 6/7 weeks Polyhydramnios GBS negative Chronic Hypertension History cesarean section Desires TOLAC - consents signed  02/26/15   Plan: Admit to [facility] per consult with Dr. Cletis Media Induction of labor - Foley Bulb  Routine [facility] orders Pain medication/ Epidural PRM    Cherre Huger, [place] 05/23/2015, 8:29 PM

## 2015-05-23 NOTE — Progress Notes (Addendum)
Megan Baxter MRN: [id number]  Subjective: -Care assumed of 44 y.o. N5A2130 at [redacted]w[redacted]d who presents for IOL secondary to [facility].  Patient resting in bed and reports some discomfort with contractions.  Patient questions how long process will take. Patient reports not liking epidural and will utilize IV medication for pain mgmt. Husband at bedside acting as interpreter.  Pregnancy also significant for  Patient Active Problem List   Diagnosis Date Noted  . Pregnancy 05/23/2015  . Chronic hypertension in pregnancy 05/23/2015  . Maternal anemia in pregnancy, antepartum 02/18/2015  . Midline low back pain with right-sided sciatica 05/29/2014  . Chest pain 12/12/2013  . Back pain 12/12/2013  . Cesarean delivery delivered 04/28/2013  . IUGR (intrauterine growth restriction) 04/28/2013  . HTN (hypertension) 10/26/2012    Objective: BP 141/92 mmHg  Pulse 74  Temp(Src) 98.1 F (36.7 C) (Oral)  Resp 18  Ht 5\' 5"  (1.651 m)  Wt 74.844 kg (165 lb)  BMI 27.46 kg/m2  LMP 08/10/2014     FHT: 125 bpm, Mod Var, + Occassional Variable Decels, +Accels UC: Q3-62min, palpates mild   SVE:   Dilation: 4 Station: Ballotable Exam by:: j. Eliasar Hlavaty Membranes: AROM x 6hrs Pitocin:61mUn/min  Assessment:  IUP at 40.6wks Cat I FT  CHTN IOL VBAC  Plan: -Discussed induction process and risk for serial induction.  -Informed of availability of IV pain medication -Will recheck in 4 hours for progress -Continue other mgmt as ordered  Ettamae Barkett LYNN,MSN, CNM 05/23/2015, 7:59 PM

## 2015-05-24 ENCOUNTER — Encounter (HOSPITAL_COMMUNITY): Payer: Self-pay

## 2015-05-24 ENCOUNTER — Encounter (HOSPITAL_COMMUNITY)
Admission: RE | Disposition: A | Discharge status: Home / Self Care | Payer: Self-pay | Source: Ambulatory Visit | Attending: Obstetrics and Gynecology

## 2015-05-24 ENCOUNTER — Inpatient Hospital Stay (HOSPITAL_COMMUNITY): Payer: BLUE CROSS/BLUE SHIELD | Admitting: Anesthesiology

## 2015-05-24 LAB — CBC
HCT: 27.9 % — ABNORMAL LOW (ref 36.0–46.0)
HEMOGLOBIN: 9.2 g/dL — AB (ref 12.0–15.0)
MCH: 27 pg (ref 26.0–34.0)
MCHC: 33 g/dL (ref 30.0–36.0)
MCV: 81.8 fL (ref 78.0–100.0)
Platelets: 267 10*3/uL (ref 150–400)
RBC: 3.41 MIL/uL — AB (ref 3.87–5.11)
RDW: 15.1 % (ref 11.5–15.5)
WBC: 13.9 10*3/uL — AB (ref 4.0–10.5)

## 2015-05-24 SURGERY — Surgical Case
Anesthesia: Epidural

## 2015-05-24 MED ORDER — SENNOSIDES-DOCUSATE SODIUM 8.6-50 MG PO TABS
2.0000 | ORAL_TABLET | ORAL | Status: DC
  Administered 2015-05-25 – 2015-05-26 (×2): 2 via ORAL
  Filled 2015-05-24 (×4): qty 2

## 2015-05-24 MED ORDER — ONDANSETRON HCL 4 MG/2ML IJ SOLN
INTRAMUSCULAR | Status: DC | PRN
  Administered 2015-05-24: 4 mg via INTRAVENOUS

## 2015-05-24 MED ORDER — LACTATED RINGERS IV SOLN
INTRAVENOUS | Status: DC
  Administered 2015-05-24: 150 mL via INTRAUTERINE

## 2015-05-24 MED ORDER — MORPHINE SULFATE (PF) 0.5 MG/ML IJ SOLN
INTRAMUSCULAR | Status: DC | PRN
  Administered 2015-05-24: 1 mg via INTRAVENOUS
  Administered 2015-05-24: 4 mg via EPIDURAL

## 2015-05-24 MED ORDER — KETOROLAC TROMETHAMINE 30 MG/ML IJ SOLN
30.0000 mg | Freq: Four times a day (QID) | INTRAMUSCULAR | Status: DC | PRN
  Administered 2015-05-24: 30 mg via INTRAMUSCULAR

## 2015-05-24 MED ORDER — LANOLIN HYDROUS EX OINT: 1.0000 "application " | TOPICAL_OINTMENT | CUTANEOUS | Status: DC | PRN

## 2015-05-24 MED ORDER — IBUPROFEN 600 MG PO TABS
600.0000 mg | ORAL_TABLET | Freq: Four times a day (QID) | ORAL | Status: DC
  Administered 2015-05-25 – 2015-05-27 (×10): 600 mg via ORAL
  Filled 2015-05-24 (×10): qty 1

## 2015-05-24 MED ORDER — OXYCODONE-ACETAMINOPHEN 5-325 MG PO TABS
1.0000 | ORAL_TABLET | ORAL | Status: DC | PRN
  Administered 2015-05-25 – 2015-05-27 (×2): 1 via ORAL
  Filled 2015-05-24 (×2): qty 1

## 2015-05-24 MED ORDER — ONDANSETRON HCL 4 MG/2ML IJ SOLN: 4.0000 mg | Freq: Three times a day (TID) | INTRAMUSCULAR | Status: DC | PRN

## 2015-05-24 MED ORDER — ACETAMINOPHEN 325 MG PO TABS: 650.0000 mg | ORAL_TABLET | ORAL | Status: DC | PRN

## 2015-05-24 MED ORDER — MORPHINE SULFATE (PF) 0.5 MG/ML IJ SOLN
INTRAMUSCULAR | Status: AC
  Filled 2015-05-24: qty 100

## 2015-05-24 MED ORDER — ZOLPIDEM TARTRATE 5 MG PO TABS: 5.0000 mg | ORAL_TABLET | Freq: Every evening | ORAL | Status: DC | PRN

## 2015-05-24 MED ORDER — LACTATED RINGERS IV SOLN
INTRAVENOUS | Status: DC
  Administered 2015-05-25: 1 mL via INTRAVENOUS
  Administered 2015-05-25: 02:00:00 via INTRAVENOUS

## 2015-05-24 MED ORDER — KETOROLAC TROMETHAMINE 30 MG/ML IJ SOLN
INTRAMUSCULAR | Status: AC
Start: 2015-05-24 — End: 2015-05-24
  Administered 2015-05-24: 30 mg via INTRAMUSCULAR
  Filled 2015-05-24: qty 1

## 2015-05-24 MED ORDER — NALBUPHINE HCL 10 MG/ML IJ SOLN
5.0000 mg | INTRAMUSCULAR | Status: DC | PRN
  Administered 2015-05-25: 5 mg via INTRAVENOUS
  Filled 2015-05-24: qty 1

## 2015-05-24 MED ORDER — PHENYLEPHRINE 40 MCG/ML (10ML) SYRINGE FOR IV PUSH (FOR BLOOD PRESSURE SUPPORT): 80.0000 ug | PREFILLED_SYRINGE | INTRAVENOUS | Status: DC | PRN

## 2015-05-24 MED ORDER — MEPERIDINE HCL 25 MG/ML IJ SOLN
INTRAMUSCULAR | Status: AC
  Filled 2015-05-24: qty 1

## 2015-05-24 MED ORDER — OXYTOCIN 40 UNITS IN LACTATED RINGERS INFUSION - SIMPLE MED
1.0000 m[IU]/min | INTRAVENOUS | Status: DC
  Administered 2015-05-24: 5 m[IU]/min via INTRAVENOUS

## 2015-05-24 MED ORDER — SODIUM BICARBONATE 8.4 % IV SOLN
INTRAVENOUS | Status: AC
Start: 2015-05-24 — End: 2015-05-24
  Filled 2015-05-24: qty 50

## 2015-05-24 MED ORDER — NALBUPHINE HCL 10 MG/ML IJ SOLN
5.0000 mg | Freq: Once | INTRAMUSCULAR | Status: DC | PRN
Start: 2015-05-24 — End: 2015-05-27

## 2015-05-24 MED ORDER — DIPHENHYDRAMINE HCL 25 MG PO CAPS
25.0000 mg | ORAL_CAPSULE | ORAL | Status: DC | PRN
  Filled 2015-05-24: qty 1

## 2015-05-24 MED ORDER — LIDOCAINE HCL (PF) 1 % IJ SOLN
INTRAMUSCULAR | Status: DC | PRN
  Administered 2015-05-24 (×2): 8 mL via EPIDURAL

## 2015-05-24 MED ORDER — OXYTOCIN 40 UNITS IN LACTATED RINGERS INFUSION - SIMPLE MED
62.5000 mL/h | INTRAVENOUS | Status: AC
Start: 2015-05-24 — End: 2015-05-25

## 2015-05-24 MED ORDER — LABETALOL HCL 300 MG PO TABS
300.0000 mg | ORAL_TABLET | Freq: Two times a day (BID) | ORAL | Status: DC
  Administered 2015-05-25 – 2015-05-27 (×5): 300 mg via ORAL
  Filled 2015-05-24 (×6): qty 1

## 2015-05-24 MED ORDER — 0.9 % SODIUM CHLORIDE (POUR BTL) OPTIME
TOPICAL | Status: DC | PRN
  Administered 2015-05-24: 1000 mL

## 2015-05-24 MED ORDER — SODIUM BICARBONATE 8.4 % IV SOLN
INTRAVENOUS | Status: DC | PRN
  Administered 2015-05-24 (×3): 5 mL via EPIDURAL

## 2015-05-24 MED ORDER — SIMETHICONE 80 MG PO CHEW: 80.0000 mg | CHEWABLE_TABLET | ORAL | Status: DC | PRN

## 2015-05-24 MED ORDER — PRENATAL MULTIVITAMIN CH
1.0000 | ORAL_TABLET | Freq: Every day | ORAL | Status: DC
  Administered 2015-05-25 – 2015-05-27 (×3): 1 via ORAL
  Filled 2015-05-24 (×3): qty 1

## 2015-05-24 MED ORDER — OXYTOCIN 10 UNIT/ML IJ SOLN
INTRAMUSCULAR | Status: AC
  Filled 2015-05-24: qty 4

## 2015-05-24 MED ORDER — OXYCODONE-ACETAMINOPHEN 5-325 MG PO TABS
2.0000 | ORAL_TABLET | ORAL | Status: DC | PRN
  Administered 2015-05-26 – 2015-05-27 (×2): 2 via ORAL
  Filled 2015-05-24 (×2): qty 2

## 2015-05-24 MED ORDER — ONDANSETRON HCL 4 MG/2ML IJ SOLN
INTRAMUSCULAR | Status: AC
  Filled 2015-05-24: qty 2

## 2015-05-24 MED ORDER — NALOXONE HCL 0.4 MG/ML IJ SOLN: 0.4000 mg | INTRAMUSCULAR | Status: DC | PRN

## 2015-05-24 MED ORDER — TETANUS-DIPHTH-ACELL PERTUSSIS 5-2.5-18.5 LF-MCG/0.5 IM SUSP
0.5000 mL | Freq: Once | INTRAMUSCULAR | Status: DC
  Filled 2015-05-24: qty 0.5

## 2015-05-24 MED ORDER — NALBUPHINE HCL 10 MG/ML IJ SOLN: 5.0000 mg | INTRAMUSCULAR | Status: DC | PRN

## 2015-05-24 MED ORDER — LABETALOL HCL 5 MG/ML IV SOLN: 20.0000 mg | INTRAVENOUS | Status: DC | PRN

## 2015-05-24 MED ORDER — WITCH HAZEL-GLYCERIN EX PADS: 1.0000 "application " | MEDICATED_PAD | CUTANEOUS | Status: DC | PRN

## 2015-05-24 MED ORDER — DIPHENHYDRAMINE HCL 50 MG/ML IJ SOLN
12.5000 mg | INTRAMUSCULAR | Status: DC | PRN
  Administered 2015-05-24: 12.5 mg via INTRAVENOUS
  Filled 2015-05-24: qty 1

## 2015-05-24 MED ORDER — PHENYLEPHRINE 40 MCG/ML (10ML) SYRINGE FOR IV PUSH (FOR BLOOD PRESSURE SUPPORT)
PREFILLED_SYRINGE | INTRAVENOUS | Status: AC
  Filled 2015-05-24: qty 20

## 2015-05-24 MED ORDER — DIPHENHYDRAMINE HCL 50 MG/ML IJ SOLN: 12.5000 mg | INTRAMUSCULAR | Status: DC | PRN

## 2015-05-24 MED ORDER — KETOROLAC TROMETHAMINE 30 MG/ML IJ SOLN: 30.0000 mg | Freq: Four times a day (QID) | INTRAMUSCULAR | Status: DC | PRN

## 2015-05-24 MED ORDER — CEFAZOLIN SODIUM-DEXTROSE 2-3 GM-% IV SOLR
2.0000 g | Freq: Once | INTRAVENOUS | Status: AC
  Administered 2015-05-24: 2 g via INTRAVENOUS
  Filled 2015-05-24: qty 50

## 2015-05-24 MED ORDER — LIDOCAINE-EPINEPHRINE (PF) 2 %-1:200000 IJ SOLN
INTRAMUSCULAR | Status: AC
  Filled 2015-05-24: qty 20

## 2015-05-24 MED ORDER — FENTANYL 2.5 MCG/ML BUPIVACAINE 1/10 % EPIDURAL INFUSION (WH - ANES)
14.0000 mL/h | INTRAMUSCULAR | Status: DC | PRN
  Administered 2015-05-24 (×3): 14 mL/h via EPIDURAL
  Filled 2015-05-24: qty 125

## 2015-05-24 MED ORDER — EPHEDRINE 5 MG/ML INJ: 10.0000 mg | INTRAVENOUS | Status: DC | PRN

## 2015-05-24 MED ORDER — DIPHENHYDRAMINE HCL 25 MG PO CAPS
25.0000 mg | ORAL_CAPSULE | Freq: Four times a day (QID) | ORAL | Status: DC | PRN
  Filled 2015-05-24: qty 1

## 2015-05-24 MED ORDER — MEPERIDINE HCL 25 MG/ML IJ SOLN: 6.2500 mg | INTRAMUSCULAR | Status: DC | PRN

## 2015-05-24 MED ORDER — OXYTOCIN 10 UNIT/ML IJ SOLN
40.0000 [IU] | INTRAVENOUS | Status: DC | PRN
  Administered 2015-05-24: 40 [IU] via INTRAVENOUS

## 2015-05-24 MED ORDER — DIBUCAINE 1 % RE OINT
1.0000 "application " | TOPICAL_OINTMENT | RECTAL | Status: DC | PRN
  Filled 2015-05-24: qty 28

## 2015-05-24 MED ORDER — HYDRALAZINE HCL 20 MG/ML IJ SOLN: 10.0000 mg | Freq: Once | INTRAMUSCULAR | Status: DC | PRN

## 2015-05-24 MED ORDER — NALOXONE HCL 2 MG/2ML IJ SOSY
1.0000 ug/kg/h | PREFILLED_SYRINGE | INTRAMUSCULAR | Status: DC | PRN
  Filled 2015-05-24: qty 2

## 2015-05-24 MED ORDER — SIMETHICONE 80 MG PO CHEW
80.0000 mg | CHEWABLE_TABLET | ORAL | Status: DC
  Administered 2015-05-25 – 2015-05-26 (×2): 80 mg via ORAL
  Filled 2015-05-24 (×2): qty 1

## 2015-05-24 MED ORDER — MEPERIDINE HCL 25 MG/ML IJ SOLN
INTRAMUSCULAR | Status: DC | PRN
  Administered 2015-05-24 (×2): 12.5 mg via INTRAVENOUS

## 2015-05-24 MED ORDER — FENTANYL CITRATE (PF) 100 MCG/2ML IJ SOLN: 25.0000 ug | INTRAMUSCULAR | Status: DC | PRN

## 2015-05-24 MED ORDER — SODIUM CHLORIDE 0.9 % IJ SOLN: 3.0000 mL | INTRAMUSCULAR | Status: DC | PRN

## 2015-05-24 MED ORDER — SCOPOLAMINE 1 MG/3DAYS TD PT72: 1.0000 | MEDICATED_PATCH | Freq: Once | TRANSDERMAL | Status: DC

## 2015-05-24 MED ORDER — SIMETHICONE 80 MG PO CHEW
80.0000 mg | CHEWABLE_TABLET | Freq: Three times a day (TID) | ORAL | Status: DC
  Administered 2015-05-25 – 2015-05-27 (×6): 80 mg via ORAL
  Filled 2015-05-24 (×10): qty 1

## 2015-05-24 MED ORDER — FENTANYL 2.5 MCG/ML BUPIVACAINE 1/10 % EPIDURAL INFUSION (WH - ANES)
INTRAMUSCULAR | Status: AC
  Administered 2015-05-24: 14 mL/h via EPIDURAL
  Filled 2015-05-24: qty 125

## 2015-05-24 MED ORDER — IBUPROFEN 600 MG PO TABS: 600.0000 mg | ORAL_TABLET | Freq: Four times a day (QID) | ORAL | Status: DC | PRN

## 2015-05-24 MED ORDER — MENTHOL 3 MG MT LOZG: 1.0000 | LOZENGE | OROMUCOSAL | Status: DC | PRN

## 2015-05-24 SURGICAL SUPPLY — 32 items
BARRIER ADHS 3X4 INTERCEED (GAUZE/BANDAGES/DRESSINGS) ×2 IMPLANT
BENZOIN TINCTURE PRP APPL 2/3 (GAUZE/BANDAGES/DRESSINGS) ×2 IMPLANT
CLAMP CORD UMBIL (MISCELLANEOUS) IMPLANT
CLOTH BEACON ORANGE TIMEOUT ST (SAFETY) ×2 IMPLANT
CONTAINER PREFILL 10% NBF 15ML (MISCELLANEOUS) IMPLANT
DRAPE SHEET LG 3/4 BI-LAMINATE (DRAPES) IMPLANT
DRSG OPSITE POSTOP 4X10 (GAUZE/BANDAGES/DRESSINGS) ×2 IMPLANT
DURAPREP 26ML APPLICATOR (WOUND CARE) ×2 IMPLANT
ELECT REM PT RETURN 9FT ADLT (ELECTROSURGICAL) ×2
ELECTRODE REM PT RTRN 9FT ADLT (ELECTROSURGICAL) ×1 IMPLANT
EXTRACTOR VACUUM M CUP 4 TUBE (SUCTIONS) IMPLANT
GLOVE BIO SURGEON STRL SZ7.5 (GLOVE) ×2 IMPLANT
GLOVE BIOGEL PI IND STRL 7.5 (GLOVE) ×1 IMPLANT
GLOVE BIOGEL PI INDICATOR 7.5 (GLOVE) ×1
GOWN STRL REUS W/TWL LRG LVL3 (GOWN DISPOSABLE) ×4 IMPLANT
KIT ABG SYR 3ML LUER SLIP (SYRINGE) IMPLANT
NEEDLE HYPO 25X5/8 SAFETYGLIDE (NEEDLE) IMPLANT
NS IRRIG 1000ML POUR BTL (IV SOLUTION) ×2 IMPLANT
PACK C SECTION WH (CUSTOM PROCEDURE TRAY) ×2 IMPLANT
PAD OB MATERNITY 4.3X12.25 (PERSONAL CARE ITEMS) ×2 IMPLANT
PENCIL SMOKE EVAC W/HOLSTER (ELECTROSURGICAL) ×2 IMPLANT
RTRCTR C-SECT PINK 25CM LRG (MISCELLANEOUS) ×2 IMPLANT
STRIP CLOSURE SKIN 1/2X4 (GAUZE/BANDAGES/DRESSINGS) ×2 IMPLANT
SUT CHROMIC 2 0 CT 1 (SUTURE) ×2 IMPLANT
SUT MNCRL AB 3-0 PS2 27 (SUTURE) ×2 IMPLANT
SUT PLAIN 0 NONE (SUTURE) IMPLANT
SUT PLAIN 2 0 XLH (SUTURE) ×2 IMPLANT
SUT VIC AB 0 CT1 36 (SUTURE) ×2 IMPLANT
SUT VIC AB 0 CTX 36 (SUTURE) ×3
SUT VIC AB 0 CTX36XBRD ANBCTRL (SUTURE) ×3 IMPLANT
TOWEL OR 17X24 6PK STRL BLUE (TOWEL DISPOSABLE) ×2 IMPLANT
TRAY FOLEY CATH SILVER 14FR (SET/KITS/TRAYS/PACK) ×2 IMPLANT

## 2015-05-24 NOTE — Progress Notes (Signed)
Megan Baxter MRN: [id number]  Subjective: -0145: Nurse call reports MVU's >250 and infant with variables.  Strip and chart reviewed.  Instructed to decrease pitocin to 58mUn/min. -0210: In room to assess.  Patient reports fetal shift from right to left side.  Coping well with contractions, but requests IV pain medication.  Continues to deny need for epidural.   Objective: BP 140/76 mmHg  Pulse 86  Temp(Src) 97.6 F (36.4 C) (Oral)  Resp 18  Ht 5\' 5"  (1.651 m)  Wt 74.844 kg (165 lb)  BMI 27.46 kg/m2  LMP 08/10/2014     FHT: 135 bpm, Mod Var, + Small Variable Decels, +10x10 Accels UC: Q72min, palpates strong, MVUs 185   SVE:   Dilation: 4.5 Effacement (%): 50 Station: -2 Exam by:: j. Tan Clopper  Cephalic palpated Membranes: AROM x 12hrs Pitocin:44mUn/min  Assessment:  IUP at 41wks Cat II FT  IOL Language Barrier CHTN  Plan: -Encouraged to relax and attempt to rest -Patient visually frustrated at discussion of VE -Informed will reassess at [place] IV pain medication as requested -Continue other mgmt as ordered  Eshawn Coor LYNN,MSN, CNM 05/24/2015, 2:33 AM

## 2015-05-24 NOTE — Transfer of Care (Signed)
Immediate Anesthesia Transfer of Care Note  Patient: Megan Baxter  Procedure(s) Performed: Procedure(s): CESAREAN SECTION (N/A)  Patient Location: PACU  Anesthesia Type:Epidural  Level of Consciousness: awake  Airway & Oxygen Therapy: Patient Spontanous Breathing  Post-op Assessment: Report given to RN  Post vital signs: Reviewed and stable  Last Vitals:  Filed Vitals:   05/24/15 1800  BP: 134/83  Pulse: 101  Temp:   Resp: 18    Complications: No apparent anesthesia complications

## 2015-05-24 NOTE — Progress Notes (Signed)
Megan Baxter MRN: [id number]  Subjective: -5883: Nurse call patient s/p prolonged deceleration and pitocin discontinued.  FSE applied and infant without variables.   -0424: Dr. Chauncey Cruel. Rivard consulted and updated on patient status.  Requests to speak with patient regarding POC. In room to conference via phone. Patient agrees to epidural after explanation of risks, benefits, and POC discussed, in [place], with Dr. Chauncey Cruel. Rivard.  Conversation lasted ~14minutes.    Objective: BP 158/89 mmHg  Pulse 83  Temp(Src) 97.6 F (36.4 C) (Oral)  Resp 18  Ht 5\' 5"  (1.651 m)  Wt 74.844 kg (165 lb)  BMI 27.46 kg/m2  LMP 08/10/2014     FHT: 130 bpm, Mod Var, -Decels, +Accels UC: Q6-44min, palpates mild, MVUs 66mmHg   SVE:   Dilation: 5 Effacement (%): 50 Station: -2 Exam by:: Megan Deal, RN Membranes:AROM x 14hrs Pitocin: D/c'd   Assessment:  IUP at 41wks Cat I FT  IOL Language Barrier CHTN  Plan: -Per Dr. Chauncey Cruel. Rivard, patient informed of need for frequent position change to promote fetal rotation and descent -After discussion, with Dr. Chauncey Cruel. Rivard, patient agreeable to epidural as next step in allowing comfort during induction process -Questions and concerns addressed -Dr. Chauncey Cruel. Rivard en route to act as interpreter during epidural placement -Anesthesia contacted  -Patient prepped for epidural -Will perform vaginal exam and restart pitocin after epidural placement -Continue other mgmt as ordered  [facility], Megan Gleghorn LYNN,MSN, CNM 05/24/2015, 5:07 AM

## 2015-05-24 NOTE — Progress Notes (Signed)
Called by CNM for episode of late decelerations and failure to make cervical change FHR returned to reassuring after Pitocin was discontinued Prior to discontinuation, MVU 200-290 Currently spacing of contractions to every 8 minutes  Reviewed with patient all findings and recommended to proceed with epidural to allow muscle relaxation and rotation of baby. Discussed the procedure with R&B. Questions answered. Patient called her husband to discuss with him: he was informed of the R&B. Patient was agreeable to proceed.  Translated for Dr Jillyn Hidden. Epidural placed without difficulty. FHR remained Category 1  VE: 5/70/-2 LOA with anterior asynclitism  Repositioned patient in right exagerated SIMMS. Now resting. Will restart Pitocin and monitor closely.

## 2015-05-24 NOTE — Progress Notes (Signed)
Subjective: Pt states she is very hungry and tired. Inquires about baby and current plan of care.     Objective: BP 132/84 mmHg  Pulse 103  Temp(Src) 99.6 F (37.6 C) (Oral)  Resp 18  Ht 5\' 5"  (1.651 m)  Wt 74.844 kg (165 lb)  BMI 27.46 kg/m2  SpO2 99%  LMP 08/10/2014   Total I/O In: -  Out: 2000 [Urine:2000]  FHT:   Category 2, 140 bpm- occasional late decelerations UC:   regular, every 4-5 minutes SVE:   Dilation: 7 Effacement (%): 80 Station: -1 Exam by:: Leotis Isham, CNM Pitocin at 6 milli-units per minute  MVUs 110    Assessment:  IUP 41w  Induction AROM x28 hrs Inadequate contractions No cervical change over 5+ hrs  Failure to progress   Plan: Recommendation for Cesarean Section Lengthy discussion with patient via translator phone regarding current plan of care, followed up with face to face discussion with husband regarding recommendation to proceed with cesarean section Discussed risk and benefits of cesarean section  - Pt and husband understand and are agreeable to proceed  Dr. Vista Deck CNM, [place] 05/24/2015, 6:02 PM

## 2015-05-24 NOTE — Anesthesia Postprocedure Evaluation (Signed)
  Anesthesia Post-op Note  Patient: Megan Baxter  Procedure(s) Performed: Procedure(s) (LRB): CESAREAN SECTION (N/A)  Patient Location: PACU  Anesthesia Type: Epidural  Level of Consciousness: awake and alert   Airway and Oxygen Therapy: Patient Spontanous Breathing  Post-op Pain: mild  Post-op Assessment: Post-op Vital signs reviewed, Patient's Cardiovascular Status Stable, Respiratory Function Stable, Patent Airway and No signs of Nausea or vomiting  Last Vitals:  Filed Vitals:   05/24/15 2130  BP: 148/93  Pulse: 83  Temp: 36.9 C  Resp: 19    Post-op Vital Signs: stable   Complications: No apparent anesthesia complications

## 2015-05-24 NOTE — Progress Notes (Signed)
Translation services used [facility]) to interpret PACU procedures and POC.

## 2015-05-24 NOTE — Progress Notes (Signed)
Subjective: Comfortable with epidural.  Appears to be itching stomach and arms.  Translator phone used to answer any question and review plan of care.    Objective: BP 135/77 mmHg  Pulse 81  Temp(Src) 98.3 F (36.8 C) (Oral)  Resp 17  Ht 5\' 5"  (1.651 m)  Wt 74.844 kg (165 lb)  BMI 27.46 kg/m2  SpO2 99%  LMP 08/10/2014     FHT:  Category 1 , 130 bpm, +Accels UC:   regular, every 4-5 minutes SVE:   Dilation: 6 Effacement (%): 80 Station: -2 Exam by:: Merrit Waugh, CNM Pitocin at 6 milli-units per minute MVUs  120   Assessment:  Pitcocin Augmentation of labor Inadequate contractions Itching   Plan: Continue current plan of care Discussed plan of care with pt via translator phone - pt agrees with plan Benadryl PRN  Dr. Mancel Bale Aware and available  [place], [place] 05/24/2015, 9:44 AM

## 2015-05-24 NOTE — Anesthesia Preprocedure Evaluation (Addendum)
Anesthesia Evaluation  Patient identified by MRN, date of birth, ID band Patient awake    Reviewed: Allergy & Precautions, H&P , NPO status , Patient's Chart, lab work & pertinent test results  Airway Mallampati: I  TM Distance: >3 FB Neck ROM: full    Dental no notable dental hx.    Pulmonary neg pulmonary ROS,    Pulmonary exam normal        Cardiovascular hypertension, Pt. on home beta blockers Normal cardiovascular exam Rhythm:Regular Rate:Normal     Neuro/Psych negative neurological ROS  negative psych ROS   GI/Hepatic negative GI ROS, Neg liver ROS,   Endo/Other  negative endocrine ROS  Renal/GU negative Renal ROS     Musculoskeletal   Abdominal Normal abdominal exam  (+)   Peds  Hematology   Anesthesia Other Findings   Reproductive/Obstetrics (+) Pregnancy                            Anesthesia Physical Anesthesia Plan  ASA: II and emergent  Anesthesia Plan: Epidural   Post-op Pain Management:    Induction:   Airway Management Planned: Natural Airway  Additional Equipment:   Intra-op Plan:   Post-operative Plan:   Informed Consent: I have reviewed the patients History and Physical, chart, labs and discussed the procedure including the risks, benefits and alternatives for the proposed anesthesia with the patient or authorized representative who has indicated his/her understanding and acceptance.     Plan Discussed with: CRNA, Anesthesiologist and Surgeon  Anesthesia Plan Comments: (Patient for C/section for arrest of dilation and failure to progress. Will use epidural for C/Section. M. Calirose Mccance,MD)       Anesthesia Quick Evaluation

## 2015-05-24 NOTE — Anesthesia Procedure Notes (Signed)
Epidural Patient location during procedure: OB Start time: 05/24/2015 5:18 AM End time: 05/24/2015 5:22 AM  Staffing Anesthesiologist: Lyn Hollingshead Performed by: anesthesiologist   Preanesthetic Checklist Completed: patient identified, surgical consent, pre-op evaluation, timeout performed, IV checked, risks and benefits discussed and monitors and equipment checked  Epidural Patient position: sitting Prep: site prepped and draped and DuraPrep Patient monitoring: continuous pulse ox and blood pressure Approach: midline Location: L3-L4 Injection technique: LOR air  Needle:  Needle type: Tuohy  Needle gauge: 17 G Needle length: 9 cm and 9 Needle insertion depth: 5 cm cm Catheter type: closed end flexible Catheter size: 19 Gauge Catheter at skin depth: 10 cm Test dose: negative and Other  Assessment Sensory level: T9 Events: blood not aspirated, injection not painful, no injection resistance, negative IV test and no paresthesia  Additional Notes Reason for block:procedure for pain

## 2015-05-24 NOTE — Op Note (Signed)
Cesarean Section Procedure Note  Indications: P3 at 41wks undergoing induction with FTP agreeing to proceed with c-section.  An interpreter was used to discuss circumcision.  They would like a circumcision here in the hospital.  Interpreter [phone]  Pre-operative Diagnosis:1.41 1/7wks 2.FAILURE TO PROGRESS 3.HISTORY OF PRIOR CESAREAN SECTION   Post-operative Diagnosis: 1.41 1/7wks 2.FAILURE TO PROGRESS 3.HISTORY OF PRIOR CESAREAN SECTION  Procedure: REPEAT CESAREAN SECTION  Surgeon: Everett Graff, MD    Assistants: Collier Bullock, CNM  Anesthesia: Regional  Anesthesiologist: Josephine Igo, MD   Procedure Details  The patient was taken to the operating room secondary to FTP after the risks, benefits, complications, treatment options, and expected outcomes were discussed with the patient.  The patient concurred with the proposed plan, giving informed consent which was signed and witnessed. The patient was taken to Operating Room One, identified as Megan Baxter and the procedure verified as C-Section Delivery. A Time Out was held and the above information confirmed.  After induction of anesthesia by obtaining a spinal, the patient was prepped and draped in the usual sterile manner. A Pfannenstiel skin incision was made and carried down through the subcutaneous tissue to the underlying layer of fascia.  The fascia was incised bilaterally and extended transversely bilaterally with the Mayo scissors. Kocher clamps were placed on the inferior aspect of the fascial incision and the underlying rectus muscle was separated from the fascia. The same was done on the superior aspect of the fascial incision.  The peritoneum was identified, entered bluntly and extended manually.  An Alexis self-retaining retractor was placed.  The utero-vesical peritoneal reflection was incised transversely and the bladder flap was bluntly freed from the lower uterine segment. A low transverse uterine incision was made with  the scalpel and extended bilaterally with the bandage scissors.  The infant was delivered in vertex position without difficulty.  After the umbilical cord was clamped and cut, the infant was handed to the awaiting pediatricians.  Cord blood was obtained for evaluation.  The placenta was removed intact and appeared to be within normal limits. The uterus was cleared of all clots and debris. The uterine incision was closed with running interlocking sutures of 0 Vicryl and a second imbricating layer was performed as well.   Bilateral tubes and ovaries appeared to be within normal limits.  Good hemostasis was noted.  Copious irrigation was performed until clear.  The peritoneum was repaired with 2-0 chromic via a running suture.  The fascia was reapproximated with a running suture of 0 Vicryl. The subcutaneous tissue was reapproximated with 3 interrupted sutures of 2-0 plain.  The skin was reapproximated with a subcuticular suture of 3-0 monocryl.  Steristrips were applied.  Instrument, sponge, and needle counts were correct prior to abdominal closure and at the conclusion of the case.  The patient was awaiting transfer to the recovery room in good condition.  Findings: Live female infant with Apgars 8 at one minute and 9 at five minutes.  Normal appearing bilateral ovaries and fallopian tubes were noted.  Estimated Blood Loss:  800 ml         Drains: foley to gravity 55 cc         Total IV Fluids: 2600 ml         Specimens to Pathology: Placenta         Complications:  None; patient tolerated the procedure well.         Disposition: PACU - hemodynamically stable.  Condition: stable  Attending Attestation: I performed the procedure.

## 2015-05-24 NOTE — Progress Notes (Addendum)
Subjective: Comfortable with epidural. Spoke with patient via translator phone regarding fetal tolerance of labor.    Objective: BP 142/94 mmHg  Pulse 93  Temp(Src) 98.6 F (37 C) (Oral)  Resp 18  Ht 5\' 5"  (1.651 m)  Wt 74.844 kg (165 lb)  BMI 27.46 kg/m2  SpO2 99%  LMP 08/10/2014      FHT: Category 2, late decelerations, no accels, minimal vaiability UC:   regular, every 3-5 minutes SVE:   Dilation: 7 Effacement (%): 80 Station: -1 Exam by:: Riva Sesma, CNM Pitocin at 9 milli-units MVUs  180-200 Membranes: AROM x 25 hrs - Afebrile GBS negative   Assessment:  Post dates Augmentation AROM x 25 hrs Adequate contractions Cat 2 tracing, late decelerations  No cervical change  Plan: Amnioinfusion-  150 ml bolus, 150 ml/hr infusion Oxygen, Position change  Pitocin reduced by half to 4 milli-units per minute -Discussed amnioinfusion  And oxygen with patient -Discussed fetal distress ( late decelerations) with patient and care options if fetal distress continues -Discussed options for delivery and  the possibility of a need for cesarean section- Pt refuses to consider the possibility of cesarean section, states via translator  " No Cesarean Section. Period" and  "we are attempting labor, let's continue laboring"  Will continue to closely monitor contractions adequacy, cervical change, and fetal tolerance of labor Dr. Mancel Bale consulted, aware of plan, and agrees.    Addendum  (1600) :   Pt requested that I speak with her English speaking husband regarding plan of care.   Discussed current fetal intolerance of pitocin x2 episodes once pitocin levels reached 8-9 milliunits in the presence of adequate contractions and minimal cervical change. Husband understands and will return to the hospital within the hour.  Additional plan:   Next VE to be preformed when husband is present.   [place], [place] 05/24/2015, 2:52 PM

## 2015-05-24 NOTE — Progress Notes (Signed)
Subjective: Comfortable with epidural.  Husband at bedside with their young daughter.   Objective: BP 152/70 mmHg  Pulse 90  Temp(Src) 98.6 F (37 C) (Oral)  Resp 18  Ht 5\' 5"  (1.651 m)  Wt 74.844 kg (165 lb)  BMI 27.46 kg/m2  SpO2 99%  LMP 08/10/2014      FHT: Category 2, 130 bpm, Early decelerations, moderate variability,  UC:   regular, every 3-5 minutes SVE:   Dilation: 7 Effacement (%): 80 Station: -1 Exam by:: Chika Cichowski, CNM  Membranes: AROM Pitocin:  9 milli-units per minute MVUs 210  Assessment:  IUP at 41w Augmentation of labor AROM x 22 hrs Adequate contractions Cat 2 tracing with early decelerations, moderate variability   Plan: Continue current plan of care   [place], [place] 05/24/2015, 12:25 PM

## 2015-05-25 LAB — CBC
HCT: 26.4 % — ABNORMAL LOW (ref 36.0–46.0)
HEMOGLOBIN: 8.8 g/dL — AB (ref 12.0–15.0)
MCH: 27.4 pg (ref 26.0–34.0)
MCHC: 33.3 g/dL (ref 30.0–36.0)
MCV: 82.2 fL (ref 78.0–100.0)
Platelets: 286 10*3/uL (ref 150–400)
RBC: 3.21 MIL/uL — AB (ref 3.87–5.11)
RDW: 15 % (ref 11.5–15.5)
WBC: 11.1 10*3/uL — AB (ref 4.0–10.5)

## 2015-05-25 NOTE — Lactation Note (Signed)
This note was copied from the chart of Megan Docie Brinley. Lactation Consultation Note Initial visit at 23 hours of age. MBU RN reports difficulty with language line interpreter today.  Mom called FOB by phone and used as interpreter on speaker phone.  FOB reports to ask for [place], [place] interpreter if needed.  Mom holding baby asleep STS and reports a recent feeding of about 10 minutes.   Mom has older children that breast fed and denies concerns.  [facility] LC resources given and discussed.  Encouraged to feed with early cues on demand.  Hand expression demonstrated with colostrum visible.  Mom to call for assist as needed.     Patient Name: Megan Baxter [id number] Date: 05/25/2015 Reason for consult: Initial assessment   Maternal Data Has patient been taught Hand Expression?: Yes Does the patient have breastfeeding experience prior to this delivery?: Yes  Feeding Feeding Type: Breast Fed Length of feed: 10 min  LATCH Score/Interventions                      Lactation Tools Discussed/Used     Consult Status Consult Status: Follow-up Date: 05/26/15 Follow-up type: In-patient    Megan Baxter, Justine Null 05/25/2015, 6:16 PM

## 2015-05-25 NOTE — Progress Notes (Signed)
Subjective: Postpartum Day 1: Cesarean Delivery due to repeat and failed VBAC Patient up ad lib, reports no syncope or dizziness. Feeding:  breast Contraceptive plan:  unsure  Objective: Vital signs in last 24 hours: Temp:  [97.7 F (36.5 C)-99.6 F (37.6 C)] 97.9 F (36.6 C) (10/29 0857) Pulse Rate:  [75-103] 90 (10/29 0903) Resp:  [14-24] 20 (10/29 0505) BP: (84-156)/(41-97) 104/56 mmHg (10/29 0903) SpO2:  [98 %-100 %] 98 % (10/29 0857)  Physical Exam:  General: alert and cooperative Lochia: appropriate Uterine Fundus: firm Abdomen:  + bowel sounds, non distended Incision: no significant drainage  Pressure dressing CDI DVT Evaluation: No evidence of DVT seen on physical exam. Homan's sign: Negative   Recent Labs  05/23/15 0900 05/24/15 2114 05/25/15 0540  HGB 10.0* 9.2* 8.8*  HCT 30.1* 27.9* 26.4*  WBC 6.1 13.9* 11.1*   Orthostatics stable.  Assessment: Status post Cesarean section day 1. Doing well postoperatively.  Honeycomb dressing in place, no significant drainage Anemia - hemodynamicly stable.   Circumcision: in patient SCD in place   Plan: Continue current care. Breastfeeding and Lactation consult Remove outer dressing tomorrow   Trinita Devlin, CNM, MSN 05/25/2015. 10:03 AM

## 2015-05-25 NOTE — Progress Notes (Addendum)
Spoke with patient via Astronomer. Interpreter stated that he did not feel that the patient and he were not speaking 100% same language [place]). Pt indicated she wanted the FOB to interpret. Via the interpreter, RN asked the patient if she wanted to sign to consent for FOB to interpret.  PT stated she wanted to have FOB interpret while in the room and the interpreter line while he was away.  FOB in room at 10:30.He stated that she wouldn't understand "[place]" which is what is spoken on the interpreter line; she speaks "[place]".   Pt appeared relaxed and readily communicated with FOB when asking RN questions.

## 2015-05-25 NOTE — Addendum Note (Signed)
Addendum  created 05/25/15 0744 by Riki Sheer, CRNA   Modules edited: Notes Section   Notes Section:  File: [id number]

## 2015-05-25 NOTE — Anesthesia Postprocedure Evaluation (Signed)
  Anesthesia Post-op Note  Patient: Megan Baxter  Procedure(s) Performed: Procedure(s): CESAREAN SECTION (N/A)  Patient Location: PACU  Anesthesia Type:Epidural  Level of Consciousness: awake, alert  and oriented  Airway and Oxygen Therapy: Patient Spontanous Breathing  Post-op Pain: none  Post-op Assessment: Post-op Vital signs reviewed, Patient's Cardiovascular Status Stable, Respiratory Function Stable, Patent Airway, No signs of Nausea or vomiting, Adequate PO intake, Pain level controlled, No headache, No backache and Patient able to bend at knees LLE Motor Response: Purposeful movement LLE Sensation: Tingling RLE Motor Response: Purposeful movement RLE Sensation: Tingling      Post-op Vital Signs: Reviewed and stable  Last Vitals:  Filed Vitals:   05/25/15 0516  BP: 84/41  Pulse: 92  Temp:   Resp:     Complications: No apparent anesthesia complications

## 2015-05-26 MED ORDER — FERROUS SULFATE 325 (65 FE) MG PO TABS
325.0000 mg | ORAL_TABLET | Freq: Two times a day (BID) | ORAL | Status: DC
  Administered 2015-05-26 – 2015-05-27 (×2): 325 mg via ORAL
  Filled 2015-05-26 (×2): qty 1

## 2015-05-26 NOTE — Progress Notes (Addendum)
Subjective: Postpartum Day 2: Cesarean Delivery due to failed VBAC Patient up ad lib, reports no syncope or dizziness. Feeding:  breast Contraceptive plan:  unsure  Objective: Vital signs in last 24 hours: Temp:  [97.6 F (36.4 C)-98.6 F (37 C)] 98.6 F (37 C) (10/30 0545) Pulse Rate:  [79-97] 88 (10/30 0545) Resp:  [16-20] 18 (10/30 0545) BP: (118-144)/(69-86) 118/69 mmHg (10/30 0545) SpO2:  [97 %] 97 % (10/29 1549)  Physical Exam:  General: alert and cooperative Lochia: appropriate Uterine Fundus: firm Abdomen:  + bowel sounds, non distended Incision: healing well, no significant drainage  Honeycomb dressing CDI DVT Evaluation: No evidence of DVT seen on physical exam. Homan's sign: Negative   Recent Labs  05/24/15 2114 05/25/15 0540  HGB 9.2* 8.8*  HCT 27.9* 26.4*  WBC 13.9* 11.1*   Orthostatics stable.  Assessment: Status post Cesarean section day 2. Doing well postoperatively.  Honeycomb dressing in place, no significant drainage Anemia - hemodynamicly stable.   Circumcision: in patient   Plan: Continue current care. Plan for discharge tomorrow and Breastfeeding    Venus Standard, CNM, MSN 05/26/2015. 11:56 AM  I saw and examined patient at bedside and agree with above findings, assessment and plan.  Incision with small old blood, marked with pen, follow up on subsequent exam.  Dr. Alesia Richards.

## 2015-05-26 NOTE — Discharge Summary (Signed)
[facility] Discharge Summary Discharge instruction given via translator # [phone]  Patient Name: Megan Baxter DOB: November 21, 1970 MRN: [id number]  Date of admission: 05/23/2015 Delivering MD: Everett Graff   Date of discharge: 05/26/2015  Admitting diagnosis: INDUCTION Intrauterine pregnancy: [redacted]w[redacted]d     Secondary diagnosis:Principal Problem:   Chronic hypertension in pregnancy Active Problems:   Pregnancy   Advanced maternal age in multigravida   Insufficient prenatal care - late entry to care at 33 wks   Post-dates pregnancy   Anemia   Polyhydramnios  Additional problems:none     Discharge diagnosis: CHTN, Anemia and post date delivery                                                                     Post partum procedures:n/a  Augmentation: AROM and Pitocin  Complications: None  Hospital course:  Induction of Labor With Cesarean Section  44 y.o. yo [phone] at [redacted]w[redacted]d was admitted to the hospital 05/23/2015 for induction of labor. Patient had a labor course significant for TOLAC. The patient went for cesarean section due to Arrest of Descent, and delivered a Viable infant,@BABYSUPPRESS (DBLINK,ept,110,,1,,) Membrane Rupture Time/Date: )2:55 PM ,05/23/2015   @Details  of operation can be found in separate operative Note.  Patient had an uncomplicated postpartum course. She is ambulating, tolerating a regular diet, passing flatus, and urinating well.  Patient is discharged home in stable condition on No discharge date for patient encounter.Marland Kitchen                                     Physical exam  Filed Vitals:   05/25/15 1225 05/25/15 1549 05/25/15 2051 05/26/15 0545  BP: 124/70 126/74 144/86 118/69  Pulse: 79 89 97 88  Temp: 97.6 F (36.4 C) 98.2 F (36.8 C)  98.6 F (37 C)  TempSrc: Oral Oral  Oral  Resp: 18 16 20 18   Height:      Weight:      SpO2:  97%     General: alert and cooperative Lochia: appropriate Uterine Fundus: firm Incision: Healing well with no significant  drainage DVT Evaluation: No evidence of DVT seen on physical exam. Labs: Lab Results  Component Value Date   WBC 11.1* 05/25/2015   HGB 8.8* 05/25/2015   HCT 26.4* 05/25/2015   MCV 82.2 05/25/2015   PLT 286 05/25/2015   CMP Latest Ref Rng 05/23/2015  Glucose 65 - 99 mg/dL 80  BUN 6 - 20 mg/dL 11  Creatinine 0.44 - 1.00 mg/dL 0.48  Sodium 135 - 145 mmol/L 135  Potassium 3.5 - 5.1 mmol/L 3.6  Chloride 101 - 111 mmol/L 107  CO2 22 - 32 mmol/L 22  Calcium 8.9 - 10.3 mg/dL 9.4  Total Protein 6.5 - 8.1 g/dL 7.2  Total Bilirubin 0.3 - 1.2 mg/dL 0.9  Alkaline Phos 38 - 126 U/L 143(H)  AST 15 - 41 U/L 17  ALT 14 - 54 U/L 13(L)    Discharge instruction: per After Visit Summary and "Baby and Me Booklet".  Medications:  Current facility-administered medications:  .  acetaminophen (TYLENOL) tablet 650 mg, 650 mg, Oral, Q4H PRN, Everett Graff, MD .  witch hazel-glycerin (TUCKS) pad 1 application,  1 application, Topical, PRN **AND** dibucaine (NUPERCAINAL) 1 % rectal ointment 1 application, 1 application, Rectal, PRN, Everett Graff, MD .  [DISCONTINUED] diphenhydrAMINE (BENADRYL) injection 12.5 mg, 12.5 mg, Intravenous, Q4H PRN **OR** diphenhydrAMINE (BENADRYL) capsule 25 mg, 25 mg, Oral, Q4H PRN, Josephine Igo, MD .  diphenhydrAMINE (BENADRYL) capsule 25 mg, 25 mg, Oral, Q6H PRN, Everett Graff, MD .  ibuprofen (ADVIL,MOTRIN) tablet 600 mg, 600 mg, Oral, Q6H PRN, Josephine Igo, MD .  ibuprofen (ADVIL,MOTRIN) tablet 600 mg, 600 mg, Oral, 4 times per day, Everett Graff, MD, 600 mg at 05/26/15 1107 .  labetalol (NORMODYNE) tablet 300 mg, 300 mg, Oral, BID, Everett Graff, MD, 300 mg at 05/26/15 1026 .  labetalol (NORMODYNE,TRANDATE) injection 20-80 mg, 20-80 mg, Intravenous, Q10 min PRN, Gavin Pound, CNM .  lactated ringers infusion, , Intravenous, Continuous, Everett Graff, MD, Last Rate: 125 mL/hr at 05/25/15 0948, 1 mL at 05/25/15 0948 .  lanolin ointment 1 application, 1  application, Topical, PRN, Everett Graff, MD .  menthol-cetylpyridinium (CEPACOL) lozenge 3 mg, 1 lozenge, Oral, Q2H PRN, Everett Graff, MD .  nalbuphine (NUBAIN) injection 5 mg, 5 mg, Intravenous, Q4H PRN, 5 mg at 05/25/15 0534 **OR** nalbuphine (NUBAIN) injection 5 mg, 5 mg, Subcutaneous, Q4H PRN, Josephine Igo, MD .  nalbuphine (NUBAIN) injection 5 mg, 5 mg, Intravenous, Once PRN **OR** nalbuphine (NUBAIN) injection 5 mg, 5 mg, Subcutaneous, Once PRN, Josephine Igo, MD .  naloxone [facility]) 2 mg in dextrose 5 % 250 mL infusion, 1-4 mcg/kg/hr, Intravenous, Continuous PRN, Josephine Igo, MD .  naloxone [facility]) injection 0.4 mg, 0.4 mg, Intravenous, PRN **AND** sodium chloride 0.9 % injection 3 mL, 3 mL, Intravenous, PRN, Josephine Igo, MD .  ondansetron [facility]) injection 4 mg, 4 mg, Intravenous, Q8H PRN, Josephine Igo, MD .  oxyCODONE-acetaminophen (PERCOCET/ROXICET) 5-325 MG per tablet 1 tablet, 1 tablet, Oral, Q4H PRN, Everett Graff, MD, 1 tablet at 05/25/15 2052 .  oxyCODONE-acetaminophen (PERCOCET/ROXICET) 5-325 MG per tablet 2 tablet, 2 tablet, Oral, Q4H PRN, Everett Graff, MD .  prenatal multivitamin tablet 1 tablet, 1 tablet, Oral, Q1200, Everett Graff, MD, 1 tablet at 05/26/15 1108 .  scopolamine (TRANSDERM-SCOP) 1 MG/3DAYS 1.5 mg, 1 patch, Transdermal, Once, Josephine Igo, MD .  senna-docusate (Senokot-S) tablet 2 tablet, 2 tablet, Oral, Q24H, Everett Graff, MD, 2 tablet at 05/25/15 2347 .  simethicone (MYLICON) chewable tablet 80 mg, 80 mg, Oral, TID PC, Everett Graff, MD, 80 mg at 05/26/15 1108 .  simethicone (MYLICON) chewable tablet 80 mg, 80 mg, Oral, Q24H, Everett Graff, MD, 80 mg at 05/25/15 2347 .  simethicone (MYLICON) chewable tablet 80 mg, 80 mg, Oral, PRN, Everett Graff, MD .  Tdap (BOOSTRIX) injection 0.5 mL, 0.5 mL, Intramuscular, Once, Everett Graff, MD .  zolpidem (AMBIEN) tablet 5 mg, 5 mg, Oral, QHS PRN, Everett Graff, MD After Visit Meds:      Medication List    ASK your doctor about these medications        labetalol 300 MG tablet  Commonly known as:  NORMODYNE  Take 1 tablet (300 mg total) by mouth 2 (two) times daily.        Diet: routine diet  Activity: Advance as tolerated. Pelvic rest for 6 weeks.   Outpatient follow up:6 weeks Follow up Appt:No future appointments. Follow up visit: No Follow-up on file.  Postpartum contraception: Undecided  Newborn Data: Live born female  Birth Weight: 7 lb (3175 g) APGAR: 8, 9  Baby Feeding: Breast Disposition:home with mother  Smart start nurse to visit on Thursday for BP check   05/26/2015 Earley Grobe, CNM   All information was confirmed prior to discharge  Care After Cesarean Delivery  Refer to this sheet in the next few weeks. These instructions provide you with information on caring for yourself after your procedure. Your caregiver may also give you specific instructions. Your treatment has been planned according to current medical practices, but problems sometimes occur. Call your caregiver if you have any problems or questions after you go home. HOME CARE INSTRUCTIONS  Only take over-the-counter or prescription medicines as directed by your caregiver.  Do not drink alcohol, especially if you are breastfeeding or taking medicine to relieve pain.  Do not chew or smoke tobacco.  Continue to use good perineal care. Good perineal care includes:  Wiping your perineum from front to back.  Keeping your perineum clean.  Check your cut (incision) daily for increased redness, drainage, swelling, or separation of skin.  Clean your incision gently with soap and water every day, and then pat it dry. If your caregiver says it is okay, leave the incision uncovered. Use a bandage (dressing) if the incision is draining fluid or appears irritated. If the adhesive strips across the incision do not fall off within 7 days, carefully peel them off.  Hug a pillow  when coughing or sneezing until your incision is healed. This helps to relieve pain.  Do not use tampons or douche until your caregiver says it is okay.  Shower, wash your hair, and take tub baths as directed by your caregiver.  Wear a well-fitting bra that provides breast support.  Limit wearing support panties or control-top hose.  Drink enough fluids to keep your urine clear or pale yellow.  Eat high-fiber foods such as whole grain cereals and breads, brown rice, beans, and fresh fruits and vegetables every day. These foods may help prevent or relieve constipation.  Resume activities such as climbing stairs, driving, lifting, exercising, or traveling as directed by your caregiver.  Talk to your caregiver about resuming sexual activities. This is dependent upon your risk of infection, your rate of healing, and your comfort and desire to resume sexual activity.  Try to have someone help you with your household activities and your newborn for at least a few days after you leave the hospital.  Rest as much as possible. Try to rest or take a nap when your newborn is sleeping.  Increase your activities gradually.  Keep all of your scheduled postpartum appointments. It is very important to keep your scheduled follow-up appointments. At these appointments, your caregiver will be checking to make sure that you are healing physically and emotionally. SEEK MEDICAL CARE IF:   You are passing large clots from your vagina. Save any clots to show your caregiver.  You have a foul smelling discharge from your vagina.  You have trouble urinating.  You are urinating frequently.  You have pain when you urinate.  You have a change in your bowel movements.  You have increasing redness, pain, or swelling near your incision.  You have pus draining from your incision.  Your incision is separating.  You have painful, hard, or reddened breasts.  You have a severe headache.  You have blurred  vision or see spots.  You feel sad or depressed.  You have thoughts of hurting yourself or your newborn.  You have questions about your care, the care of your newborn, or medicines.  You are dizzy or lightheaded.  You have a rash.  You have pain, redness, or swelling at the site of the removed intravenous access (IV) tube.  You have nausea or vomiting.  You stopped breastfeeding and have not had a menstrual period within 12 weeks of stopping.  You are not breastfeeding and have not had a menstrual period within 12 weeks of delivery.  You have a fever. SEEK IMMEDIATE MEDICAL CARE IF:  You have persistent pain.  You have chest pain.  You have shortness of breath.  You faint.  You have leg pain.  You have stomach pain.  Your vaginal bleeding saturates 2 or more sanitary pads in 1 hour. MAKE SURE YOU:   Understand these instructions.  Will watch your condition.  Will get help right away if you are not doing well or get worse. Document Released: 04/04/2002 Document Revised: 04/06/2012 Document Reviewed: 03/09/2012 [facility] Patient Information 2014 [place].   Postpartum Depression and Baby Blues  The postpartum period begins right after the birth of a baby. During this time, there is often a great amount of joy and excitement. It is also a time of considerable changes in the life of the parent(s). Regardless of how many times a mother gives birth, each child brings new challenges and dynamics to the family. It is not unusual to have feelings of excitement accompanied by confusing shifts in moods, emotions, and thoughts. All mothers are at risk of developing postpartum depression or the "baby blues." These mood changes can occur right after giving birth, or they may occur many months after giving birth. The baby blues or postpartum depression can be mild or severe. Additionally, postpartum depression can resolve rather quickly, or it can be a long-term  condition. CAUSES Elevated hormones and their rapid decline are thought to be a main cause of postpartum depression and the baby blues. There are a number of hormones that radically change during and after pregnancy. Estrogen and progesterone usually decrease immediately after delivering your baby. The level of thyroid hormone and various cortisol steroids also rapidly drop. Other factors that play a major role in these changes include major life events and genetics.  RISK FACTORS If you have any of the following risks for the baby blues or postpartum depression, know what symptoms to watch out for during the postpartum period. Risk factors that may increase the likelihood of getting the baby blues or postpartum depression include: 1. Havinga personal or family history of depression. 2. Having depression while being pregnant. 3. Having premenstrual or oral contraceptive-associated mood issues. 4. Having exceptional life stress. 5. Having marital conflict. 6. Lacking a social support network. 7. Having a baby with special needs. 8. Having health problems such as diabetes. SYMPTOMS Baby blues symptoms include:  Brief fluctuations in mood, such as going from extreme happiness to sadness.  Decreased concentration.  Difficulty sleeping.  Crying spells, tearfulness.  Irritability.  Anxiety. Postpartum depression symptoms typically begin within the first month after giving birth. These symptoms include:  Difficulty sleeping or excessive sleepiness.  Marked weight loss.  Agitation.  Feelings of worthlessness.  Lack of interest in activity or food. Postpartum psychosis is a very concerning condition and can be dangerous. Fortunately, it is rare. Displaying any of the following symptoms is cause for immediate medical attention. Postpartum psychosis symptoms include:  Hallucinations and delusions.  Bizarre or disorganized behavior.  Confusion or disorientation. DIAGNOSIS  A  diagnosis is made by an evaluation of your symptoms. There are no medical or lab tests that lead  to a diagnosis, but there are various questionnaires that a caregiver may use to identify those with the baby blues, postpartum depression, or psychosis. Often times, a screening tool called the [place] Postnatal Depression Scale is used to diagnose depression in the postpartum period.  TREATMENT The baby blues usually goes away on its own in 1 to 2 weeks. Social support is often all that is needed. You should be encouraged to get adequate sleep and rest. Occasionally, you may be given medicines to help you sleep.  Postpartum depression requires treatment as it can last several months or longer if it is not treated. Treatment may include individual or group therapy, medicine, or both to address any social, physiological, and psychological factors that may play a role in the depression. Regular exercise, a healthy diet, rest, and social support may also be strongly recommended.  Postpartum psychosis is more serious and needs treatment right away. Hospitalization is often needed. HOME CARE INSTRUCTIONS  Get as much rest as you can. Nap when the baby sleeps.  Exercise regularly. Some women find yoga and walking to be beneficial.  Eat a balanced and nourishing diet.  Do little things that you enjoy. Have a cup of tea, take a bubble bath, read your favorite magazine, or listen to your favorite music.  Avoid alcohol.  Ask for help with household chores, cooking, grocery shopping, or running errands as needed. Do not try to do everything.  Talk to people close to you about how you are feeling. Get support from your partner, family members, friends, or other new moms.  Try to stay positive in how you think. Think about the things you are grateful for.  Do not spend a lot of time alone.  Only take medicine as directed by your caregiver.  Keep all your postpartum appointments.  Let your caregiver  know if you have any concerns. SEEK MEDICAL CARE IF: You are having a reaction or problems with your medicine. SEEK IMMEDIATE MEDICAL CARE IF:  You have suicidal feelings.  You feel you may harm the baby or someone else. Document Released: 04/16/2004 Document Revised: 10/05/2011 Document Reviewed: 05/19/2011 [facility] Patient Information 2014 [place], [place].   Breastfeeding Deciding to breastfeed is one of the best choices you can make for you and your baby. A change in hormones during pregnancy causes your breast tissue to grow and increases the number and size of your milk ducts. These hormones also allow proteins, sugars, and fats from your blood supply to make breast milk in your milk-producing glands. Hormones prevent breast milk from being released before your baby is born as well as prompt milk flow after birth. Once breastfeeding has begun, thoughts of your baby, as well as his or her sucking or crying, can stimulate the release of milk from your milk-producing glands.  BENEFITS OF BREASTFEEDING For Your Baby  Your first milk (colostrum) helps your baby's digestive system function better.   There are antibodies in your milk that help your baby fight off infections.   Your baby has a lower incidence of asthma, allergies, and sudden infant death syndrome.   The nutrients in breast milk are better for your baby than infant formulas and are designed uniquely for your baby's needs.   Breast milk improves your baby's brain development.   Your baby is less likely to develop other conditions, such as childhood obesity, asthma, or type 2 diabetes mellitus.  For You   Breastfeeding helps to create a very special bond between you and  your baby.   Breastfeeding is convenient. Breast milk is always available at the correct temperature and costs nothing.   Breastfeeding helps to burn calories and helps you lose the weight gained during pregnancy.   Breastfeeding makes your  uterus contract to its prepregnancy size faster and slows bleeding (lochia) after you give birth.   Breastfeeding helps to lower your risk of developing type 2 diabetes mellitus, osteoporosis, and breast or ovarian cancer later in life. SIGNS THAT YOUR BABY IS HUNGRY Early Signs of Hunger  Increased alertness or activity.  Stretching.  Movement of the head from side to side.  Movement of the head and opening of the mouth when the corner of the mouth or cheek is stroked (rooting).  Increased sucking sounds, smacking lips, cooing, sighing, or squeaking.  Hand-to-mouth movements.  Increased sucking of fingers or hands. Late Signs of Hunger  Fussing.  Intermittent crying. Extreme Signs of Hunger Signs of extreme hunger will require calming and consoling before your baby will be able to breastfeed successfully. Do not wait for the following signs of extreme hunger to occur before you initiate breastfeeding:   Restlessness.  A loud, strong cry.   Screaming.  BREASTFEEDING BASICS Breastfeeding Initiation  Find a comfortable place to sit or lie down, with your neck and back well supported.  Place a pillow or rolled up blanket under your baby to bring him or her to the level of your breast (if you are seated). Nursing pillows are specially designed to help support your arms and your baby while you breastfeed.  Make sure that your baby's abdomen is facing your abdomen.   Gently massage your breast. With your fingertips, massage from your chest wall toward your nipple in a circular motion. This encourages milk flow. You may need to continue this action during the feeding if your milk flows slowly.  Support your breast with 4 fingers underneath and your thumb above your nipple. Make sure your fingers are well away from your nipple and your baby's mouth.   Stroke your baby's lips gently with your finger or nipple.   When your baby's mouth is open wide enough, quickly bring  your baby to your breast, placing your entire nipple and as much of the colored area around your nipple (areola) as possible into your baby's mouth.   More areola should be visible above your baby's upper lip than below the lower lip.   Your baby's tongue should be between his or her lower gum and your breast.   Ensure that your baby's mouth is correctly positioned around your nipple (latched). Your baby's lips should create a seal on your breast and be turned out (everted).  It is common for your baby to suck about 2-3 minutes in order to start the flow of breast milk. Latching Teaching your baby how to latch on to your breast properly is very important. An improper latch can cause nipple pain and decreased milk supply for you and poor weight gain in your baby. Also, if your baby is not latched onto your nipple properly, he or she may swallow some air during feeding. This can make your baby fussy. Burping your baby when you switch breasts during the feeding can help to get rid of the air. However, teaching your baby to latch on properly is still the best way to prevent fussiness from swallowing air while breastfeeding. Signs that your baby has successfully latched on to your nipple:    Silent tugging or silent sucking,  without causing you pain.   Swallowing heard between every 3-4 sucks.    Muscle movement above and in front of his or her ears while sucking.  Signs that your baby has not successfully latched on to nipple:   Sucking sounds or smacking sounds from your baby while breastfeeding.  Nipple pain. If you think your baby has not latched on correctly, slip your finger into the corner of your baby's mouth to break the suction and place it between your baby's gums. Attempt breastfeeding initiation again. Signs of Successful Breastfeeding Signs from your baby:   A gradual decrease in the number of sucks or complete cessation of sucking.   Falling asleep.   Relaxation of  his or her body.   Retention of a small amount of milk in his or her mouth.   Letting go of your breast by himself or herself. Signs from you:  Breasts that have increased in firmness, weight, and size 1-3 hours after feeding.   Breasts that are softer immediately after breastfeeding.  Increased milk volume, as well as a change in milk consistency and color by the fifth day of breastfeeding.   Nipples that are not sore, cracked, or bleeding. Signs That Your Randel Books is Getting Enough Milk  Wetting at least 3 diapers in a 24-hour period. The urine should be clear and pale yellow by age 521 days.  At least 3 stools in a 24-hour period by age 521 days. The stool should be soft and yellow.  At least 3 stools in a 24-hour period by age 53 days. The stool should be seedy and yellow.  No loss of weight greater than 10% of birth weight during the first 20 days of age.  Average weight gain of 4-7 ounces (113-198 g) per week after age 31 days.  Consistent daily weight gain by age 103 days, without weight loss after the age of 2 weeks. After a feeding, your baby may spit up a small amount. This is common. BREASTFEEDING FREQUENCY AND DURATION Frequent feeding will help you make more milk and can prevent sore nipples and breast engorgement. Breastfeed when you feel the need to reduce the fullness of your breasts or when your baby shows signs of hunger. This is called "breastfeeding on demand." Avoid introducing a pacifier to your baby while you are working to establish breastfeeding (the first 4-6 weeks after your baby is born). After this time you may choose to use a pacifier. Research has shown that pacifier use during the first year of a baby's life decreases the risk of sudden infant death syndrome (SIDS). Allow your baby to feed on each breast as long as he or she wants. Breastfeed until your baby is finished feeding. When your baby unlatches or falls asleep while feeding from the first breast, offer  the second breast. Because newborns are often sleepy in the first few weeks of life, you may need to awaken your baby to get him or her to feed. Breastfeeding times will vary from baby to baby. However, the following rules can serve as a guide to help you ensure that your baby is properly fed:  Newborns (babies 74 weeks of age or younger) may breastfeed every 1-3 hours.  Newborns should not go longer than 3 hours during the day or 5 hours during the night without breastfeeding.  You should breastfeed your baby a minimum of 8 times in a 24-hour period until you begin to introduce solid foods to your baby at around 6  months of age. BREAST MILK PUMPING Pumping and storing breast milk allows you to ensure that your baby is exclusively fed your breast milk, even at times when you are unable to breastfeed. This is especially important if you are going back to work while you are still breastfeeding or when you are not able to be present during feedings. Your lactation consultant can give you guidelines on how long it is safe to store breast milk.  A breast pump is a machine that allows you to pump milk from your breast into a sterile bottle. The pumped breast milk can then be stored in a refrigerator or freezer. Some breast pumps are operated by hand, while others use electricity. Ask your lactation consultant which type will work best for you. Breast pumps can be purchased, but some hospitals and breastfeeding support groups lease breast pumps on a monthly basis. A lactation consultant can teach you how to hand express breast milk, if you prefer not to use a pump.  CARING FOR YOUR BREASTS WHILE YOU BREASTFEED Nipples can become dry, cracked, and sore while breastfeeding. The following recommendations can help keep your breasts moisturized and healthy:  Avoid using soap on your nipples.   Wear a supportive bra. Although not required, special nursing bras and tank tops are designed to allow access to your  breasts for breastfeeding without taking off your entire bra or top. Avoid wearing underwire-style bras or extremely tight bras.  Air dry your nipples for 3-11minutes after each feeding.   Use only cotton bra pads to absorb leaked breast milk. Leaking of breast milk between feedings is normal.   Use lanolin on your nipples after breastfeeding. Lanolin helps to maintain your skin's normal moisture barrier. If you use pure lanolin, you do not need to wash it off before feeding your baby again. Pure lanolin is not toxic to your baby. You may also hand express a few drops of breast milk and gently massage that milk into your nipples and allow the milk to air dry. In the first few weeks after giving birth, some women experience extremely full breasts (engorgement). Engorgement can make your breasts feel heavy, warm, and tender to the touch. Engorgement peaks within 3-5 days after you give birth. The following recommendations can help ease engorgement:  Completely empty your breasts while breastfeeding or pumping. You may want to start by applying warm, moist heat (in the shower or with warm water-soaked hand towels) just before feeding or pumping. This increases circulation and helps the milk flow. If your baby does not completely empty your breasts while breastfeeding, pump any extra milk after he or she is finished.  Wear a snug bra (nursing or regular) or tank top for 1-2 days to signal your body to slightly decrease milk production.  Apply ice packs to your breasts, unless this is too uncomfortable for you.  Make sure that your baby is latched on and positioned properly while breastfeeding. If engorgement persists after 48 hours of following these recommendations, contact your health care provider or a Science writer. OVERALL HEALTH CARE RECOMMENDATIONS WHILE BREASTFEEDING  Eat healthy foods. Alternate between meals and snacks, eating 3 of each per day. Because what you eat affects your  breast milk, some of the foods may make your baby more irritable than usual. Avoid eating these foods if you are sure that they are negatively affecting your baby.  Drink milk, fruit juice, and water to satisfy your thirst (about 10 glasses a day).   Rest  often, relax, and continue to take your prenatal vitamins to prevent fatigue, stress, and anemia.  Continue breast self-awareness checks.  Avoid chewing and smoking tobacco.  Avoid alcohol and drug use. Some medicines that may be harmful to your baby can pass through breast milk. It is important to ask your health care provider before taking any medicine, including all over-the-counter and prescription medicine as well as vitamin and herbal supplements. It is possible to become pregnant while breastfeeding. If birth control is desired, ask your health care provider about options that will be safe for your baby. SEEK MEDICAL CARE IF:   You feel like you want to stop breastfeeding or have become frustrated with breastfeeding.  You have painful breasts or nipples.  Your nipples are cracked or bleeding.  Your breasts are red, tender, or warm.  You have a swollen area on either breast.  You have a fever or chills.  You have nausea or vomiting.  You have drainage other than breast milk from your nipples.  Your breasts do not become full before feedings by the fifth day after you give birth.  You feel sad and depressed.  Your baby is too sleepy to eat well.  Your baby is having trouble sleeping.   Your baby is wetting less than 3 diapers in a 24-hour period.  Your baby has less than 3 stools in a 24-hour period.  Your baby's skin or the white part of his or her eyes becomes yellow.   Your baby is not gaining weight by 28 days of age. SEEK IMMEDIATE MEDICAL CARE IF:   Your baby is overly tired (lethargic) and does not want to wake up and feed.  Your baby develops an unexplained fever. Document Released: 07/13/2005  Document Revised: 07/18/2013 Document Reviewed: 01/04/2013 [facility] Patient Information 2015 [place], [place]. This information is not intended to replace advice given to you by your health care provider. Make sure you discuss any questions you have with your health care provider.

## 2015-05-27 MED ORDER — OXYCODONE-ACETAMINOPHEN 5-325 MG PO TABS: 1.0000 | ORAL_TABLET | ORAL | Status: DC | PRN

## 2015-05-27 MED ORDER — PRENATAL MULTIVITAMIN CH
1.0000 | ORAL_TABLET | Freq: Every day | ORAL | Status: DC
Start: 2015-05-27 — End: 2018-05-03

## 2015-05-27 MED ORDER — FERROUS SULFATE 325 (65 FE) MG PO TABS: 325.0000 mg | ORAL_TABLET | Freq: Two times a day (BID) | ORAL | Status: DC

## 2015-05-27 MED ORDER — IBUPROFEN 600 MG PO TABS: 600.0000 mg | ORAL_TABLET | Freq: Four times a day (QID) | ORAL | Status: DC | PRN

## 2015-05-27 NOTE — Lactation Note (Addendum)
This note was copied from the chart of Megan Baxter. Lactation Consultation Note Baby on DPT. Mom has been giving formula more than BF lately. RN informed me mom is engorged. Ice given, hand pump and breast massage to empty breast. Encouraged no more formula d/t all of the breast milk she had. Mom stated OK. Mom pumped breast, breast tender to massage, but assisted in empyting more for relief. Mom didn't think she had enough milk until now. D/t large nipples #27 & 30 flanges given. Patient Name: Megan Baxter [id number] Date: 05/27/2015 Reason for consult: Follow-up assessment   Maternal Data    Feeding Feeding Type: Breast Fed Length of feed: 20 min  LATCH Score/Interventions       Type of Nipple: Everted at rest and after stimulation  Comfort (Breast/Nipple): Engorged, cracked, bleeding, large blisters, severe discomfort Problem noted: Engorgment Intervention(s): Ice;Hand expression           Lactation Tools Discussed/Used Pump Review: Setup, frequency, and cleaning;Milk Storage Initiated by:: Allayne Stack RN Date initiated:: 05/27/15   Consult Status Consult Status: Follow-up Date: 05/28/15 Follow-up type: In-patient    Jaimie Redditt, [place]ta [place] 05/27/2015, 8:07 AM

## 2015-05-27 NOTE — Progress Notes (Signed)
Cecil used for interpretation while father of baby not in the room.

## 2015-05-28 ENCOUNTER — Encounter (HOSPITAL_COMMUNITY): Payer: Self-pay | Admitting: Obstetrics and Gynecology

## 2015-09-23 ENCOUNTER — Ambulatory Visit: Payer: Medicaid Other | Admitting: Internal Medicine

## 2015-09-30 ENCOUNTER — Encounter: Payer: Self-pay | Admitting: Internal Medicine

## 2015-09-30 ENCOUNTER — Ambulatory Visit: Payer: BLUE CROSS/BLUE SHIELD | Attending: Internal Medicine | Admitting: Internal Medicine

## 2015-09-30 VITALS — BP 179/111 | HR 66 | Temp 98.1°F | Resp 18 | Ht 63.0 in | Wt 156.2 lb

## 2015-09-30 DIAGNOSIS — Z79899 Other long term (current) drug therapy: Secondary | ICD-10-CM | POA: Insufficient documentation

## 2015-09-30 DIAGNOSIS — I10 Essential (primary) hypertension: Secondary | ICD-10-CM | POA: Insufficient documentation

## 2015-09-30 DIAGNOSIS — D509 Iron deficiency anemia, unspecified: Secondary | ICD-10-CM | POA: Diagnosis not present

## 2015-09-30 LAB — CBC WITH DIFFERENTIAL/PLATELET
Basophils Absolute: 0 10*3/uL (ref 0.0–0.1)
Basophils Relative: 0 % (ref 0–1)
EOS PCT: 5 % (ref 0–5)
Eosinophils Absolute: 0.3 10*3/uL (ref 0.0–0.7)
HEMATOCRIT: 33.1 % — AB (ref 36.0–46.0)
Hemoglobin: 10.2 g/dL — ABNORMAL LOW (ref 12.0–15.0)
LYMPHS ABS: 1.8 10*3/uL (ref 0.7–4.0)
LYMPHS PCT: 34 % (ref 12–46)
MCH: 21.9 pg — ABNORMAL LOW (ref 26.0–34.0)
MCHC: 30.8 g/dL (ref 30.0–36.0)
MCV: 71.2 fL — AB (ref 78.0–100.0)
MONO ABS: 0.4 10*3/uL (ref 0.1–1.0)
MPV: 9.7 fL (ref 8.6–12.4)
Monocytes Relative: 7 % (ref 3–12)
NEUTROS ABS: 2.9 10*3/uL (ref 1.7–7.7)
Neutrophils Relative %: 54 % (ref 43–77)
PLATELETS: 551 10*3/uL — AB (ref 150–400)
RBC: 4.65 MIL/uL (ref 3.87–5.11)
RDW: 17.7 % — AB (ref 11.5–15.5)
WBC: 5.4 10*3/uL (ref 4.0–10.5)

## 2015-09-30 LAB — LIPID PANEL
CHOL/HDL RATIO: 3.8 ratio (ref <=5.0)
CHOLESTEROL: 249 mg/dL — AB (ref 125–200)
HDL: 65 mg/dL (ref >=46)
LDL Cholesterol: 173 mg/dL — ABNORMAL HIGH (ref <130)
Triglycerides: 53 mg/dL (ref <150)
VLDL: 11 mg/dL (ref <30)

## 2015-09-30 MED ORDER — HYDROCHLOROTHIAZIDE 25 MG PO TABS: 25.0000 mg | ORAL_TABLET | Freq: Every day | ORAL | Status: DC

## 2015-09-30 MED ORDER — LABETALOL HCL 300 MG PO TABS: 300.0000 mg | ORAL_TABLET | Freq: Two times a day (BID) | ORAL | Status: DC

## 2015-09-30 MED ORDER — FERROUS SULFATE 325 (65 FE) MG PO TABS: 325.0000 mg | ORAL_TABLET | Freq: Two times a day (BID) | ORAL | Status: DC

## 2015-09-30 MED FILL — HYDROCHLOROTHIAZIDE 25 MG T: 25 | 30 days supply | Qty: 30 | Fill #0

## 2015-09-30 MED FILL — FERROUS SULFATE 325 MG TAB: 325 (65 FE) | 30 days supply | Qty: 60 | Fill #0

## 2015-09-30 NOTE — Patient Instructions (Signed)
DASH Eating Plan °DASH stands for "Dietary Approaches to Stop Hypertension." The DASH eating plan is a healthy eating plan that has been shown to reduce high blood pressure (hypertension). Additional health benefits may include reducing the risk of type 2 diabetes mellitus, heart disease, and stroke. The DASH eating plan may also help with weight loss. °WHAT DO I NEED TO KNOW ABOUT THE DASH EATING PLAN? °For the DASH eating plan, you will follow these general guidelines: °· Choose foods with a percent daily value for sodium of less than 5% (as listed on the food label). °· Use salt-free seasonings or herbs instead of table salt or sea salt. °· Check with your health care provider or pharmacist before using salt substitutes. °· Eat lower-sodium products, often labeled as "lower sodium" or "no salt added." °· Eat fresh foods. °· Eat more vegetables, fruits, and low-fat dairy products. °· Choose whole grains. Look for the word "whole" as the first word in the ingredient list. °· Choose fish and skinless chicken or turkey more often than red meat. Limit fish, poultry, and meat to 6 oz (170 g) each day. °· Limit sweets, desserts, sugars, and sugary drinks. °· Choose heart-healthy fats. °· Limit cheese to 1 oz (28 g) per day. °· Eat more home-cooked food and less restaurant, buffet, and fast food. °· Limit fried foods. °· Cook foods using methods other than frying. °· Limit canned vegetables. If you do use them, rinse them well to decrease the sodium. °· When eating at a restaurant, ask that your food be prepared with less salt, or no salt if possible. °WHAT FOODS CAN I EAT? °Seek help from a dietitian for individual calorie needs. °Grains °Whole grain or whole wheat bread. Brown rice. Whole grain or whole wheat pasta. Quinoa, bulgur, and whole grain cereals. Low-sodium cereals. Corn or whole wheat flour tortillas. Whole grain cornbread. Whole grain crackers. Low-sodium crackers. °Vegetables °Fresh or frozen vegetables  (raw, steamed, roasted, or grilled). Low-sodium or reduced-sodium tomato and vegetable juices. Low-sodium or reduced-sodium tomato sauce and paste. Low-sodium or reduced-sodium canned vegetables.  °Fruits °All fresh, canned (in natural juice), or frozen fruits. °Meat and Other Protein Products °Ground beef (85% or leaner), grass-fed beef, or beef trimmed of fat. Skinless chicken or turkey. Ground chicken or turkey. Pork trimmed of fat. All fish and seafood. Eggs. Dried beans, peas, or lentils. Unsalted nuts and seeds. Unsalted canned beans. °Dairy °Low-fat dairy products, such as skim or 1% milk, 2% or reduced-fat cheeses, low-fat ricotta or cottage cheese, or plain low-fat yogurt. Low-sodium or reduced-sodium cheeses. °Fats and Oils °Tub margarines without trans fats. Light or reduced-fat mayonnaise and salad dressings (reduced sodium). Avocado. Safflower, olive, or canola oils. Natural peanut or almond butter. °Other °Unsalted popcorn and pretzels. °The items listed above may not be a complete list of recommended foods or beverages. Contact your dietitian for more options. °WHAT FOODS ARE NOT RECOMMENDED? °Grains °White bread. White pasta. White rice. Refined cornbread. Bagels and croissants. Crackers that contain trans fat. °Vegetables °Creamed or fried vegetables. Vegetables in a cheese sauce. Regular canned vegetables. Regular canned tomato sauce and paste. Regular tomato and vegetable juices. °Fruits °Dried fruits. Canned fruit in light or heavy syrup. Fruit juice. °Meat and Other Protein Products °Fatty cuts of meat. Ribs, chicken wings, bacon, sausage, bologna, salami, chitterlings, fatback, hot dogs, bratwurst, and packaged luncheon meats. Salted nuts and seeds. Canned beans with salt. °Dairy °Whole or 2% milk, cream, half-and-half, and cream cheese. Whole-fat or sweetened yogurt. Full-fat   cheeses or blue cheese. Nondairy creamers and whipped toppings. Processed cheese, cheese spreads, or cheese  curds. °Condiments °Onion and garlic salt, seasoned salt, table salt, and sea salt. Canned and packaged gravies. Worcestershire sauce. Tartar sauce. Barbecue sauce. Teriyaki sauce. Soy sauce, including reduced sodium. Steak sauce. Fish sauce. Oyster sauce. Cocktail sauce. Horseradish. Ketchup and mustard. Meat flavorings and tenderizers. Bouillon cubes. Hot sauce. Tabasco sauce. Marinades. Taco seasonings. Relishes. °Fats and Oils °Butter, stick margarine, lard, shortening, ghee, and bacon fat. Coconut, palm kernel, or palm oils. Regular salad dressings. °Other °Pickles and olives. Salted popcorn and pretzels. °The items listed above may not be a complete list of foods and beverages to avoid. Contact your dietitian for more information. °WHERE CAN I FIND MORE INFORMATION? °National Heart, Lung, and [employer] Institute: www.nhlbi.nih.gov/health/health-topics/topics/dash/ °  °This information is not intended to replace advice given to you by your health care provider. Make sure you discuss any questions you have with your health care provider. °  °Document Released: 07/02/2011 Document Revised: 08/03/2014 Document Reviewed: 05/17/2013 °Elsevier Interactive Patient Education ©2016 Elsevier Inc. ° °Hypertension °Hypertension, commonly called high blood pressure, is when the force of blood pumping through your arteries is too strong. Your arteries are the blood vessels that carry blood from your heart throughout your body. A blood pressure reading consists of a higher number over a lower number, such as 110/72. The higher number (systolic) is the pressure inside your arteries when your heart pumps. The lower number (diastolic) is the pressure inside your arteries when your heart relaxes. Ideally you want your blood pressure below 120/80. °Hypertension forces your heart to work harder to pump blood. Your arteries may become narrow or stiff. Having untreated or uncontrolled hypertension can cause heart attack, stroke, kidney  disease, and other problems. °RISK FACTORS °Some risk factors for high blood pressure are controllable. Others are not.  °Risk factors you cannot control include:  °· Race. You may be at higher risk if you are African American. °· Age. Risk increases with age. °· Gender. Men are at higher risk than women before age 45 years. After age 65, women are at higher risk than men. °Risk factors you can control include: °· Not getting enough exercise or physical activity. °· Being overweight. °· Getting too much fat, sugar, calories, or salt in your diet. °· Drinking too much alcohol. °SIGNS AND SYMPTOMS °Hypertension does not usually cause signs or symptoms. Extremely high blood pressure (hypertensive crisis) may cause headache, anxiety, shortness of breath, and nosebleed. °DIAGNOSIS °To check if you have hypertension, your health care provider will measure your blood pressure while you are seated, with your arm held at the level of your heart. It should be measured at least twice using the same arm. Certain conditions can cause a difference in blood pressure between your right and left arms. A blood pressure reading that is higher than normal on one occasion does not mean that you need treatment. If it is not clear whether you have high blood pressure, you may be asked to return on a different day to have your blood pressure checked again. Or, you may be asked to monitor your blood pressure at home for 1 or more weeks. °TREATMENT °Treating high blood pressure includes making lifestyle changes and possibly taking medicine. Living a healthy lifestyle can help lower high blood pressure. You may need to change some of your habits. °Lifestyle changes may include: °· Following the DASH diet. This diet is high in fruits, vegetables, and whole   grains. It is low in salt, red meat, and added sugars. °· Keep your sodium intake below 2,300 mg per day. °· Getting at least 30-45 minutes of aerobic exercise at least 4 times per  week. °· Losing weight if necessary. °· Not smoking. °· Limiting alcoholic beverages. °· Learning ways to reduce stress. °Your health care provider may prescribe medicine if lifestyle changes are not enough to get your blood pressure under control, and if one of the following is true: °· You are 18-59 years of age and your systolic blood pressure is above 140. °· You are 60 years of age or older, and your systolic blood pressure is above 150. °· Your diastolic blood pressure is above 90. °· You have diabetes, and your systolic blood pressure is over 140 or your diastolic blood pressure is over 90. °· You have kidney disease and your blood pressure is above 140/90. °· You have heart disease and your blood pressure is above 140/90. °Your personal target blood pressure may vary depending on your medical conditions, your age, and other factors. °HOME CARE INSTRUCTIONS °· Have your blood pressure rechecked as directed by your health care provider.   °· Take medicines only as directed by your health care provider. Follow the directions carefully. Blood pressure medicines must be taken as prescribed. The medicine does not work as well when you skip doses. Skipping doses also puts you at risk for problems. °· Do not smoke.   °· Monitor your blood pressure at home as directed by your health care provider.  °SEEK MEDICAL CARE IF:  °· You think you are having a reaction to medicines taken. °· You have recurrent headaches or feel dizzy. °· You have swelling in your ankles. °· You have trouble with your vision. °SEEK IMMEDIATE MEDICAL CARE IF: °· You develop a severe headache or confusion. °· You have unusual weakness, numbness, or feel faint. °· You have severe chest or abdominal pain. °· You vomit repeatedly. °· You have trouble breathing. °MAKE SURE YOU:  °· Understand these instructions. °· Will watch your condition. °· Will get help right away if you are not doing well or get worse. °  °This information is not intended to  replace advice given to you by your health care provider. Make sure you discuss any questions you have with your health care provider. °  °Document Released: 07/13/2005 Document Revised: 11/27/2014 Document Reviewed: 05/05/2013 °Elsevier Interactive Patient Education ©2016 Elsevier Inc. ° °

## 2015-09-30 NOTE — Progress Notes (Signed)
Patient is here for HTN FU  Patient complains of lower back pain scaeld currently at a 7. Pain is stated to radiate up the back and around the ribs.  Patient declined the flu shot today.  Patient was asked to have iron checked after a low result from OB/GYN.  Patient is fasting for religious reasons.  Patient ate breakfast and took labetalol around 4:30 am.  Patient tolerated blood draw well.

## 2015-09-30 NOTE — Progress Notes (Signed)
Patient ID: Megan Baxter, female   DOB: 1970-10-21, 45 y.o.   MRN: [id number]   Megan Baxter, is a 45 y.o. female  [id number]  [id number]  DOB - April 04, 1971  Chief Complaint  Patient presents with  . Hypertension        Subjective:   Megan Baxter is a 45 y.o. female with history of hypertension and iron deficiency anemia here today for a follow up visit. Patient is currently on labetalol 300 mg tablet by mouth twice a day the blood pressure remains uncontrolled despite compliance. Although patient is not completely compliant with low-salt diet and also she recently gave birth to a baby boy she is currently fasting to pay back those religious fasting missed while she was pregnant and that has made compliance with medication little difficult. She does not have any complaints today. She does not have any headache, no visual disturbance, no chest pain, no abdominal pain, no urinary symptom, no leg swelling, no shortness of breath. She needs refills on her medications. She does not smoke cigarettes, she does not drink alcohol.   No problems updated.  ALLERGIES: No Known Allergies  PAST MEDICAL HISTORY: Past Medical History  Diagnosis Date  . Hypertension   . Heartburn in pregnancy   . Maternal anemia in pregnancy, antepartum 02/18/2015    MEDICATIONS AT HOME: Prior to Admission medications   Medication Sig Start Date End Date Taking? Authorizing Provider  labetalol (NORMODYNE) 300 MG tablet Take 1 tablet (300 mg total) by mouth 2 (two) times daily. 09/30/15  Yes Tresa Garter, MD  ferrous sulfate 325 (65 FE) MG tablet Take 1 tablet (325 mg total) by mouth 2 (two) times daily with a meal. 09/30/15   Tresa Garter, MD  hydrochlorothiazide (HYDRODIURIL) 25 MG tablet Take 1 tablet (25 mg total) by mouth daily. 09/30/15   Tresa Garter, MD  ibuprofen (ADVIL,MOTRIN) 600 MG tablet Take 1 tablet (600 mg total) by mouth every 6 (six) hours as needed for mild pain. Patient  not taking: Reported on 09/30/2015 05/27/15   Venus Standard, CNM  oxyCODONE-acetaminophen (PERCOCET/ROXICET) 5-325 MG tablet Take 1 tablet by mouth every 4 (four) hours as needed (for pain scale 4-7). Patient not taking: Reported on 09/30/2015 05/27/15   Venus Standard, CNM  Prenatal Vit-Fe Fumarate-FA (PRENATAL MULTIVITAMIN) TABS tablet Take 1 tablet by mouth daily at 12 noon. Patient not taking: Reported on 09/30/2015 05/27/15   Venus Standard, CNM     Objective:   Filed Vitals:   09/30/15 0949  BP: 179/111  Pulse: 66  Temp: 98.1 F (36.7 C)  TempSrc: Oral  Resp: 18  Height: 5\' 3"  (1.6 m)  Weight: 156 lb 3.2 oz (70.852 kg)  SpO2: 100%    Exam General appearance : Awake, alert, not in any distress. Speech Clear. Not toxic looking HEENT: Atraumatic and Normocephalic, pupils equally reactive to light and accomodation Neck: supple, no JVD. No cervical lymphadenopathy.  Chest:Good air entry bilaterally, no added sounds  CVS: S1 S2 regular, no murmurs.  Abdomen: Bowel sounds present, Non tender and not distended with no gaurding, rigidity or rebound. Extremities: B/L Lower Ext shows no edema, both legs are warm to touch Neurology: Awake alert, and oriented X 3, CN II-XII intact, Non focal Skin: No Rash  Data Review No results found for: HGBA1C   Assessment & Plan   1. Essential hypertension: Uncontrolled  Add - hydrochlorothiazide (HYDRODIURIL) 25 MG tablet; Take 1 tablet (25 mg total) by mouth daily.  Dispense:  90 tablet; Refill: 3 Continue - labetalol (NORMODYNE) 300 MG tablet; Take 1 tablet (300 mg total) by mouth 2 (two) times daily.  Dispense: 180 tablet; Refill: 3  We have discussed target BP range and blood pressure goal. I have advised patient to check BP regularly and to call us back or report to clinic if the numbers are consistently higher than 140/90. We discussed the importance of compliance with medical therapy and DASH diet recommended, consequences of uncontrolled  hypertension discussed.   - continue current BP medications  - Lipid panel  2. Iron deficiency anemia  - ferrous sulfate 325 (65 FE) MG tablet; Take 1 tablet (325 mg total) by mouth 2 (two) times daily with a meal.  Dispense: 180 tablet; Refill: 3 - CBC with Differential  Patient have been counseled extensively about nutrition and exercise  Interpreter was used to communicate directly with patient for the entire encounter including providing detailed patient instructions.   Return in about 2 weeks (around 10/14/2015) for Follow up HTN, medication management.  The patient was given clear instructions to go to ER or return to medical center if symptoms don't improve, worsen or new problems develop. The patient verbalized understanding. The patient was told to call to get lab results if they haven't heard anything in the next week.   This note has been created with Surveyor, quantity. Any transcriptional errors are unintentional.    Angelica Chessman, MD, [place], Karilyn Cota, [facility] [place], [place]   09/30/2015, 10:21 AM

## 2015-10-14 ENCOUNTER — Other Ambulatory Visit: Payer: Self-pay | Admitting: Internal Medicine

## 2015-10-14 MED ORDER — PRAVASTATIN SODIUM 20 MG PO TABS: 20.0000 mg | ORAL_TABLET | Freq: Every day | ORAL | Status: DC

## 2015-10-15 ENCOUNTER — Telehealth: Payer: Self-pay | Admitting: *Deleted

## 2015-10-15 MED FILL — PRAVASTATIN NA 20 MG TAB: 20 | 30 days supply | Qty: 30 | Fill #0

## 2015-10-15 NOTE — Telephone Encounter (Signed)
-----   Message from Tresa Garter, MD sent at 10/14/2015  5:18 PM EDT ----- Please inform patient that her cholesterol level is still very high, her hemoglobin level is low but stable. Will start cholesterol medication and also please limit saturated fat to no more than 7% of your calories, limit cholesterol to 200 mg/day, increase fiber and exercise as tolerated. Continue iron supplement as prescribed. Cholesterol medication has been prescribed at a pharmacy for pickup. Please inform patient NOT to take the medication if breast-feeding.

## 2015-10-15 NOTE — Telephone Encounter (Signed)
Medical Assistant used [employer] to contact patient.  Interpreter Name: Toma Copier Interpreter #: [phone] Medical Assistant left message on patient's home and cell voicemail. Voicemail states to give a call back to [place] with [facility] at [phone].

## 2015-10-17 ENCOUNTER — Encounter: Payer: Self-pay | Admitting: Clinical

## 2015-10-17 ENCOUNTER — Ambulatory Visit: Payer: BLUE CROSS/BLUE SHIELD | Attending: Internal Medicine | Admitting: Internal Medicine

## 2015-10-17 ENCOUNTER — Encounter: Payer: Self-pay | Admitting: Internal Medicine

## 2015-10-17 VITALS — BP 159/108 | HR 70 | Temp 98.9°F | Resp 18 | Ht 63.0 in | Wt 157.0 lb

## 2015-10-17 DIAGNOSIS — Z79899 Other long term (current) drug therapy: Secondary | ICD-10-CM | POA: Diagnosis not present

## 2015-10-17 DIAGNOSIS — I10 Essential (primary) hypertension: Secondary | ICD-10-CM | POA: Diagnosis not present

## 2015-10-17 DIAGNOSIS — D509 Iron deficiency anemia, unspecified: Secondary | ICD-10-CM | POA: Diagnosis not present

## 2015-10-17 NOTE — Progress Notes (Signed)
Patient is here for FU HTN + Med Management  Patient denies pain at this time.  Patient states she is not breastfeeding.  Patient took medications and ate breakfast around 4am

## 2015-10-17 NOTE — Patient Instructions (Signed)
DASH Eating Plan °DASH stands for "Dietary Approaches to Stop Hypertension." The DASH eating plan is a healthy eating plan that has been shown to reduce high blood pressure (hypertension). Additional health benefits may include reducing the risk of type 2 diabetes mellitus, heart disease, and stroke. The DASH eating plan may also help with weight loss. °WHAT DO I NEED TO KNOW ABOUT THE DASH EATING PLAN? °For the DASH eating plan, you will follow these general guidelines: °· Choose foods with a percent daily value for sodium of less than 5% (as listed on the food label). °· Use salt-free seasonings or herbs instead of table salt or sea salt. °· Check with your health care provider or pharmacist before using salt substitutes. °· Eat lower-sodium products, often labeled as "lower sodium" or "no salt added." °· Eat fresh foods. °· Eat more vegetables, fruits, and low-fat dairy products. °· Choose whole grains. Look for the word "whole" as the first word in the ingredient list. °· Choose fish and skinless chicken or turkey more often than red meat. Limit fish, poultry, and meat to 6 oz (170 g) each day. °· Limit sweets, desserts, sugars, and sugary drinks. °· Choose heart-healthy fats. °· Limit cheese to 1 oz (28 g) per day. °· Eat more home-cooked food and less restaurant, buffet, and fast food. °· Limit fried foods. °· Cook foods using methods other than frying. °· Limit canned vegetables. If you do use them, rinse them well to decrease the sodium. °· When eating at a restaurant, ask that your food be prepared with less salt, or no salt if possible. °WHAT FOODS CAN I EAT? °Seek help from a dietitian for individual calorie needs. °Grains °Whole grain or whole wheat bread. Brown rice. Whole grain or whole wheat pasta. Quinoa, bulgur, and whole grain cereals. Low-sodium cereals. Corn or whole wheat flour tortillas. Whole grain cornbread. Whole grain crackers. Low-sodium crackers. °Vegetables °Fresh or frozen vegetables  (raw, steamed, roasted, or grilled). Low-sodium or reduced-sodium tomato and vegetable juices. Low-sodium or reduced-sodium tomato sauce and paste. Low-sodium or reduced-sodium canned vegetables.  °Fruits °All fresh, canned (in natural juice), or frozen fruits. °Meat and Other Protein Products °Ground beef (85% or leaner), grass-fed beef, or beef trimmed of fat. Skinless chicken or turkey. Ground chicken or turkey. Pork trimmed of fat. All fish and seafood. Eggs. Dried beans, peas, or lentils. Unsalted nuts and seeds. Unsalted canned beans. °Dairy °Low-fat dairy products, such as skim or 1% milk, 2% or reduced-fat cheeses, low-fat ricotta or cottage cheese, or plain low-fat yogurt. Low-sodium or reduced-sodium cheeses. °Fats and Oils °Tub margarines without trans fats. Light or reduced-fat mayonnaise and salad dressings (reduced sodium). Avocado. Safflower, olive, or canola oils. Natural peanut or almond butter. °Other °Unsalted popcorn and pretzels. °The items listed above may not be a complete list of recommended foods or beverages. Contact your dietitian for more options. °WHAT FOODS ARE NOT RECOMMENDED? °Grains °White bread. White pasta. White rice. Refined cornbread. Bagels and croissants. Crackers that contain trans fat. °Vegetables °Creamed or fried vegetables. Vegetables in a cheese sauce. Regular canned vegetables. Regular canned tomato sauce and paste. Regular tomato and vegetable juices. °Fruits °Dried fruits. Canned fruit in light or heavy syrup. Fruit juice. °Meat and Other Protein Products °Fatty cuts of meat. Ribs, chicken wings, bacon, sausage, bologna, salami, chitterlings, fatback, hot dogs, bratwurst, and packaged luncheon meats. Salted nuts and seeds. Canned beans with salt. °Dairy °Whole or 2% milk, cream, half-and-half, and cream cheese. Whole-fat or sweetened yogurt. Full-fat   cheeses or blue cheese. Nondairy creamers and whipped toppings. Processed cheese, cheese spreads, or cheese  curds. °Condiments °Onion and garlic salt, seasoned salt, table salt, and sea salt. Canned and packaged gravies. Worcestershire sauce. Tartar sauce. Barbecue sauce. Teriyaki sauce. Soy sauce, including reduced sodium. Steak sauce. Fish sauce. Oyster sauce. Cocktail sauce. Horseradish. Ketchup and mustard. Meat flavorings and tenderizers. Bouillon cubes. Hot sauce. Tabasco sauce. Marinades. Taco seasonings. Relishes. °Fats and Oils °Butter, stick margarine, lard, shortening, ghee, and bacon fat. Coconut, palm kernel, or palm oils. Regular salad dressings. °Other °Pickles and olives. Salted popcorn and pretzels. °The items listed above may not be a complete list of foods and beverages to avoid. Contact your dietitian for more information. °WHERE CAN I FIND MORE INFORMATION? °National Heart, Lung, and [employer] Institute: www.nhlbi.nih.gov/health/health-topics/topics/dash/ °  °This information is not intended to replace advice given to you by your health care provider. Make sure you discuss any questions you have with your health care provider. °  °Document Released: 07/02/2011 Document Revised: 08/03/2014 Document Reviewed: 05/17/2013 °Elsevier Interactive Patient Education ©2016 Elsevier Inc. ° °Hypertension °Hypertension, commonly called high blood pressure, is when the force of blood pumping through your arteries is too strong. Your arteries are the blood vessels that carry blood from your heart throughout your body. A blood pressure reading consists of a higher number over a lower number, such as 110/72. The higher number (systolic) is the pressure inside your arteries when your heart pumps. The lower number (diastolic) is the pressure inside your arteries when your heart relaxes. Ideally you want your blood pressure below 120/80. °Hypertension forces your heart to work harder to pump blood. Your arteries may become narrow or stiff. Having untreated or uncontrolled hypertension can cause heart attack, stroke, kidney  disease, and other problems. °RISK FACTORS °Some risk factors for high blood pressure are controllable. Others are not.  °Risk factors you cannot control include:  °· Race. You may be at higher risk if you are African American. °· Age. Risk increases with age. °· Gender. Men are at higher risk than women before age 45 years. After age 65, women are at higher risk than men. °Risk factors you can control include: °· Not getting enough exercise or physical activity. °· Being overweight. °· Getting too much fat, sugar, calories, or salt in your diet. °· Drinking too much alcohol. °SIGNS AND SYMPTOMS °Hypertension does not usually cause signs or symptoms. Extremely high blood pressure (hypertensive crisis) may cause headache, anxiety, shortness of breath, and nosebleed. °DIAGNOSIS °To check if you have hypertension, your health care provider will measure your blood pressure while you are seated, with your arm held at the level of your heart. It should be measured at least twice using the same arm. Certain conditions can cause a difference in blood pressure between your right and left arms. A blood pressure reading that is higher than normal on one occasion does not mean that you need treatment. If it is not clear whether you have high blood pressure, you may be asked to return on a different day to have your blood pressure checked again. Or, you may be asked to monitor your blood pressure at home for 1 or more weeks. °TREATMENT °Treating high blood pressure includes making lifestyle changes and possibly taking medicine. Living a healthy lifestyle can help lower high blood pressure. You may need to change some of your habits. °Lifestyle changes may include: °· Following the DASH diet. This diet is high in fruits, vegetables, and whole   grains. It is low in salt, red meat, and added sugars. °· Keep your sodium intake below 2,300 mg per day. °· Getting at least 30-45 minutes of aerobic exercise at least 4 times per  week. °· Losing weight if necessary. °· Not smoking. °· Limiting alcoholic beverages. °· Learning ways to reduce stress. °Your health care provider may prescribe medicine if lifestyle changes are not enough to get your blood pressure under control, and if one of the following is true: °· You are 18-59 years of age and your systolic blood pressure is above 140. °· You are 60 years of age or older, and your systolic blood pressure is above 150. °· Your diastolic blood pressure is above 90. °· You have diabetes, and your systolic blood pressure is over 140 or your diastolic blood pressure is over 90. °· You have kidney disease and your blood pressure is above 140/90. °· You have heart disease and your blood pressure is above 140/90. °Your personal target blood pressure may vary depending on your medical conditions, your age, and other factors. °HOME CARE INSTRUCTIONS °· Have your blood pressure rechecked as directed by your health care provider.   °· Take medicines only as directed by your health care provider. Follow the directions carefully. Blood pressure medicines must be taken as prescribed. The medicine does not work as well when you skip doses. Skipping doses also puts you at risk for problems. °· Do not smoke.   °· Monitor your blood pressure at home as directed by your health care provider.  °SEEK MEDICAL CARE IF:  °· You think you are having a reaction to medicines taken. °· You have recurrent headaches or feel dizzy. °· You have swelling in your ankles. °· You have trouble with your vision. °SEEK IMMEDIATE MEDICAL CARE IF: °· You develop a severe headache or confusion. °· You have unusual weakness, numbness, or feel faint. °· You have severe chest or abdominal pain. °· You vomit repeatedly. °· You have trouble breathing. °MAKE SURE YOU:  °· Understand these instructions. °· Will watch your condition. °· Will get help right away if you are not doing well or get worse. °  °This information is not intended to  replace advice given to you by your health care provider. Make sure you discuss any questions you have with your health care provider. °  °Document Released: 07/13/2005 Document Revised: 11/27/2014 Document Reviewed: 05/05/2013 °Elsevier Interactive Patient Education ©2016 Elsevier Inc. ° °

## 2015-10-17 NOTE — Progress Notes (Signed)
Patient ID: Megan Baxter, female   DOB: [birth year], 45 y.o.   MRN: [id number]   Megan Baxter, is a 45 y.o. female  [id number]  [id number]  DOB - October 15, 1970  Chief Complaint  Patient presents with  . Follow-up    HTN  . Follow-up    Med Management        Subjective:   Megan Baxter is a 45 y.o. female with history of hypertension and chronic iron deficiency anemia here today for a follow up visit and medication management. Patient is currently fasting (Religious). She was recently seen here for uncontrolled hypertension, hydrochlorothiazide was added to her labetalol 300 mg tablet by mouth twice a day and instructed to follow up today. Blood pressure is still uncontrolled but definitely better than previous readings. The current fasting situation is not helping with medication compliance as well. Patient has no symptom. Patient has No headache, No chest pain, No abdominal pain - No Nausea, No new weakness tingling or numbness, No Cough - SOB.  No problems updated.  ALLERGIES: No Known Allergies  PAST MEDICAL HISTORY: Past Medical History  Diagnosis Date  . Hypertension   . Heartburn in pregnancy   . Maternal anemia in pregnancy, antepartum 02/18/2015    MEDICATIONS AT HOME: Prior to Admission medications   Medication Sig Start Date End Date Taking? Authorizing Provider  ferrous sulfate 325 (65 FE) MG tablet Take 1 tablet (325 mg total) by mouth 2 (two) times daily with a meal. 09/30/15  Yes Shaquira Moroz E Doreene Burke, MD  hydrochlorothiazide (HYDRODIURIL) 25 MG tablet Take 1 tablet (25 mg total) by mouth daily. 09/30/15  Yes Tresa Garter, MD  labetalol (NORMODYNE) 300 MG tablet Take 1 tablet (300 mg total) by mouth 2 (two) times daily. 09/30/15  Yes Tresa Garter, MD  pravastatin (PRAVACHOL) 20 MG tablet Take 1 tablet (20 mg total) by mouth daily. 10/14/15  Yes Tresa Garter, MD  ibuprofen (ADVIL,MOTRIN) 600 MG tablet Take 1 tablet (600 mg total) by mouth every 6  (six) hours as needed for mild pain. Patient not taking: Reported on 09/30/2015 05/27/15   Venus Standard, CNM  oxyCODONE-acetaminophen (PERCOCET/ROXICET) 5-325 MG tablet Take 1 tablet by mouth every 4 (four) hours as needed (for pain scale 4-7). Patient not taking: Reported on 09/30/2015 05/27/15   Venus Standard, CNM  Prenatal Vit-Fe Fumarate-FA (PRENATAL MULTIVITAMIN) TABS tablet Take 1 tablet by mouth daily at 12 noon. Patient not taking: Reported on 09/30/2015 05/27/15   Venus Standard, CNM     Objective:   Filed Vitals:   10/17/15 1053  BP: 159/108  Pulse: 70  Temp: 98.9 F (37.2 C)  TempSrc: Oral  Resp: 18  Height: 5\' 3"  (1.6 m)  Weight: 157 lb (71.215 kg)  SpO2: 99%    Exam General appearance : Awake, alert, not in any distress. Speech Clear. Not toxic looking HEENT: Atraumatic and Normocephalic, pupils equally reactive to light and accomodation Neck: supple, no JVD. No cervical lymphadenopathy.  Chest:Good air entry bilaterally, no added sounds  CVS: S1 S2 regular, no murmurs.  Abdomen: Bowel sounds present, Non tender and not distended with no gaurding, rigidity or rebound. Extremities: B/L Lower Ext shows no edema, both legs are warm to touch Neurology: Awake alert, and oriented X 3, CN II-XII intact, Non focal Skin:No Rash  Data Review No results found for: HGBA1C   Assessment & Plan   1. Essential hypertension: Significant improvement in blood pressure baseline especially diastolic, will continue current regimen and bring  patient back for follow-up.  We have discussed target BP range and blood pressure goal. I have advised patient to check BP regularly and to call us back or report to clinic if the numbers are consistently higher than 140/90. We discussed the importance of compliance with medical therapy and DASH diet recommended, consequences of uncontrolled hypertension discussed.   - continue current BP medications  2. Iron deficiency anemia  Continue iron  supplement Nutritional counseling done today.  Patient have been counseled extensively about nutrition and exercise  Return in about 4 weeks (around 11/14/2015) for Follow up HTN.  The patient was given clear instructions to go to ER or return to medical center if symptoms don't improve, worsen or new problems develop. The patient verbalized understanding. The patient was told to call to get lab results if they haven't heard anything in the next week.   This note has been created with Surveyor, quantity. Any transcriptional errors are unintentional.    Angelica Chessman, MD, [place], [place]arilyn Cota, [place] and [facility] [place], [place]   10/17/2015, 11:53 AM

## 2015-10-17 NOTE — Progress Notes (Signed)
Depression screen [facility] 2/9 10/17/2015 09/30/2015 10/14/2013  Decreased Interest 0 0 0  Down, Depressed, Hopeless 0 0 0  PHQ - 2 Score 0 0 0  PHQ9: 1(trouble falling or staying asleep, or sleeping too much)  GAD 7 : Generalized Anxiety Score 10/17/2015  Nervous, Anxious, on Edge 0  Control/stop worrying 0  Worry too much - different things 0  Trouble relaxing 0  Restless 0  Easily annoyed or irritable 0  Afraid - awful might happen 0  Total GAD 7 Score 0

## 2015-10-24 ENCOUNTER — Other Ambulatory Visit: Payer: Self-pay | Admitting: Internal Medicine

## 2015-11-21 ENCOUNTER — Encounter: Payer: Self-pay | Admitting: Internal Medicine

## 2015-11-21 ENCOUNTER — Ambulatory Visit: Payer: BLUE CROSS/BLUE SHIELD | Attending: Internal Medicine | Admitting: Internal Medicine

## 2015-11-21 VITALS — BP 190/129 | HR 69 | Temp 98.4°F | Resp 18 | Ht 63.0 in | Wt 159.3 lb

## 2015-11-21 DIAGNOSIS — M545 Low back pain: Secondary | ICD-10-CM | POA: Diagnosis not present

## 2015-11-21 DIAGNOSIS — Z79899 Other long term (current) drug therapy: Secondary | ICD-10-CM | POA: Diagnosis not present

## 2015-11-21 DIAGNOSIS — I10 Essential (primary) hypertension: Secondary | ICD-10-CM | POA: Insufficient documentation

## 2015-11-21 DIAGNOSIS — D509 Iron deficiency anemia, unspecified: Secondary | ICD-10-CM | POA: Diagnosis not present

## 2015-11-21 DIAGNOSIS — E785 Hyperlipidemia, unspecified: Secondary | ICD-10-CM

## 2015-11-21 DIAGNOSIS — M5431 Sciatica, right side: Secondary | ICD-10-CM | POA: Insufficient documentation

## 2015-11-21 DIAGNOSIS — M5441 Lumbago with sciatica, right side: Secondary | ICD-10-CM | POA: Diagnosis not present

## 2015-11-21 MED ORDER — PRAVASTATIN SODIUM 20 MG PO TABS: 20.0000 mg | ORAL_TABLET | Freq: Every day | ORAL | Status: DC

## 2015-11-21 MED ORDER — IBUPROFEN 600 MG PO TABS: 600.0000 mg | ORAL_TABLET | Freq: Three times a day (TID) | ORAL | Status: DC | PRN

## 2015-11-21 MED ORDER — LISINOPRIL-HYDROCHLOROTHIAZIDE 20-25 MG PO TABS: 1.0000 | ORAL_TABLET | Freq: Every day | ORAL | Status: DC

## 2015-11-21 NOTE — Patient Instructions (Signed)
Hypertension Hypertension, commonly called high blood pressure, is when the force of blood pumping through your arteries is too strong. Your arteries are the blood vessels that carry blood from your heart throughout your body. A blood pressure reading consists of a higher number over a lower number, such as 110/72. The higher number (systolic) is the pressure inside your arteries when your heart pumps. The lower number (diastolic) is the pressure inside your arteries when your heart relaxes. Ideally you want your blood pressure below 120/80. Hypertension forces your heart to work harder to pump blood. Your arteries may become narrow or stiff. Having untreated or uncontrolled hypertension can cause heart attack, stroke, kidney disease, and other problems. RISK FACTORS Some risk factors for high blood pressure are controllable. Others are not.  Risk factors you cannot control include:   Race. You may be at higher risk if you are African American.  Age. Risk increases with age.  Gender. Men are at higher risk than women before age 45 years. After age 65, women are at higher risk than men. Risk factors you can control include:  Not getting enough exercise or physical activity.  Being overweight.  Getting too much fat, sugar, calories, or salt in your diet.  Drinking too much alcohol. SIGNS AND SYMPTOMS Hypertension does not usually cause signs or symptoms. Extremely high blood pressure (hypertensive crisis) may cause headache, anxiety, shortness of breath, and nosebleed. DIAGNOSIS To check if you have hypertension, your health care provider will measure your blood pressure while you are seated, with your arm held at the level of your heart. It should be measured at least twice using the same arm. Certain conditions can cause a difference in blood pressure between your right and left arms. A blood pressure reading that is higher than normal on one occasion does not mean that you need treatment. If  it is not clear whether you have high blood pressure, you may be asked to return on a different day to have your blood pressure checked again. Or, you may be asked to monitor your blood pressure at home for 1 or more weeks. TREATMENT Treating high blood pressure includes making lifestyle changes and possibly taking medicine. Living a healthy lifestyle can help lower high blood pressure. You may need to change some of your habits. Lifestyle changes may include:  Following the DASH diet. This diet is high in fruits, vegetables, and whole grains. It is low in salt, red meat, and added sugars.  Keep your sodium intake below 2,300 mg per day.  Getting at least 30-45 minutes of aerobic exercise at least 4 times per week.  Losing weight if necessary.  Not smoking.  Limiting alcoholic beverages.  Learning ways to reduce stress. Your health care provider may prescribe medicine if lifestyle changes are not enough to get your blood pressure under control, and if one of the following is true:  You are 18-59 years of age and your systolic blood pressure is above 140.  You are 60 years of age or older, and your systolic blood pressure is above 150.  Your diastolic blood pressure is above 90.  You have diabetes, and your systolic blood pressure is over 140 or your diastolic blood pressure is over 90.  You have kidney disease and your blood pressure is above 140/90.  You have heart disease and your blood pressure is above 140/90. Your personal target blood pressure may vary depending on your medical conditions, your age, and other factors. HOME CARE INSTRUCTIONS    Have your blood pressure rechecked as directed by your health care provider.   Take medicines only as directed by your health care provider. Follow the directions carefully. Blood pressure medicines must be taken as prescribed. The medicine does not work as well when you skip doses. Skipping doses also puts you at risk for  problems.  Do not smoke.   Monitor your blood pressure at home as directed by your health care provider. SEEK MEDICAL CARE IF:   You think you are having a reaction to medicines taken.  You have recurrent headaches or feel dizzy.  You have swelling in your ankles.  You have trouble with your vision. SEEK IMMEDIATE MEDICAL CARE IF:  You develop a severe headache or confusion.  You have unusual weakness, numbness, or feel faint.  You have severe chest or abdominal pain.  You vomit repeatedly.  You have trouble breathing. MAKE SURE YOU:   Understand these instructions.  Will watch your condition.  Will get help right away if you are not doing well or get worse.   This information is not intended to replace advice given to you by your health care provider. Make sure you discuss any questions you have with your health care provider.   Document Released: 07/13/2005 Document Revised: 11/27/2014 Document Reviewed: 05/05/2013 Elsevier Interactive Patient Education 2016 Elsevier Inc.  

## 2015-11-21 NOTE — Progress Notes (Signed)
Patient is here for HTN  Patient complains of neck pain with turning. Pain has been present for 1 week. Pain is scaled at a 6 currently.  Patient has taken medication and has eaten at 4am.

## 2015-11-21 NOTE — Progress Notes (Signed)
Patient ID: Megan Baxter, female   DOB: [birth year], 45 y.o.   MRN: [id number]   Wendee Gatchell, is a 45 y.o. female  [id number]  [id number]  DOB - Mar 21, 1971  Chief Complaint  Patient presents with  . Follow-up    HTN        Subjective:   Megan Baxter is a 45 y.o. female with history of hypertension, dyslipidemia and chronic iron deficiency anemia here today for a follow up visit and medication management. Patient is not compliant with medication, has not started hydrochlorothiazide, or can only take medications in the morning before she starts her fasting. She is only on labetalol 300 mg tablet by mouth 2 times daily. Blood pressure today is uncontrolled. She is here today with her husband and children, she complains of generalized body pain but mostly her back and neck, she is presently nursing 2 babies, lots of domestic stress. She denies depression but she is asking for medication to help her sleep at night. She denies any chest pain, no shortness of breath, no history of trauma or fall. Patient has No headache, No abdominal pain - No Nausea or vomiting, No new weakness tingling or numbness, No Cough - SOB.  Problem  Uncontrolled Hypertension  Midline Low Back Pain With Right-Sided Sciatica    ALLERGIES: No Known Allergies  PAST MEDICAL HISTORY: Past Medical History  Diagnosis Date  . Hypertension   . Heartburn in pregnancy   . Maternal anemia in pregnancy, antepartum 02/18/2015    MEDICATIONS AT HOME: Prior to Admission medications   Medication Sig Start Date End Date Taking? Authorizing Provider  ibuprofen (ADVIL,MOTRIN) 600 MG tablet Take 1 tablet (600 mg total) by mouth every 8 (eight) hours as needed for mild pain. 11/21/15  Yes Tresa Garter, MD  labetalol (NORMODYNE) 300 MG tablet Take 1 tablet (300 mg total) by mouth 2 (two) times daily. 09/30/15  Yes Tresa Garter, MD  ferrous sulfate 325 (65 FE) MG tablet Take 1 tablet (325 mg total) by mouth 2  (two) times daily with a meal. Patient not taking: Reported on 11/21/2015 09/30/15   Tresa Garter, MD  lisinopril-hydrochlorothiazide (PRINZIDE,ZESTORETIC) 20-25 MG tablet Take 1 tablet by mouth daily. 11/21/15   Tresa Garter, MD  oxyCODONE-acetaminophen (PERCOCET/ROXICET) 5-325 MG tablet Take 1 tablet by mouth every 4 (four) hours as needed (for pain scale 4-7). Patient not taking: Reported on 09/30/2015 05/27/15   Venus Standard, CNM  pravastatin (PRAVACHOL) 20 MG tablet Take 1 tablet (20 mg total) by mouth daily. 11/21/15   Tresa Garter, MD  Prenatal Vit-Fe Fumarate-FA (PRENATAL MULTIVITAMIN) TABS tablet Take 1 tablet by mouth daily at 12 noon. Patient not taking: Reported on 09/30/2015 05/27/15   Venus Standard, CNM     Objective:   Filed Vitals:   11/21/15 0937  BP: 190/129  Pulse: 69  Temp: 98.4 F (36.9 C)  TempSrc: Oral  Resp: 18  Height: 5\' 3"  (1.6 m)  Weight: 159 lb 4.8 oz (72.258 kg)  SpO2: 100%    Exam General appearance : Awake, alert, not in any distress. Speech Clear. Not toxic looking HEENT: Atraumatic and Normocephalic, pupils equally reactive to light and accomodation Neck: supple, no JVD. No cervical lymphadenopathy.  Chest:Good air entry bilaterally, no added sounds  CVS: S1 S2 regular, no murmurs.  Abdomen: Bowel sounds present, Non tender and not distended with no gaurding, rigidity or rebound. Extremities: B/L Lower Ext shows no edema, both legs are warm to touch Neurology:  Awake alert, and oriented X 3, CN II-XII intact, Non focal Skin: No Rash  Data Review No results found for: HGBA1C   Assessment & Plan   1. Uncontrolled hypertension: Patient only taking Labetalol Continue labetalol 300 mg tablet by mouth twice a day Add - lisinopril-hydrochlorothiazide (PRINZIDE,ZESTORETIC) 20-25 MG tablet; Take 1 tablet by mouth daily.  Dispense: 90 tablet; Refill: 3 Return in 2 weeks for blood pressure check and medication management  We have  discussed target BP range and blood pressure goal. I have advised patient to check BP regularly and to call us back or report to clinic if the numbers are consistently higher than 140/90. We discussed the importance of compliance with medical therapy and DASH diet recommended, consequences of uncontrolled hypertension discussed.   - continue current BP medications  2. Midline low back pain with right-sided sciatica  - ibuprofen (ADVIL,MOTRIN) 600 MG tablet; Take 1 tablet (600 mg total) by mouth every 8 (eight) hours as needed for mild pain.  Dispense: 60 tablet; Refill: 3  3. Dyslipidemia  - pravastatin (PRAVACHOL) 20 MG tablet; Take 1 tablet (20 mg total) by mouth daily.  Dispense: 90 tablet; Refill: 3  To address this please limit saturated fat to no more than 7% of your calories, limit cholesterol to 200 mg/day, increase fiber and exercise as tolerated. If needed we may add another cholesterol lowering medication to your regimen.   Patient have been counseled extensively about nutrition and exercise  Return in about 2 weeks (around 12/05/2015) for Follow up HTN.  The patient was given clear instructions to go to ER or return to medical center if symptoms don't improve, worsen or new problems develop. The patient verbalized understanding. The patient was told to call to get lab results if they haven't heard anything in the next week.   This note has been created with Surveyor, quantity. Any transcriptional errors are unintentional.    Angelica Chessman, MD, [place], [place], [place], [place] and [facility] [place], [place]   11/21/2015, 10:14 AM

## 2015-12-05 ENCOUNTER — Encounter: Payer: Self-pay | Admitting: Internal Medicine

## 2015-12-05 ENCOUNTER — Ambulatory Visit: Payer: BLUE CROSS/BLUE SHIELD | Attending: Internal Medicine | Admitting: Internal Medicine

## 2015-12-05 VITALS — BP 140/85 | HR 81 | Temp 98.8°F | Resp 18 | Ht 63.0 in | Wt 159.0 lb

## 2015-12-05 DIAGNOSIS — Z79899 Other long term (current) drug therapy: Secondary | ICD-10-CM | POA: Insufficient documentation

## 2015-12-05 DIAGNOSIS — E785 Hyperlipidemia, unspecified: Secondary | ICD-10-CM | POA: Diagnosis not present

## 2015-12-05 DIAGNOSIS — I10 Essential (primary) hypertension: Secondary | ICD-10-CM | POA: Diagnosis present

## 2015-12-05 NOTE — Patient Instructions (Signed)
Hypertension Hypertension, commonly called high blood pressure, is when the force of blood pumping through your arteries is too strong. Your arteries are the blood vessels that carry blood from your heart throughout your body. A blood pressure reading consists of a higher number over a lower number, such as 110/72. The higher number (systolic) is the pressure inside your arteries when your heart pumps. The lower number (diastolic) is the pressure inside your arteries when your heart relaxes. Ideally you want your blood pressure below 120/80. Hypertension forces your heart to work harder to pump blood. Your arteries may become narrow or stiff. Having untreated or uncontrolled hypertension can cause heart attack, stroke, kidney disease, and other problems. RISK FACTORS Some risk factors for high blood pressure are controllable. Others are not.  Risk factors you cannot control include:   Race. You may be at higher risk if you are African American.  Age. Risk increases with age.  Gender. Men are at higher risk than women before age 45 years. After age 65, women are at higher risk than men. Risk factors you can control include:  Not getting enough exercise or physical activity.  Being overweight.  Getting too much fat, sugar, calories, or salt in your diet.  Drinking too much alcohol. SIGNS AND SYMPTOMS Hypertension does not usually cause signs or symptoms. Extremely high blood pressure (hypertensive crisis) may cause headache, anxiety, shortness of breath, and nosebleed. DIAGNOSIS To check if you have hypertension, your health care provider will measure your blood pressure while you are seated, with your arm held at the level of your heart. It should be measured at least twice using the same arm. Certain conditions can cause a difference in blood pressure between your right and left arms. A blood pressure reading that is higher than normal on one occasion does not mean that you need treatment. If  it is not clear whether you have high blood pressure, you may be asked to return on a different day to have your blood pressure checked again. Or, you may be asked to monitor your blood pressure at home for 1 or more weeks. TREATMENT Treating high blood pressure includes making lifestyle changes and possibly taking medicine. Living a healthy lifestyle can help lower high blood pressure. You may need to change some of your habits. Lifestyle changes may include:  Following the DASH diet. This diet is high in fruits, vegetables, and whole grains. It is low in salt, red meat, and added sugars.  Keep your sodium intake below 2,300 mg per day.  Getting at least 30-45 minutes of aerobic exercise at least 4 times per week.  Losing weight if necessary.  Not smoking.  Limiting alcoholic beverages.  Learning ways to reduce stress. Your health care provider may prescribe medicine if lifestyle changes are not enough to get your blood pressure under control, and if one of the following is true:  You are 18-59 years of age and your systolic blood pressure is above 140.  You are 60 years of age or older, and your systolic blood pressure is above 150.  Your diastolic blood pressure is above 90.  You have diabetes, and your systolic blood pressure is over 140 or your diastolic blood pressure is over 90.  You have kidney disease and your blood pressure is above 140/90.  You have heart disease and your blood pressure is above 140/90. Your personal target blood pressure may vary depending on your medical conditions, your age, and other factors. HOME CARE INSTRUCTIONS    Have your blood pressure rechecked as directed by your health care provider.   Take medicines only as directed by your health care provider. Follow the directions carefully. Blood pressure medicines must be taken as prescribed. The medicine does not work as well when you skip doses. Skipping doses also puts you at risk for  problems.  Do not smoke.   Monitor your blood pressure at home as directed by your health care provider. SEEK MEDICAL CARE IF:   You think you are having a reaction to medicines taken.  You have recurrent headaches or feel dizzy.  You have swelling in your ankles.  You have trouble with your vision. SEEK IMMEDIATE MEDICAL CARE IF:  You develop a severe headache or confusion.  You have unusual weakness, numbness, or feel faint.  You have severe chest or abdominal pain.  You vomit repeatedly.  You have trouble breathing. MAKE SURE YOU:   Understand these instructions.  Will watch your condition.  Will get help right away if you are not doing well or get worse.   This information is not intended to replace advice given to you by your health care provider. Make sure you discuss any questions you have with your health care provider.   Document Released: 07/13/2005 Document Revised: 11/27/2014 Document Reviewed: 05/05/2013 Elsevier Interactive Patient Education 2016 Elsevier Inc.  

## 2015-12-05 NOTE — Progress Notes (Signed)
Patient ID: Megan Baxter, female   DOB: [birth year], 45 y.o.   MRN: [id number]   Megan Baxter, is a 45 y.o. female  [id number]  [id number]  DOB - 07/29/1970  No chief complaint on file.       Subjective:   Megan Baxter is a 45 y.o. female with history of uncontrolled HTN here today for a follow up visit and medication management. She recently had modification of her BP meds and instructed to follow up today for BP check and meds management. She has no new complaint. She now has a BP machine for home monitoring. She currently takes Lisinopril/HCTZ 20/25 mg PO daily and Labetalol 300 mg PO BiD. She is compliant except when fasting (religious), she reports no dizziness, no chest pain, no headache, no change in vision. Patient has no abdominal pain - No Nausea, No new weakness tingling or numbness, No Cough - SOB.  No problems updated.  ALLERGIES: No Known Allergies  PAST MEDICAL HISTORY: Past Medical History  Diagnosis Date  . Hypertension   . Heartburn in pregnancy   . Maternal anemia in pregnancy, antepartum 02/18/2015    MEDICATIONS AT HOME: Prior to Admission medications   Medication Sig Start Date End Date Taking? Authorizing Provider  ferrous sulfate 325 (65 FE) MG tablet Take 1 tablet (325 mg total) by mouth 2 (two) times daily with a meal. Patient not taking: Reported on 11/21/2015 09/30/15   Tresa Garter, MD  ibuprofen (ADVIL,MOTRIN) 600 MG tablet Take 1 tablet (600 mg total) by mouth every 8 (eight) hours as needed for mild pain. 11/21/15   Tresa Garter, MD  labetalol (NORMODYNE) 300 MG tablet Take 1 tablet (300 mg total) by mouth 2 (two) times daily. 09/30/15   Tresa Garter, MD  lisinopril-hydrochlorothiazide (PRINZIDE,ZESTORETIC) 20-25 MG tablet Take 1 tablet by mouth daily. 11/21/15   Tresa Garter, MD  oxyCODONE-acetaminophen (PERCOCET/ROXICET) 5-325 MG tablet Take 1 tablet by mouth every 4 (four) hours as needed (for pain scale  4-7). Patient not taking: Reported on 09/30/2015 05/27/15   Venus Standard, CNM  pravastatin (PRAVACHOL) 20 MG tablet Take 1 tablet (20 mg total) by mouth daily. 11/21/15   Tresa Garter, MD  Prenatal Vit-Fe Fumarate-FA (PRENATAL MULTIVITAMIN) TABS tablet Take 1 tablet by mouth daily at 12 noon. Patient not taking: Reported on 09/30/2015 05/27/15   Venus Standard, CNM     Objective:   BP = 140/85 mmHg Exam General appearance : Awake, alert, not in any distress. Speech Clear. Not toxic looking HEENT: Atraumatic and Normocephalic, pupils equally reactive to light and accomodation Neck: supple, no JVD. No cervical lymphadenopathy.  Chest:Good air entry bilaterally, no added sounds  CVS: S1 S2 regular, no murmurs.  Abdomen: Bowel sounds present, Non tender and not distended with no gaurding, rigidity or rebound. Extremities: B/L Lower Ext shows no edema, both legs are warm to touch Neurology: Awake alert, and oriented X 3, CN II-XII intact, Non focal Skin:No Rash  Data Review No results found for: HGBA1C   Assessment & Plan   1. Uncontrolled hypertension   BP is better controlled today, continue current regimen, check BP at home and bring BP log again for review during next visit.  We have discussed target BP range and blood pressure goal. I have advised patient to check BP regularly and to call us back or report to clinic if the numbers are consistently higher than 140/90. We discussed the importance of compliance with medical therapy and DASH diet  recommended, consequences of uncontrolled hypertension discussed.   - continue current BP medications  2. Dyslipidemia  To address this please limit saturated fat to no more than 7% of your calories, limit cholesterol to 200 mg/day, increase fiber and exercise as tolerated. If needed we may add another cholesterol lowering medication to your regimen.   Patient have been counseled extensively about nutrition and exercise  Return in  about 2 months (around 02/04/2016) for Follow up HTN.  Interpreter was used to communicate directly with patient for the entire encounter including providing detailed patient instructions.   The patient was given clear instructions to go to ER or return to medical center if symptoms don't improve, worsen or new problems develop. The patient verbalized understanding. The patient was told to call to get lab results if they haven't heard anything in the next week.   This note has been created with Surveyor, quantity. Any transcriptional errors are unintentional.    Angelica Chessman, MD, [place], [place], [place], [place] and [place], [place]   12/05/2015, 12:20 PM

## 2015-12-05 NOTE — Progress Notes (Signed)
Patient is here for FU HTN  Patient denies pain at this time.  Patient has taken medication today and patient ate this morning.

## 2016-01-17 ENCOUNTER — Other Ambulatory Visit: Payer: Self-pay | Admitting: Pharmacist

## 2016-01-17 DIAGNOSIS — I10 Essential (primary) hypertension: Secondary | ICD-10-CM

## 2016-01-17 DIAGNOSIS — M5441 Lumbago with sciatica, right side: Secondary | ICD-10-CM

## 2016-01-17 DIAGNOSIS — E785 Hyperlipidemia, unspecified: Secondary | ICD-10-CM

## 2016-01-17 MED ORDER — IBUPROFEN 600 MG PO TABS: 600.0000 mg | ORAL_TABLET | Freq: Three times a day (TID) | ORAL | Status: DC | PRN

## 2016-01-17 MED ORDER — LABETALOL HCL 300 MG PO TABS: 300.0000 mg | ORAL_TABLET | Freq: Two times a day (BID) | ORAL | Status: DC

## 2016-01-17 MED ORDER — PRAVASTATIN SODIUM 20 MG PO TABS: 20.0000 mg | ORAL_TABLET | Freq: Every day | ORAL | Status: DC

## 2016-01-17 MED ORDER — LISINOPRIL-HYDROCHLOROTHIAZIDE 20-25 MG PO TABS: 1.0000 | ORAL_TABLET | Freq: Every day | ORAL | Status: DC

## 2016-01-29 ENCOUNTER — Other Ambulatory Visit: Payer: Self-pay | Admitting: Internal Medicine

## 2016-01-29 MED FILL — HYDROCHLOROTHIAZIDE 25 MG T: 25 | 30 days supply | Qty: 30 | Fill #1

## 2016-01-29 MED FILL — FERROUS SULFATE 325 MG TAB: 325 (65 FE) | 30 days supply | Qty: 60 | Fill #1

## 2016-02-13 ENCOUNTER — Encounter: Payer: Self-pay | Admitting: Internal Medicine

## 2016-02-13 ENCOUNTER — Ambulatory Visit: Payer: BLUE CROSS/BLUE SHIELD | Attending: Internal Medicine | Admitting: Internal Medicine

## 2016-02-13 DIAGNOSIS — I10 Essential (primary) hypertension: Secondary | ICD-10-CM | POA: Diagnosis not present

## 2016-02-13 DIAGNOSIS — E785 Hyperlipidemia, unspecified: Secondary | ICD-10-CM

## 2016-02-13 MED ORDER — PRAVASTATIN SODIUM 20 MG PO TABS: 20.0000 mg | ORAL_TABLET | Freq: Every day | ORAL | Status: DC

## 2016-02-13 MED ORDER — LABETALOL HCL 300 MG PO TABS: 300.0000 mg | ORAL_TABLET | Freq: Two times a day (BID) | ORAL | Status: DC

## 2016-02-13 MED ORDER — LISINOPRIL-HYDROCHLOROTHIAZIDE 20-25 MG PO TABS: 1.0000 | ORAL_TABLET | Freq: Every day | ORAL | Status: DC

## 2016-02-13 MED FILL — LABETALOL HCL 300 MG TABLET: 300 | 30 days supply | Qty: 60 | Fill #0

## 2016-02-13 MED FILL — LISINOPRIL-HCTZ 20-25 MG TA: 20-25 | 30 days supply | Qty: 30 | Fill #0

## 2016-02-13 MED FILL — PRAVASTATIN NA 20 MG TAB: 20 | 30 days supply | Qty: 30 | Fill #0

## 2016-02-13 NOTE — Patient Instructions (Signed)
Hypertension Hypertension, commonly called high blood pressure, is when the force of blood pumping through your arteries is too strong. Your arteries are the blood vessels that carry blood from your heart throughout your body. A blood pressure reading consists of a higher number over a lower number, such as 110/72. The higher number (systolic) is the pressure inside your arteries when your heart pumps. The lower number (diastolic) is the pressure inside your arteries when your heart relaxes. Ideally you want your blood pressure below 120/80. Hypertension forces your heart to work harder to pump blood. Your arteries may become narrow or stiff. Having untreated or uncontrolled hypertension can cause heart attack, stroke, kidney disease, and other problems. RISK FACTORS Some risk factors for high blood pressure are controllable. Others are not.  Risk factors you cannot control include:   Race. You may be at higher risk if you are African American.  Age. Risk increases with age.  Gender. Men are at higher risk than women before age 45 years. After age 65, women are at higher risk than men. Risk factors you can control include:  Not getting enough exercise or physical activity.  Being overweight.  Getting too much fat, sugar, calories, or salt in your diet.  Drinking too much alcohol. SIGNS AND SYMPTOMS Hypertension does not usually cause signs or symptoms. Extremely high blood pressure (hypertensive crisis) may cause headache, anxiety, shortness of breath, and nosebleed. DIAGNOSIS To check if you have hypertension, your health care provider will measure your blood pressure while you are seated, with your arm held at the level of your heart. It should be measured at least twice using the same arm. Certain conditions can cause a difference in blood pressure between your right and left arms. A blood pressure reading that is higher than normal on one occasion does not mean that you need treatment. If  it is not clear whether you have high blood pressure, you may be asked to return on a different day to have your blood pressure checked again. Or, you may be asked to monitor your blood pressure at home for 1 or more weeks. TREATMENT Treating high blood pressure includes making lifestyle changes and possibly taking medicine. Living a healthy lifestyle can help lower high blood pressure. You may need to change some of your habits. Lifestyle changes may include:  Following the DASH diet. This diet is high in fruits, vegetables, and whole grains. It is low in salt, red meat, and added sugars.  Keep your sodium intake below 2,300 mg per day.  Getting at least 30-45 minutes of aerobic exercise at least 4 times per week.  Losing weight if necessary.  Not smoking.  Limiting alcoholic beverages.  Learning ways to reduce stress. Your health care provider may prescribe medicine if lifestyle changes are not enough to get your blood pressure under control, and if one of the following is true:  You are 18-59 years of age and your systolic blood pressure is above 140.  You are 60 years of age or older, and your systolic blood pressure is above 150.  Your diastolic blood pressure is above 90.  You have diabetes, and your systolic blood pressure is over 140 or your diastolic blood pressure is over 90.  You have kidney disease and your blood pressure is above 140/90.  You have heart disease and your blood pressure is above 140/90. Your personal target blood pressure may vary depending on your medical conditions, your age, and other factors. HOME CARE INSTRUCTIONS    Have your blood pressure rechecked as directed by your health care provider.   Take medicines only as directed by your health care provider. Follow the directions carefully. Blood pressure medicines must be taken as prescribed. The medicine does not work as well when you skip doses. Skipping doses also puts you at risk for  problems.  Do not smoke.   Monitor your blood pressure at home as directed by your health care provider. SEEK MEDICAL CARE IF:   You think you are having a reaction to medicines taken.  You have recurrent headaches or feel dizzy.  You have swelling in your ankles.  You have trouble with your vision. SEEK IMMEDIATE MEDICAL CARE IF:  You develop a severe headache or confusion.  You have unusual weakness, numbness, or feel faint.  You have severe chest or abdominal pain.  You vomit repeatedly.  You have trouble breathing. MAKE SURE YOU:   Understand these instructions.  Will watch your condition.  Will get help right away if you are not doing well or get worse.   This information is not intended to replace advice given to you by your health care provider. Make sure you discuss any questions you have with your health care provider.   Document Released: 07/13/2005 Document Revised: 11/27/2014 Document Reviewed: 05/05/2013 Elsevier Interactive Patient Education 2016 Elsevier Inc.  

## 2016-02-13 NOTE — Progress Notes (Signed)
Patient ID: Megan Baxter, female   DOB: 1970/09/20, 45 y.o.   MRN: [id number]   Megan Baxter, is a 45 y.o. female  [id number]  [id number]  DOB - 10/23/1970  Chief Complaint  Patient presents with  . Follow-up    Hypertension        Subjective:   Megan Baxter is a 45 y.o. female with history of hypertension here today for a follow up visit and medication management. Patient has not been taking her blood pressure medications as prescribed, she is only taking hydrochlorothiazide 25 mg tablet by mouth daily instead of lisinopril-hydrochlorothiazide 20/25 mg tablet by mouth daily and she is not taking the prescribed labetalol 300 mg tablet by mouth twice a day. Patient claims she thought her medications while at a different pharmacy and has been on that pharmacy for refill but did not get any answer. She has no complaint today. She denies any visual impairment. Patient has No headache, No chest pain, No abdominal pain - No Nausea, No new weakness tingling or numbness, No Cough - SOB.  No problems updated.  ALLERGIES: No Known Allergies  PAST MEDICAL HISTORY: Past Medical History  Diagnosis Date  . Hypertension   . Heartburn in pregnancy   . Maternal anemia in pregnancy, antepartum 02/18/2015    MEDICATIONS AT HOME: Prior to Admission medications   Medication Sig Start Date End Date Taking? Authorizing Provider  ferrous sulfate 325 (65 FE) MG tablet Take 1 tablet (325 mg total) by mouth 2 (two) times daily with a meal. 09/30/15  Yes Triston Lisanti E Doreene Burke, MD  labetalol (NORMODYNE) 300 MG tablet Take 1 tablet (300 mg total) by mouth 2 (two) times daily. 02/13/16   Tresa Garter, MD  lisinopril-hydrochlorothiazide (PRINZIDE,ZESTORETIC) 20-25 MG tablet Take 1 tablet by mouth daily. 02/13/16   Tresa Garter, MD  pravastatin (PRAVACHOL) 20 MG tablet Take 1 tablet (20 mg total) by mouth daily. 02/13/16   Tresa Garter, MD  Prenatal Vit-Fe Fumarate-FA (PRENATAL  MULTIVITAMIN) TABS tablet Take 1 tablet by mouth daily at 12 noon. Patient not taking: Reported on 09/30/2015 05/27/15   Venus Standard, CNM     Objective:   Filed Vitals:   02/13/16 1122 02/13/16 1125  BP: 210/136 166/105  Pulse: 92 96  Temp: 99.1 F (37.3 C)   TempSrc: Oral   Weight: 161 lb 9.6 oz (73.301 kg)     Exam General appearance : Awake, alert, not in any distress. Speech Clear. Not toxic looking HEENT: Atraumatic and Normocephalic, pupils equally reactive to light and accomodation Neck: Supple, no JVD. No cervical lymphadenopathy.  Chest: Good air entry bilaterally, no added sounds  CVS: S1 S2 regular, no murmurs.  Abdomen: Bowel sounds present, Non tender and not distended with no gaurding, rigidity or rebound. Extremities: B/L Lower Ext shows no edema, both legs are warm to touch Neurology: Awake alert, and oriented X 3, CN II-XII intact, Non focal Skin: No Rash  Data Review No results found for: HGBA1C   Assessment & Plan   1. Uncontrolled hypertension: Patient has not been taking the right blood pressure medications, even though we changed her medication from hydrochlorothiazide 25 mg tablet by mouth daily to lisinopril-hydrochlorothiazide 20-25 milligrams tablet by mouth daily, she continues to take the hydrochlorothiazide only medication, has not filled the prescription for labetalol. Patient is not taking pravastatin either.  Refill: - labetalol (NORMODYNE) 300 MG tablet; Take 1 tablet (300 mg total) by mouth 2 (two) times daily.  Dispense: 180 tablet;  Refill: 3 - lisinopril-hydrochlorothiazide (PRINZIDE,ZESTORETIC) 20-25 MG tablet; Take 1 tablet by mouth daily.  Dispense: 90 tablet; Refill: 3  We have discussed target BP range and blood pressure goal. I have advised patient to check BP regularly and to call us back or report to clinic if the numbers are consistently higher than 140/90. We discussed the importance of compliance with medical therapy and DASH  diet recommended, consequences of uncontrolled hypertension discussed.  - continue current BP medications  2. Dyslipidemia  - pravastatin (PRAVACHOL) 20 MG tablet; Take 1 tablet (20 mg total) by mouth daily.  Dispense: 90 tablet; Refill: 3  To address this please limit saturated fat to no more than 7% of your calories, limit cholesterol to 200 mg/day, increase fiber and exercise as tolerated. If needed we may add another cholesterol lowering medication to your regimen.   Patient have been counseled extensively about nutrition and exercise  Return in about 2 weeks (around 02/27/2016) for CPP [facility] for BP/CBG and DM management.  The patient was given clear instructions to go to ER or return to medical center if symptoms don't improve, worsen or new problems develop. The patient verbalized understanding. The patient was told to call to get lab results if they haven't heard anything in the next week.   This note has been created with Surveyor, quantity. Any transcriptional errors are unintentional.    Angelica Chessman, MD, [place], [place], [place], [place] and [facility] [place], [place]   02/13/2016, 11:45 AM

## 2016-03-05 ENCOUNTER — Encounter: Payer: Self-pay | Admitting: Pharmacist

## 2016-03-05 ENCOUNTER — Ambulatory Visit: Payer: BLUE CROSS/BLUE SHIELD | Attending: Internal Medicine | Admitting: Pharmacist

## 2016-03-05 VITALS — BP 126/80 | HR 76

## 2016-03-05 DIAGNOSIS — I10 Essential (primary) hypertension: Secondary | ICD-10-CM | POA: Diagnosis present

## 2016-03-05 NOTE — Progress Notes (Signed)
   S:    Patient arrives in good spirits with her son and husband, who interprets for her - they refused a third party interpreter.    Presents to the clinic for hypertension evaluation. Patient was referred on 02/13/16.  Patient was last seen by Primary Care Provider on 02/13/16.   Patient reports adherence with medications.  Current BP Medications include:  Labetalol 300 mg BID and lisinopril/HCTZ 20-25 mg daily.   Antihypertensives tried in the past include: none  Patient is not breastfeeding her son anymore.   O:   Last 3 Office BP readings: BP Readings from Last 3 Encounters:  03/05/16 126/80  02/13/16 (!) 166/105  12/05/15 140/85    BMET    Component Value Date/Time   NA 135 05/23/2015 1520   K 3.6 05/23/2015 1520   CL 107 05/23/2015 1520   CO2 22 05/23/2015 1520   GLUCOSE 80 05/23/2015 1520   BUN 11 05/23/2015 1520   CREATININE 0.48 05/23/2015 1520   CREATININE 0.79 05/03/2014 1000   CALCIUM 9.4 05/23/2015 1520   GFRNONAA >60 05/23/2015 1520   GFRNONAA >89 05/03/2014 1000   GFRAA >60 05/23/2015 1520   GFRAA >89 05/03/2014 1000    A/P: Hypertension longstanding/newly diagnosed currently controlled on current medications.  Continued all medications as prescribed. Will need updated BMET at next visit. Informed patient to let us know if she is every pregnant again as we would have to change some of her medications. Patient verbalized understanding.    Results reviewed and written information provided.   Total time in face-to-face counseling 20 minutes.   F/U Clinic Visit with Dr. Doreene Burke.  Patient seen with Shellee Milo, PharmD Candidate

## 2016-03-05 NOTE — Patient Instructions (Addendum)
Thanks for coming to see [place]!  Continue all medications as prescribed  Ask the pharmacy for refills.  Follow up with Dr. Doreene Burke.   Hypertension Hypertension is another name for high blood pressure. High blood pressure forces your heart to work harder to pump blood. A blood pressure reading has two numbers, which includes a higher number over a lower number (example: 110/72). HOME CARE   Have your blood pressure rechecked by your doctor.  Only take medicine as told by your doctor. Follow the directions carefully. The medicine does not work as well if you skip doses. Skipping doses also puts you at risk for problems.  Do not smoke.  Monitor your blood pressure at home as told by your doctor. GET HELP IF:  You think you are having a reaction to the medicine you are taking.  You have repeat headaches or feel dizzy.  You have puffiness (swelling) in your ankles.  You have trouble with your vision. GET HELP RIGHT AWAY IF:   You get a very bad headache and are confused.  You feel weak, numb, or faint.  You get chest or belly (abdominal) pain.  You throw up (vomit).  You cannot breathe very well. MAKE SURE YOU:   Understand these instructions.  Will watch your condition.  Will get help right away if you are not doing well or get worse.   This information is not intended to replace advice given to you by your health care provider. Make sure you discuss any questions you have with your health care provider.   Document Released: 12/30/2007 Document Revised: 07/18/2013 Document Reviewed: 05/05/2013 Elsevier Interactive Patient Education [employer].

## 2016-03-17 MED FILL — FERROUS SULFATE 325 MG TAB: 325 (65 FE) | 30 days supply | Qty: 60 | Fill #2

## 2016-04-13 MED FILL — LABETALOL HCL 300 MG TABLET: 300 | 30 days supply | Qty: 60 | Fill #1

## 2016-05-14 ENCOUNTER — Other Ambulatory Visit: Payer: Self-pay

## 2016-05-14 ENCOUNTER — Ambulatory Visit: Payer: BLUE CROSS/BLUE SHIELD | Attending: Internal Medicine | Admitting: Internal Medicine

## 2016-05-14 ENCOUNTER — Encounter: Payer: Self-pay | Admitting: Internal Medicine

## 2016-05-14 VITALS — BP 153/104 | HR 74 | Temp 99.0°F | Resp 16 | Wt 167.2 lb

## 2016-05-14 DIAGNOSIS — E785 Hyperlipidemia, unspecified: Secondary | ICD-10-CM | POA: Insufficient documentation

## 2016-05-14 DIAGNOSIS — I10 Essential (primary) hypertension: Secondary | ICD-10-CM | POA: Diagnosis not present

## 2016-05-14 DIAGNOSIS — Z1231 Encounter for screening mammogram for malignant neoplasm of breast: Secondary | ICD-10-CM

## 2016-05-14 DIAGNOSIS — R1012 Left upper quadrant pain: Secondary | ICD-10-CM | POA: Diagnosis not present

## 2016-05-14 DIAGNOSIS — Z1239 Encounter for other screening for malignant neoplasm of breast: Secondary | ICD-10-CM

## 2016-05-14 DIAGNOSIS — D508 Other iron deficiency anemias: Secondary | ICD-10-CM

## 2016-05-14 DIAGNOSIS — M546 Pain in thoracic spine: Secondary | ICD-10-CM

## 2016-05-14 MED ORDER — PANTOPRAZOLE SODIUM 40 MG PO TBEC: 40.0000 mg | DELAYED_RELEASE_TABLET | Freq: Every day | ORAL | 3 refills | Status: DC

## 2016-05-14 MED ORDER — LISINOPRIL-HYDROCHLOROTHIAZIDE 20-25 MG PO TABS: 1.0000 | ORAL_TABLET | Freq: Every day | ORAL | 3 refills | Status: DC

## 2016-05-14 MED ORDER — PRAVASTATIN SODIUM 20 MG PO TABS: 20.0000 mg | ORAL_TABLET | Freq: Every day | ORAL | 3 refills | Status: DC

## 2016-05-14 MED ORDER — ACETAMINOPHEN-CODEINE #3 300-30 MG PO TABS: 1.0000 | ORAL_TABLET | ORAL | 0 refills | Status: DC | PRN

## 2016-05-14 MED ORDER — LABETALOL HCL 300 MG PO TABS: 300.0000 mg | ORAL_TABLET | Freq: Two times a day (BID) | ORAL | 3 refills | Status: DC

## 2016-05-14 MED ORDER — FERROUS SULFATE 325 (65 FE) MG PO TABS: 325.0000 mg | ORAL_TABLET | Freq: Two times a day (BID) | ORAL | 3 refills | Status: DC

## 2016-05-14 NOTE — Progress Notes (Signed)
Pt is in the office today for back pain Pt states her pain level today in the office is a 10 Pt states her pain comes and goes

## 2016-05-14 NOTE — Patient Instructions (Signed)

## 2016-05-14 NOTE — Progress Notes (Signed)
Megan Baxter, is a 45 y.o. female  [id number]  [id number]  DOB - March 08, 1971  Chief Complaint  Patient presents with  . Back Pain      Subjective:   Megan Baxter is a 45 y.o. female with history of hypertension and chronic back pain here today for a follow up visit. Patient is complaining of on-going severe mid-back pain, ongoing for at least 3 years but getting worse. No urinary or fecal incontinence, no lower limb weakness or numbness. Pain radiates from mid-back to central chest area and patient is worried that this may develop to breast cancer. She also complain of left upper quadrant abdominal pain, rated at 10/10, not related to meal, no known aggravating or relieving factor. She does not have diarrhea or constipation, no urinary symptom. No history of trauma. She has had imaging studies done in the past, MRI Thoracic spine showed: No acute injury of the thoracic spine, Small right paracentral disc protrusion at T8-9., Small left paracentral disc protrusion at T9-10, and No significant thoracic spine spinal stenosis. CT Chest with contrast showed unremarkable findings. Mammogram was normal in 2015. Her BP has not been controlled consistently because of non-adherent and excessive worry. She denies being depressed, she lives at home with husband and 2 little kids. She claims home is safe. Denies suicidal ideation or thought. Patient has No headache, No new weakness tingling or numbness, No Cough - SOB.  Problem  Thoracic Spine Pain  Dyslipidemia  Abdominal Pain, Left Upper Quadrant  Screening for Breast Cancer  Uncontrolled Hypertension   ALLERGIES: No Known Allergies  PAST MEDICAL HISTORY: Past Medical History:  Diagnosis Date  . Heartburn in pregnancy   . Hypertension   . Maternal anemia in pregnancy, antepartum 02/18/2015    MEDICATIONS AT HOME: Prior to Admission medications   Medication Sig Start Date End Date Taking? Authorizing Provider  ferrous sulfate 325  (65 FE) MG tablet Take 1 tablet (325 mg total) by mouth 2 (two) times daily with a meal. 05/14/16  Yes Aqsa Sensabaugh E Doreene Burke, MD  labetalol (NORMODYNE) 300 MG tablet Take 1 tablet (300 mg total) by mouth 2 (two) times daily. 05/14/16  Yes Tresa Garter, MD  lisinopril-hydrochlorothiazide (PRINZIDE,ZESTORETIC) 20-25 MG tablet Take 1 tablet by mouth daily. 05/14/16  Yes Tresa Garter, MD  pravastatin (PRAVACHOL) 20 MG tablet Take 1 tablet (20 mg total) by mouth daily. 05/14/16  Yes Tresa Garter, MD  Prenatal Vit-Fe Fumarate-FA (PRENATAL MULTIVITAMIN) TABS tablet Take 1 tablet by mouth daily at 12 noon. Patient not taking: Reported on 05/14/2016 05/27/15   Venus Standard, CNM   Objective:   Vitals:   05/14/16 0915  BP: (!) 153/104  Pulse: 74  Resp: 16  Temp: 99 F (37.2 C)  TempSrc: Oral  SpO2: 99%  Weight: 167 lb 3.2 oz (75.8 kg)   Exam General appearance : Awake, alert, not in any distress. Speech Clear. Not toxic looking HEENT: Atraumatic and Normocephalic, pupils equally reactive to light and accomodation Neck: Supple, no JVD. No cervical lymphadenopathy.  Chest: Good air entry bilaterally, no added sounds  CVS: S1 S2 regular, no murmurs.  Abdomen: Bowel sounds present, tender left upper quadrant, no mass palpable and not distended with no gaurding, rigidity or rebound. Extremities: B/L Lower Ext shows no edema, both legs are warm to touch Neurology: Awake alert, and oriented X 3, CN II-XII intact, Non focal Skin: No Rash  Data Review No results found for: HGBA1C  Assessment & Plan  1. Dyslipidemia  - pravastatin (PRAVACHOL) 20 MG tablet; Take 1 tablet (20 mg total) by mouth daily.  Dispense: 90 tablet; Refill: 3 To address this please limit saturated fat to no more than 7% of your calories, limit cholesterol to 200 mg/day, increase fiber and exercise as tolerated. If needed we may add another cholesterol lowering medication to your regimen.   2. Thoracic  spine pain: Previous MRI showed T9 - 10 disc protrusion, will refer to orthopedist for further evaluation and possible management  - Ambulatory referral to Orthopedic Surgery  3. Abdominal pain, left upper quadrant  - CT Abdomen Pelvis Wo Contrast; Future  4. Uncontrolled hypertension  - lisinopril-hydrochlorothiazide (PRINZIDE,ZESTORETIC) 20-25 MG tablet; Take 1 tablet by mouth daily.  Dispense: 90 tablet; Refill: 3 - labetalol (NORMODYNE) 300 MG tablet; Take 1 tablet (300 mg total) by mouth 2 (two) times daily.  Dispense: 180 tablet; Refill: 3  We have discussed target BP range and blood pressure goal. I have advised patient to check BP regularly and to call us back or report to clinic if the numbers are consistently higher than 140/90. We discussed the importance of compliance with medical therapy and DASH diet recommended, consequences of uncontrolled hypertension discussed.  - continue current BP medications  5. Other iron deficiency anemia Refill - ferrous sulfate 325 (65 FE) MG tablet; Take 1 tablet (325 mg total) by mouth 2 (two) times daily with a meal.  Dispense: 180 tablet; Refill: 3  6. Screening for breast cancer  - MM Digital Screening; Future  Patient have been counseled extensively about nutrition and exercise. Other issues discussed during this visit include: low cholesterol diet, weight control and daily exercise, importance of adherence with medications and regular follow-up. We also discussed long term complications of uncontrolled hypertension.   Return in about 3 months (around 08/14/2016) for Follow up Pain and comorbidities, Follow up HTN.  The patient was given clear instructions to go to ER or return to medical center if symptoms don't improve, worsen or new problems develop. The patient verbalized understanding. The patient was told to call to get lab results if they haven't heard anything in the next week.   This note has been created with Biomedical engineer. Any transcriptional errors are unintentional.    Angelica Chessman, MD, [place], [place], [place] and [place], [place]   05/14/2016, 10:03 AM

## 2016-05-21 ENCOUNTER — Ambulatory Visit (HOSPITAL_COMMUNITY): Payer: BLUE CROSS/BLUE SHIELD

## 2016-05-21 ENCOUNTER — Ambulatory Visit (HOSPITAL_COMMUNITY)
Admission: RE | Admit: 2016-05-21 | Discharge: 2016-05-21 | Disposition: A | Discharge status: Home / Self Care | Payer: BLUE CROSS/BLUE SHIELD | Source: Ambulatory Visit | Attending: Internal Medicine | Admitting: Internal Medicine

## 2016-05-21 DIAGNOSIS — I517 Cardiomegaly: Secondary | ICD-10-CM | POA: Insufficient documentation

## 2016-05-21 DIAGNOSIS — R1012 Left upper quadrant pain: Secondary | ICD-10-CM | POA: Diagnosis not present

## 2016-05-21 DIAGNOSIS — R911 Solitary pulmonary nodule: Secondary | ICD-10-CM | POA: Diagnosis not present

## 2016-05-21 DIAGNOSIS — D259 Leiomyoma of uterus, unspecified: Secondary | ICD-10-CM | POA: Insufficient documentation

## 2016-05-26 ENCOUNTER — Telehealth: Payer: Self-pay | Admitting: *Deleted

## 2016-05-26 NOTE — Telephone Encounter (Signed)
Medical Assistant left message on patient's home and cell voicemail. Voicemail states to give a call back to Nubia with [facility] at [phone].  

## 2016-05-26 NOTE — Telephone Encounter (Signed)
-----   Message from Tresa Garter, MD sent at 05/26/2016 11:00 AM EDT ----- Please inform patient that her CT abdomen and pelvis showed small uterine fibroids, and 4.2 mm nodule posterior aspect right lower lobe of the lung, 2.2 mm nodule peripheral aspect right middle lobe. If at high risk for lung cancer (smoking history) follow-up chest CT at 1 year is recommended. If at low risk, no follow-up is needed. No bony destructive lesion. 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 No change in treatment at this time.

## 2016-06-16 ENCOUNTER — Ambulatory Visit
Admission: RE | Admit: 2016-06-16 | Discharge: 2016-06-16 | Disposition: A | Discharge status: Home / Self Care | Payer: BLUE CROSS/BLUE SHIELD | Source: Ambulatory Visit | Attending: Internal Medicine | Admitting: Internal Medicine

## 2016-06-16 DIAGNOSIS — Z1239 Encounter for other screening for malignant neoplasm of breast: Secondary | ICD-10-CM

## 2016-06-23 ENCOUNTER — Telehealth: Payer: Self-pay | Admitting: *Deleted

## 2016-06-23 NOTE — Telephone Encounter (Signed)
Medical Assistant left message on patient's home and cell voicemail. Voicemail states to give a call back to Kimber Fritts with [facility] at [phone]. !!!Please inform patient of mammogram showing no evidence of malignancy and a recommended screening be completed in one year!!! 

## 2016-06-23 NOTE — Telephone Encounter (Signed)
-----   Message from Tresa Garter, MD sent at 06/20/2016 10:18 AM EST ----- Please inform patient that her screening mammogram shows no evidence of malignancy. Recommend screening mammogram in one year

## 2016-06-29 ENCOUNTER — Telehealth: Payer: Self-pay | Admitting: *Deleted

## 2016-06-29 NOTE — Telephone Encounter (Signed)
Patient verified DOB Patients husband is aware of mammogram showing no evidence of malignancy and a recommended screening be completed in one year. No further questions at this time.

## 2016-09-09 ENCOUNTER — Ambulatory Visit: Payer: BLUE CROSS/BLUE SHIELD | Admitting: Internal Medicine

## 2016-12-23 ENCOUNTER — Encounter: Payer: Self-pay | Admitting: Internal Medicine

## 2016-12-23 ENCOUNTER — Ambulatory Visit: Payer: BLUE CROSS/BLUE SHIELD | Attending: Internal Medicine | Admitting: Internal Medicine

## 2016-12-23 ENCOUNTER — Other Ambulatory Visit (HOSPITAL_COMMUNITY)
Admission: RE | Admit: 2016-12-23 | Discharge: 2016-12-23 | Disposition: A | Discharge status: Home / Self Care | Payer: BLUE CROSS/BLUE SHIELD | Source: Ambulatory Visit | Attending: Internal Medicine | Admitting: Internal Medicine

## 2016-12-23 VITALS — BP 166/111 | HR 75 | Temp 99.5°F | Resp 16 | Wt 161.6 lb

## 2016-12-23 DIAGNOSIS — M546 Pain in thoracic spine: Secondary | ICD-10-CM | POA: Diagnosis not present

## 2016-12-23 DIAGNOSIS — N643 Galactorrhea not associated with childbirth: Secondary | ICD-10-CM | POA: Insufficient documentation

## 2016-12-23 DIAGNOSIS — E785 Hyperlipidemia, unspecified: Secondary | ICD-10-CM | POA: Insufficient documentation

## 2016-12-23 DIAGNOSIS — G8929 Other chronic pain: Secondary | ICD-10-CM | POA: Diagnosis not present

## 2016-12-23 DIAGNOSIS — I10 Essential (primary) hypertension: Secondary | ICD-10-CM | POA: Diagnosis not present

## 2016-12-23 DIAGNOSIS — Z124 Encounter for screening for malignant neoplasm of cervix: Secondary | ICD-10-CM | POA: Diagnosis present

## 2016-12-23 DIAGNOSIS — Z131 Encounter for screening for diabetes mellitus: Secondary | ICD-10-CM | POA: Diagnosis not present

## 2016-12-23 DIAGNOSIS — F5101 Primary insomnia: Secondary | ICD-10-CM | POA: Insufficient documentation

## 2016-12-23 DIAGNOSIS — M549 Dorsalgia, unspecified: Secondary | ICD-10-CM | POA: Diagnosis present

## 2016-12-23 DIAGNOSIS — Z01419 Encounter for gynecological examination (general) (routine) without abnormal findings: Secondary | ICD-10-CM | POA: Diagnosis present

## 2016-12-23 LAB — POCT GLYCOSYLATED HEMOGLOBIN (HGB A1C): HEMOGLOBIN A1C: 5.4

## 2016-12-23 MED ORDER — PANTOPRAZOLE SODIUM 40 MG PO TBEC
40.0000 mg | DELAYED_RELEASE_TABLET | Freq: Every day | ORAL | 3 refills | Status: DC
Start: 2016-12-23 — End: 2018-05-04

## 2016-12-23 MED ORDER — ZOLPIDEM TARTRATE 5 MG PO TABS: 5.0000 mg | ORAL_TABLET | Freq: Every evening | ORAL | 1 refills | Status: DC | PRN

## 2016-12-23 MED ORDER — PRAVASTATIN SODIUM 20 MG PO TABS: 20.0000 mg | ORAL_TABLET | Freq: Every day | ORAL | 3 refills | Status: DC

## 2016-12-23 MED ORDER — LISINOPRIL-HYDROCHLOROTHIAZIDE 20-25 MG PO TABS: 1.0000 | ORAL_TABLET | Freq: Every day | ORAL | 3 refills | Status: DC

## 2016-12-23 MED ORDER — LABETALOL HCL 300 MG PO TABS: 300.0000 mg | ORAL_TABLET | Freq: Two times a day (BID) | ORAL | 3 refills | Status: DC

## 2016-12-23 MED FILL — LABETALOL HCL 300 MG TABLET: 300 | 30 days supply | Qty: 60 | Fill #0

## 2016-12-23 MED FILL — PRAVASTATIN NA 20 MG TAB: 20 | 30 days supply | Qty: 30 | Fill #0

## 2016-12-23 MED FILL — PANTOPRAZOLE SOD DR 40 MG T: 40 | 30 days supply | Qty: 30 | Fill #0

## 2016-12-23 MED FILL — LISINOPRIL-HCTZ 20-25 MG TA: 20-25 | 30 days supply | Qty: 30 | Fill #0

## 2016-12-23 NOTE — Progress Notes (Signed)
Megan Baxter, is a 46 y.o. female  [id number]  [id number]  DOB - Aug 25, 1970  Chief Complaint  Patient presents with  . Back Pain  . Gynecologic Exam      Subjective:   Megan Baxter is a 46 y.o. female with history of hypertension and chronic back pain here today for a follow up visit, pap smear and medication refill. BP remains uncontrolled because of non-adherent with prescribed medications. Patient is only taking one of her prescribed BP meds: Labetalol, she did not fill the other prescription (lisinopril/HCTZ) for unknown reason, but I suspect financial issues.Patient is complaining of on-going severe mid-back pain, ongoing for at least 3 years but getting worse. No urinary or fecal incontinence, no lower limb weakness or numbness. Pain radiates from mid-back to central chest area. Patient is constantly worried that she may have "cancer". She has been worked up with MRI and CT scan with no significant findings. She denies being depressed, she denies any domestic stressor. Mammogram was normal. She will have pap smear today. No medical indication for colonoscopy at her age. She has a 16 months old son, stopped breast feeding few months ago but continues to have milk ejection from her breasts. No nipple pain or discharge. No lump. She needs refill on her medications. She is constantly worried to the extent that she does not sleep at night. Its difficult for her to initiate or maintain sleep. Patient has No headache, No chest pain, No abdominal pain - No Nausea, No new weakness tingling or numbness, No Cough - SOB.  Problem  Primary Insomnia  Galactorrhea in Female  Cervical Cancer Screening    ALLERGIES: No Known Allergies  PAST MEDICAL HISTORY: Past Medical History:  Diagnosis Date  . Heartburn in pregnancy   . Hypertension   . Maternal anemia in pregnancy, antepartum 02/18/2015    MEDICATIONS AT HOME: Prior to Admission medications   Medication Sig Start Date End  Date Taking? Authorizing Provider  acetaminophen-codeine (TYLENOL #3) 300-30 MG tablet Take 1 tablet by mouth every 4 (four) hours as needed. Patient not taking: Reported on 12/23/2016 05/14/16   Tresa Garter, MD  ferrous sulfate 325 (65 FE) MG tablet Take 1 tablet (325 mg total) by mouth 2 (two) times daily with a meal. 05/14/16   Jc Veron E, MD  labetalol (NORMODYNE) 300 MG tablet Take 1 tablet (300 mg total) by mouth 2 (two) times daily. 12/23/16   Tresa Garter, MD  lisinopril-hydrochlorothiazide (PRINZIDE,ZESTORETIC) 20-25 MG tablet Take 1 tablet by mouth daily. 12/23/16   Tresa Garter, MD  pantoprazole (PROTONIX) 40 MG tablet Take 1 tablet (40 mg total) by mouth daily. 12/23/16   Tresa Garter, MD  pravastatin (PRAVACHOL) 20 MG tablet Take 1 tablet (20 mg total) by mouth daily. 12/23/16   Tresa Garter, MD  Prenatal Vit-Fe Fumarate-FA (PRENATAL MULTIVITAMIN) TABS tablet Take 1 tablet by mouth daily at 12 noon. Patient not taking: Reported on 05/14/2016 05/27/15   Standard, Venus, CNM  zolpidem (AMBIEN) 5 MG tablet Take 1 tablet (5 mg total) by mouth at bedtime as needed for sleep. 12/23/16   Tresa Garter, MD    Objective:   Vitals:   12/23/16 1351  BP: (!) 166/111  Pulse: 75  Resp: 16  Temp: 99.5 F (37.5 C)  TempSrc: Oral  SpO2: 95%  Weight: 161 lb 9.6 oz (73.3 kg)   Exam General appearance : Awake, alert, not in any distress. Speech Clear. Not toxic looking  HEENT: Atraumatic and Normocephalic, pupils equally reactive to light and accomodation Neck: Supple, no JVD. No cervical lymphadenopathy.  Chest: Good air entry bilaterally, no added sounds  CVS: S1 S2 regular, no murmurs.  Abdomen: Bowel sounds present, Non tender and not distended with no gaurding, rigidity or rebound. Extremities: B/L Lower Ext shows no edema, both legs are warm to touch Neurology: Awake alert, and oriented X 3, CN II-XII intact, Non focal Skin: No  Rash  Data Review Lab Results  Component Value Date   HGBA1C 5.4 12/23/2016    Assessment & Plan   1. Diabetes mellitus screening  - POCT glycosylated hemoglobin (Hb A1C): Normal  2. Uncontrolled hypertension  - labetalol (NORMODYNE) 300 MG tablet; Take 1 tablet (300 mg total) by mouth 2 (two) times daily.  Dispense: 180 tablet; Refill: 3 - lisinopril-hydrochlorothiazide (PRINZIDE,ZESTORETIC) 20-25 MG tablet; Take 1 tablet by mouth daily.  Dispense: 90 tablet; Refill: 3 - CBC with Differential/Platelet - CMP14+EGFR  3. Thoracic spine pain  - VITAMIN D 25 Hydroxy (Vit-D Deficiency, Fractures)  4. Dyslipidemia  - pravastatin (PRAVACHOL) 20 MG tablet; Take 1 tablet (20 mg total) by mouth daily.  Dispense: 90 tablet; Refill: 3 - Lipid panel  5. Primary insomnia  - zolpidem (AMBIEN) 5 MG tablet; Take 1 tablet (5 mg total) by mouth at bedtime as needed for sleep.  Dispense: 30 tablet; Refill: 1  - TSH  6. Galactorrhea in female  - Prolactin  7. Cervical cancer screening  - Cytology - PAP - Cervicovaginal ancillary only  Patient have been counseled extensively about nutrition and exercise. Other issues discussed during this visit include: low cholesterol diet, weight control and daily exercise, importance of adherence with medications and regular follow-up. We also discussed long term complications of uncontrolled hypertension.   Return in about 4 weeks (around 01/20/2017) for CPP [facility] for BP/CBG and DM management.  The patient was given clear instructions to go to ER or return to medical center if symptoms don't improve, worsen or new problems develop. The patient verbalized understanding. The patient was told to call to get lab results if they haven't heard anything in the next week.   This note has been created with Surveyor, quantity. Any transcriptional errors are unintentional.    Angelica Chessman, MD, [place], [place],  [place], [place] and [place], [place]   12/23/2016, 2:10 PM

## 2016-12-23 NOTE — Patient Instructions (Signed)
DASH Eating Plan DASH stands for "Dietary Approaches to Stop Hypertension." The DASH eating plan is a healthy eating plan that has been shown to reduce high blood pressure (hypertension). It may also reduce your risk for type 2 diabetes, heart disease, and stroke. The DASH eating plan may also help with weight loss. What are tips for following this plan? General guidelines   Avoid eating more than 2,300 mg (milligrams) of salt (sodium) a day. If you have hypertension, you may need to reduce your sodium intake to 1,500 mg a day.  Limit alcohol intake to no more than 1 drink a day for nonpregnant women and 2 drinks a day for men. One drink equals 12 oz of beer, 5 oz of wine, or 1 oz of hard liquor.  Work with your health care provider to maintain a healthy body weight or to lose weight. Ask what an ideal weight is for you.  Get at least 30 minutes of exercise that causes your heart to beat faster (aerobic exercise) most days of the week. Activities may include walking, swimming, or biking.  Work with your health care provider or diet and nutrition specialist (dietitian) to adjust your eating plan to your individual calorie needs. Reading food labels   Check food labels for the amount of sodium per serving. Choose foods with less than 5 percent of the Daily Value of sodium. Generally, foods with less than 300 mg of sodium per serving fit into this eating plan.  To find whole grains, look for the word "whole" as the first word in the ingredient list. Shopping   Buy products labeled as "low-sodium" or "no salt added."  Buy fresh foods. Avoid canned foods and premade or frozen meals. Cooking   Avoid adding salt when cooking. Use salt-free seasonings or herbs instead of table salt or sea salt. Check with your health care provider or pharmacist before using salt substitutes.  Do not fry foods. Cook foods using healthy methods such as baking, boiling, grilling, and broiling instead.  Cook with  heart-healthy oils, such as olive, canola, soybean, or sunflower oil. Meal planning    Eat a balanced diet that includes:  5 or more servings of fruits and vegetables each day. At each meal, try to fill half of your plate with fruits and vegetables.  Up to 6-8 servings of whole grains each day.  Less than 6 oz of lean meat, poultry, or fish each day. A 3-oz serving of meat is about the same size as a deck of cards. One egg equals 1 oz.  2 servings of low-fat dairy each day.  A serving of nuts, seeds, or beans 5 times each week.  Heart-healthy fats. Healthy fats called Omega-3 fatty acids are found in foods such as flaxseeds and coldwater fish, like sardines, salmon, and mackerel.  Limit how much you eat of the following:  Canned or prepackaged foods.  Food that is high in trans fat, such as fried foods.  Food that is high in saturated fat, such as fatty meat.  Sweets, desserts, sugary drinks, and other foods with added sugar.  Full-fat dairy products.  Do not salt foods before eating.  Try to eat at least 2 vegetarian meals each week.  Eat more home-cooked food and less restaurant, buffet, and fast food.  When eating at a restaurant, ask that your food be prepared with less salt or no salt, if possible. What foods are recommended? The items listed may not be a complete list. Talk   with your dietitian about what dietary choices are best for you. Grains  Whole-grain or whole-wheat bread. Whole-grain or whole-wheat pasta. Brown rice. [place]. Bulgur. Whole-grain and low-sodium cereals. Pita bread. Low-fat, low-sodium crackers. Whole-wheat flour tortillas. Vegetables  Fresh or frozen vegetables (raw, steamed, roasted, or grilled). Low-sodium or reduced-sodium tomato and vegetable juice. Low-sodium or reduced-sodium tomato sauce and tomato paste. Low-sodium or reduced-sodium canned vegetables. Fruits  All fresh, dried, or frozen fruit. Canned fruit in natural juice  (without added sugar). Meat and other protein foods  Skinless chicken or [place]. Ground chicken or [place]. Pork with fat trimmed off. Fish and seafood. Egg whites. Dried beans, peas, or lentils. Unsalted nuts, nut butters, and seeds. Unsalted canned beans. Lean cuts of beef with fat trimmed off. Low-sodium, lean deli meat. Dairy  Low-fat (1%) or fat-free (skim) milk. Fat-free, low-fat, or reduced-fat cheeses. Nonfat, low-sodium ricotta or cottage cheese. Low-fat or nonfat yogurt. Low-fat, low-sodium cheese. Fats and oils  Soft margarine without trans fats. Vegetable oil. Low-fat, reduced-fat, or light mayonnaise and salad dressings (reduced-sodium). Canola, safflower, olive, soybean, and sunflower oils. Avocado. Seasoning and other foods  Herbs. Spices. Seasoning mixes without salt. Unsalted popcorn and pretzels. Fat-free sweets. What foods are not recommended? The items listed may not be a complete list. Talk with your dietitian about what dietary choices are best for you. Grains  Baked goods made with fat, such as croissants, muffins, or some breads. Dry pasta or rice meal packs. Vegetables  Creamed or fried vegetables. Vegetables in a cheese sauce. Regular canned vegetables (not low-sodium or reduced-sodium). Regular canned tomato sauce and paste (not low-sodium or reduced-sodium). Regular tomato and vegetable juice (not low-sodium or reduced-sodium). Angie Fava. Olives. Fruits  Canned fruit in a light or heavy syrup. Fried fruit. Fruit in cream or butter sauce. Meat and other protein foods  Fatty cuts of meat. Ribs. Fried meat. Berniece Salines. Sausage. Bologna and other processed lunch meats. Salami. Fatback. Hotdogs. Bratwurst. Salted nuts and seeds. Canned beans with added salt. Canned or smoked fish. Whole eggs or egg yolks. Chicken or [place] with skin. Dairy  Whole or 2% milk, cream, and half-and-half. Whole or full-fat cream cheese. Whole-fat or sweetened yogurt. Full-fat cheese. Nondairy creamers.  Whipped toppings. Processed cheese and cheese spreads. Fats and oils  Butter. Stick margarine. Lard. Shortening. Ghee. Bacon fat. Tropical oils, such as coconut, palm kernel, or palm oil. Seasoning and other foods  Salted popcorn and pretzels. Onion salt, garlic salt, seasoned salt, table salt, and sea salt. Worcestershire sauce. Tartar sauce. Barbecue sauce. Teriyaki sauce. Soy sauce, including reduced-sodium. Steak sauce. Canned and packaged gravies. Fish sauce. Oyster sauce. Cocktail sauce. Horseradish that you find on the shelf. Ketchup. Mustard. Meat flavorings and tenderizers. Bouillon cubes. Hot sauce and Tabasco sauce. Premade or packaged marinades. Premade or packaged taco seasonings. Relishes. Regular salad dressings. Where to find more information:  National Heart, Lung, and [place]: https://wilson-eaton.com/  American Heart Association: www.heart.org Summary  The DASH eating plan is a healthy eating plan that has been shown to reduce high blood pressure (hypertension). It may also reduce your risk for type 2 diabetes, heart disease, and stroke.  With the DASH eating plan, you should limit salt (sodium) intake to 2,300 mg a day. If you have hypertension, you may need to reduce your sodium intake to 1,500 mg a day.  When on the DASH eating plan, aim to eat more fresh fruits and vegetables, whole grains, lean proteins, low-fat dairy, and heart-healthy fats.  Work  with your health care provider or diet and nutrition specialist (dietitian) to adjust your eating plan to your individual calorie needs. This information is not intended to replace advice given to you by your health care provider. Make sure you discuss any questions you have with your health care provider. Document Released: 07/02/2011 Document Revised: 07/06/2016 Document Reviewed: 07/06/2016 Elsevier Interactive Patient Education  2017 [employer] Hypertension Hypertension, commonly called high blood pressure, is when  the force of blood pumping through the arteries is too strong. The arteries are the blood vessels that carry blood from the heart throughout the body. Hypertension forces the heart to work harder to pump blood and may cause arteries to become narrow or stiff. Having untreated or uncontrolled hypertension can cause heart attacks, strokes, kidney disease, and other problems. A blood pressure reading consists of a higher number over a lower number. Ideally, your blood pressure should be below 120/80. The first ("top") number is called the systolic pressure. It is a measure of the pressure in your arteries as your heart beats. The second ("bottom") number is called the diastolic pressure. It is a measure of the pressure in your arteries as the heart relaxes. What are the causes? The cause of this condition is not known. What increases the risk? Some risk factors for high blood pressure are under your control. Others are not. Factors you can change   Smoking.  Having type 2 diabetes mellitus, high cholesterol, or both.  Not getting enough exercise or physical activity.  Being overweight.  Having too much fat, sugar, calories, or salt (sodium) in your diet.  Drinking too much alcohol. Factors that are difficult or impossible to change   Having chronic kidney disease.  Having a family history of high blood pressure.  Age. Risk increases with age.  Race. You may be at higher risk if you are African-American.  Gender. Men are at higher risk than women before age 45. After age 65, women are at higher risk than men.  Having obstructive sleep apnea.  Stress. What are the signs or symptoms? Extremely high blood pressure (hypertensive crisis) may cause:  Headache.  Anxiety.  Shortness of breath.  Nosebleed.  Nausea and vomiting.  Severe chest pain.  Jerky movements you cannot control (seizures). How is this diagnosed? This condition is diagnosed by measuring your blood pressure  while you are seated, with your arm resting on a surface. The cuff of the blood pressure monitor will be placed directly against the skin of your upper arm at the level of your heart. It should be measured at least twice using the same arm. Certain conditions can cause a difference in blood pressure between your right and left arms. Certain factors can cause blood pressure readings to be lower or higher than normal (elevated) for a short period of time:  When your blood pressure is higher when you are in a health care provider's office than when you are at home, this is called white coat hypertension. Most people with this condition do not need medicines.  When your blood pressure is higher at home than when you are in a health care provider's office, this is called masked hypertension. Most people with this condition may need medicines to control blood pressure. If you have a high blood pressure reading during one visit or you have normal blood pressure with other risk factors:  You may be asked to return on a different day to have your blood pressure checked again.  You may   be asked to monitor your blood pressure at home for 1 week or longer. If you are diagnosed with hypertension, you may have other blood or imaging tests to help your health care provider understand your overall risk for other conditions. How is this treated? This condition is treated by making healthy lifestyle changes, such as eating healthy foods, exercising more, and reducing your alcohol intake. Your health care provider may prescribe medicine if lifestyle changes are not enough to get your blood pressure under control, and if:  Your systolic blood pressure is above 130.  Your diastolic blood pressure is above 80. Your personal target blood pressure may vary depending on your medical conditions, your age, and other factors. Follow these instructions at home: Eating and drinking   Eat a diet that is high in fiber and  potassium, and low in sodium, added sugar, and fat. An example eating plan is called the DASH (Dietary Approaches to Stop Hypertension) diet. To eat this way:  Eat plenty of fresh fruits and vegetables. Try to fill half of your plate at each meal with fruits and vegetables.  Eat whole grains, such as whole wheat pasta, brown rice, or whole grain bread. Fill about one quarter of your plate with whole grains.  Eat or drink low-fat dairy products, such as skim milk or low-fat yogurt.  Avoid fatty cuts of meat, processed or cured meats, and poultry with skin. Fill about one quarter of your plate with lean proteins, such as fish, chicken without skin, beans, eggs, and tofu.  Avoid premade and processed foods. These tend to be higher in sodium, added sugar, and fat.  Reduce your daily sodium intake. Most people with hypertension should eat less than 1,500 mg of sodium a day.  Limit alcohol intake to no more than 1 drink a day for nonpregnant women and 2 drinks a day for men. One drink equals 12 oz of beer, 5 oz of wine, or 1 oz of hard liquor. Lifestyle   Work with your health care provider to maintain a healthy body weight or to lose weight. Ask what an ideal weight is for you.  Get at least 30 minutes of exercise that causes your heart to beat faster (aerobic exercise) most days of the week. Activities may include walking, swimming, or biking.  Include exercise to strengthen your muscles (resistance exercise), such as pilates or lifting weights, as part of your weekly exercise routine. Try to do these types of exercises for 30 minutes at least 3 days a week.  Do not use any products that contain nicotine or tobacco, such as cigarettes and e-cigarettes. If you need help quitting, ask your health care provider.  Monitor your blood pressure at home as told by your health care provider.  Keep all follow-up visits as told by your health care provider. This is important. Medicines   Take  over-the-counter and prescription medicines only as told by your health care provider. Follow directions carefully. Blood pressure medicines must be taken as prescribed.  Do not skip doses of blood pressure medicine. Doing this puts you at risk for problems and can make the medicine less effective.  Ask your health care provider about side effects or reactions to medicines that you should watch for. Contact a health care provider if:  You think you are having a reaction to a medicine you are taking.  You have headaches that keep coming back (recurring).  You feel dizzy.  You have swelling in your ankles.  You  have trouble with your vision. Get help right away if:  You develop a severe headache or confusion.  You have unusual weakness or numbness.  You feel faint.  You have severe pain in your chest or abdomen.  You vomit repeatedly.  You have trouble breathing. Summary  Hypertension is when the force of blood pumping through your arteries is too strong. If this condition is not controlled, it may put you at risk for serious complications.  Your personal target blood pressure may vary depending on your medical conditions, your age, and other factors. For most people, a normal blood pressure is less than 120/80.  Hypertension is treated with lifestyle changes, medicines, or a combination of both. Lifestyle changes include weight loss, eating a healthy, low-sodium diet, exercising more, and limiting alcohol. This information is not intended to replace advice given to you by your health care provider. Make sure you discuss any questions you have with your health care provider. Document Released: 07/13/2005 Document Revised: 06/10/2016 Document Reviewed: 06/10/2016 Elsevier Interactive Patient Education  2017 [employer].

## 2016-12-24 LAB — CERVICOVAGINAL ANCILLARY ONLY
Chlamydia: NEGATIVE
NEISSERIA GONORRHEA: NEGATIVE
Wet Prep (BD Affirm): NEGATIVE

## 2016-12-24 LAB — CBC WITH DIFFERENTIAL/PLATELET
BASOS ABS: 0 10*3/uL (ref 0.0–0.2)
Basos: 1 %
EOS (ABSOLUTE): 0.1 10*3/uL (ref 0.0–0.4)
Eos: 3 %
Hematocrit: 33.8 % — ABNORMAL LOW (ref 34.0–46.6)
Hemoglobin: 10.7 g/dL — ABNORMAL LOW (ref 11.1–15.9)
Immature Grans (Abs): 0 10*3/uL (ref 0.0–0.1)
Immature Granulocytes: 0 %
LYMPHS ABS: 1.8 10*3/uL (ref 0.7–3.1)
Lymphs: 38 %
MCH: 25.1 pg — AB (ref 26.6–33.0)
MCHC: 31.7 g/dL (ref 31.5–35.7)
MCV: 79 fL (ref 79–97)
MONOCYTES: 6 %
MONOS ABS: 0.3 10*3/uL (ref 0.1–0.9)
NEUTROS ABS: 2.5 10*3/uL (ref 1.4–7.0)
Neutrophils: 52 %
PLATELETS: 458 10*3/uL — AB (ref 150–379)
RBC: 4.26 x10E6/uL (ref 3.77–5.28)
RDW: 15.1 % (ref 12.3–15.4)
WBC: 4.7 10*3/uL (ref 3.4–10.8)

## 2016-12-24 LAB — LIPID PANEL
Chol/HDL Ratio: 4 ratio (ref 0.0–4.4)
Cholesterol, Total: 206 mg/dL — ABNORMAL HIGH (ref 100–199)
HDL: 51 mg/dL (ref >39)
LDL CALC: 147 mg/dL — AB (ref 0–99)
TRIGLYCERIDES: 39 mg/dL (ref 0–149)
VLDL CHOLESTEROL CAL: 8 mg/dL (ref 5–40)

## 2016-12-24 LAB — CMP14+EGFR
ALBUMIN: 4.2 g/dL (ref 3.5–5.5)
ALK PHOS: 80 IU/L (ref 39–117)
ALT: 17 IU/L (ref 0–32)
AST: 14 IU/L (ref 0–40)
Albumin/Globulin Ratio: 1.4 (ref 1.2–2.2)
BILIRUBIN TOTAL: 0.5 mg/dL (ref 0.0–1.2)
BUN / CREAT RATIO: 11 (ref 9–23)
BUN: 9 mg/dL (ref 6–24)
CHLORIDE: 101 mmol/L (ref 96–106)
CO2: 24 mmol/L (ref 18–29)
CREATININE: 0.81 mg/dL (ref 0.57–1.00)
Calcium: 9.7 mg/dL (ref 8.7–10.2)
GFR calc Af Amer: 102 mL/min/{1.73_m2} (ref >59)
GFR calc non Af Amer: 89 mL/min/{1.73_m2} (ref >59)
GLUCOSE: 96 mg/dL (ref 65–99)
Globulin, Total: 3 g/dL (ref 1.5–4.5)
Potassium: 4.6 mmol/L (ref 3.5–5.2)
Sodium: 139 mmol/L (ref 134–144)
Total Protein: 7.2 g/dL (ref 6.0–8.5)

## 2016-12-24 LAB — VITAMIN D 25 HYDROXY (VIT D DEFICIENCY, FRACTURES): Vit D, 25-Hydroxy: 28.3 ng/mL — ABNORMAL LOW (ref 30.0–100.0)

## 2016-12-24 LAB — PROLACTIN: Prolactin: 30.1 ng/mL — ABNORMAL HIGH (ref 4.8–23.3)

## 2016-12-24 LAB — TSH: TSH: 1.29 u[IU]/mL (ref 0.450–4.500)

## 2016-12-29 LAB — CYTOLOGY - PAP
Diagnosis: NEGATIVE
HPV: NOT DETECTED

## 2017-01-04 ENCOUNTER — Other Ambulatory Visit: Payer: Self-pay | Admitting: Internal Medicine

## 2017-01-04 DIAGNOSIS — D508 Other iron deficiency anemias: Secondary | ICD-10-CM

## 2017-01-04 MED ORDER — FERROUS SULFATE 325 (65 FE) MG PO TABS: 325.0000 mg | ORAL_TABLET | Freq: Two times a day (BID) | ORAL | 3 refills | Status: DC

## 2017-01-21 ENCOUNTER — Telehealth: Payer: Self-pay | Admitting: *Deleted

## 2017-01-21 NOTE — Telephone Encounter (Signed)
-----   Message from Tresa Garter, MD sent at 01/04/2017 10:34 AM EDT ----- Please inform patient that her hemoglobin is low, recommend restarting her iron supplement (sent to pharmacy). Cholesterol is high, please take you cholesterol medicine as prescribed and also please limit saturated fat to no more than 7% of your calories, limit cholesterol to 200 mg/day, increase fiber and exercise as tolerated. Pap smear is negative for malignancy, no infection detected. The hormones controlling breast milk production is only slightly elevated, will monitor. If break milk discharge continues, will refer to endocrinologist. All other results are normal.

## 2017-01-21 NOTE — Telephone Encounter (Signed)
Medical Assistant left message on patient's home and cell voicemail. Voicemail states to give a call back to Ercole Georg with [facility] at [phone].  

## 2017-01-23 IMAGING — MG DIGITAL SCREENING BILATERAL MAMMOGRAM WITH CAD
4 series · 4 of 4 positions shown · non-contrast
Comparison: Previous exam(s).

CLINICAL DATA: Screening.

EXAM:
DIGITAL SCREENING BILATERAL MAMMOGRAM WITH CAD

[R MLO]
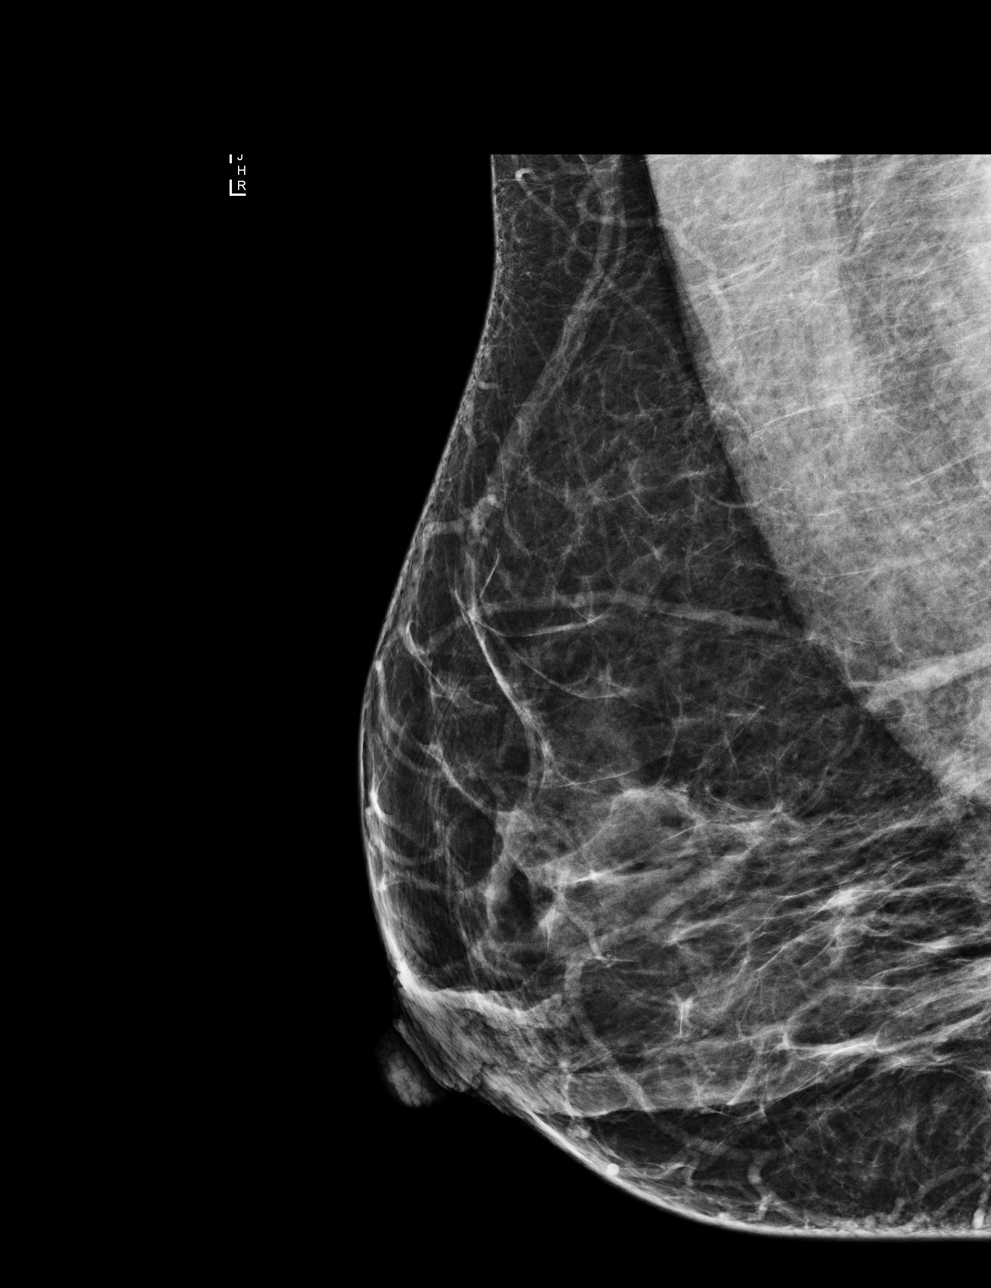

[R CC]
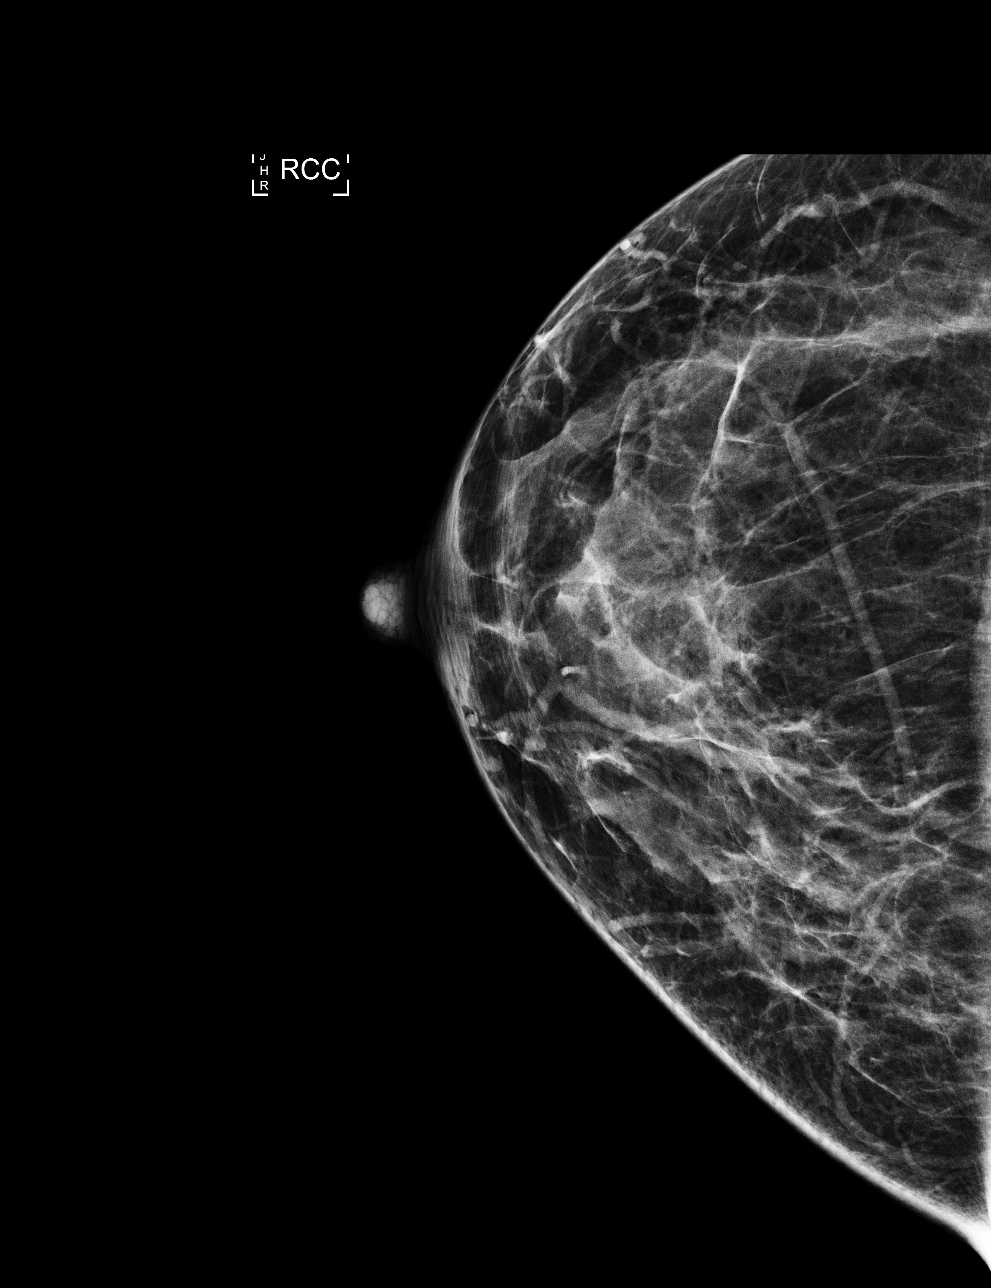

[L MLO]
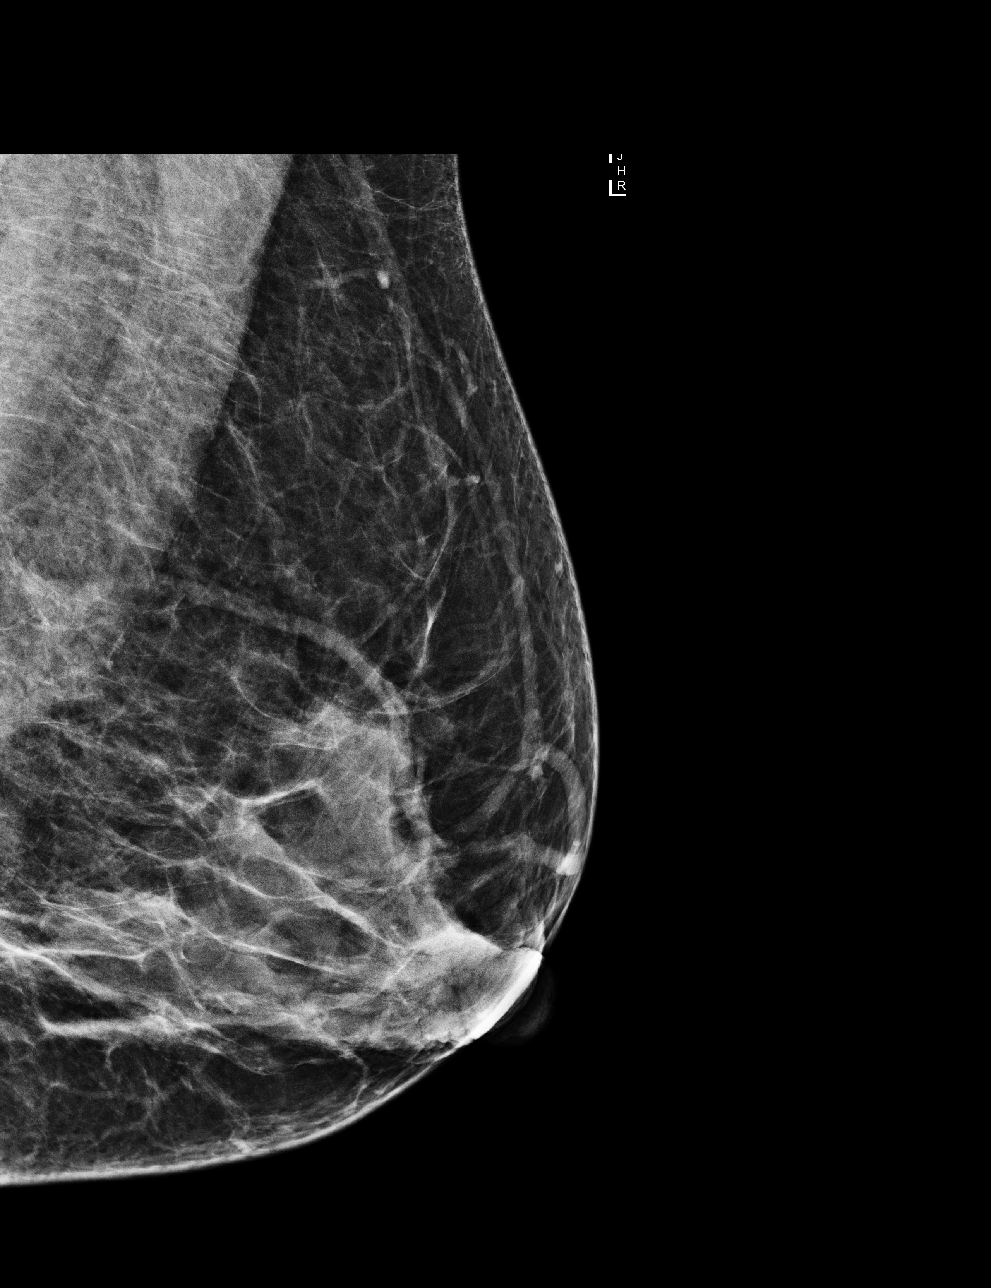

[L CC]
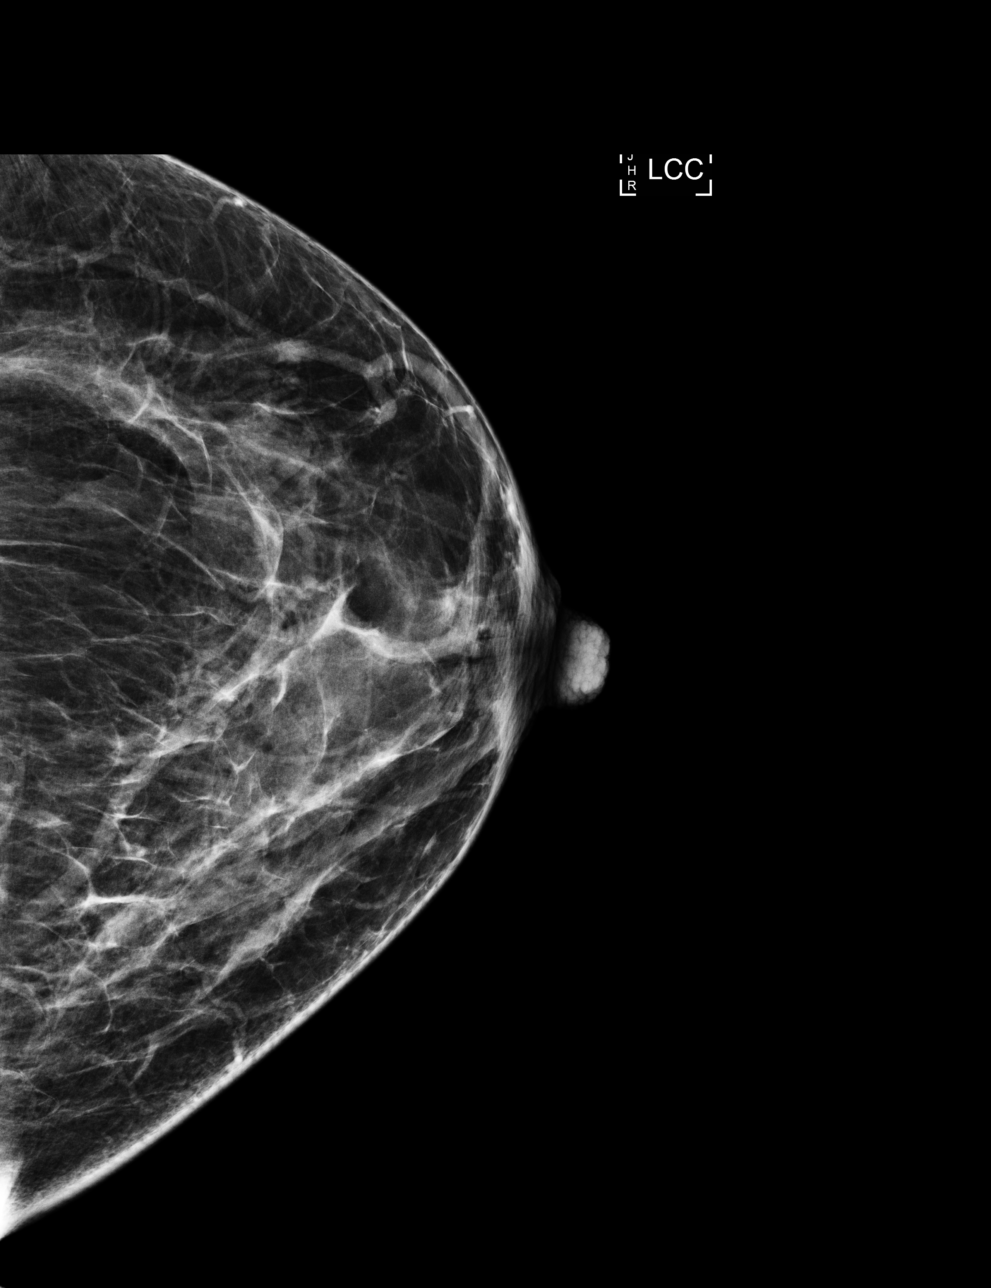

[4 of 4 positions shown; findings below may reference images not displayed]

ACR Breast Density Category c: The breast tissue is heterogeneously
dense, which may obscure small masses.
FINDINGS: There are no findings suspicious for malignancy. Images were
processed with CAD.
IMPRESSION: No mammographic evidence of malignancy. A result letter of this
screening mammogram will be mailed directly to the patient.

RECOMMENDATION:
Screening mammogram in one year. (Code:[id number])

BI-RADS CATEGORY  1: Negative.

## 2017-04-20 ENCOUNTER — Other Ambulatory Visit: Payer: Self-pay | Admitting: Internal Medicine

## 2017-04-20 DIAGNOSIS — I10 Essential (primary) hypertension: Secondary | ICD-10-CM

## 2017-04-20 DIAGNOSIS — E785 Hyperlipidemia, unspecified: Secondary | ICD-10-CM

## 2017-05-11 ENCOUNTER — Other Ambulatory Visit: Payer: Self-pay | Admitting: Internal Medicine

## 2017-05-11 DIAGNOSIS — Z1231 Encounter for screening mammogram for malignant neoplasm of breast: Secondary | ICD-10-CM

## 2017-06-21 ENCOUNTER — Ambulatory Visit
Admission: RE | Admit: 2017-06-21 | Discharge: 2017-06-21 | Disposition: A | Discharge status: Home / Self Care | Payer: BLUE CROSS/BLUE SHIELD | Source: Ambulatory Visit | Attending: Internal Medicine | Admitting: Internal Medicine

## 2017-06-21 DIAGNOSIS — Z1231 Encounter for screening mammogram for malignant neoplasm of breast: Secondary | ICD-10-CM

## 2017-06-26 ENCOUNTER — Other Ambulatory Visit: Payer: Self-pay | Admitting: Internal Medicine

## 2017-06-26 DIAGNOSIS — E785 Hyperlipidemia, unspecified: Secondary | ICD-10-CM

## 2017-06-26 DIAGNOSIS — I10 Essential (primary) hypertension: Secondary | ICD-10-CM

## 2017-06-30 ENCOUNTER — Encounter: Payer: Self-pay | Admitting: Internal Medicine

## 2017-06-30 ENCOUNTER — Ambulatory Visit: Payer: BLUE CROSS/BLUE SHIELD | Attending: Internal Medicine | Admitting: Internal Medicine

## 2017-06-30 VITALS — BP 160/102 | HR 75 | Temp 99.4°F | Resp 18 | Ht 67.0 in | Wt 156.0 lb

## 2017-06-30 DIAGNOSIS — D508 Other iron deficiency anemias: Secondary | ICD-10-CM | POA: Diagnosis not present

## 2017-06-30 DIAGNOSIS — I10 Essential (primary) hypertension: Secondary | ICD-10-CM | POA: Diagnosis not present

## 2017-06-30 DIAGNOSIS — E785 Hyperlipidemia, unspecified: Secondary | ICD-10-CM | POA: Diagnosis not present

## 2017-06-30 MED ORDER — PRAVASTATIN SODIUM 20 MG PO TABS: 20.0000 mg | ORAL_TABLET | Freq: Every day | ORAL | 3 refills | Status: DC

## 2017-06-30 MED ORDER — LABETALOL HCL 300 MG PO TABS: 300.0000 mg | ORAL_TABLET | Freq: Two times a day (BID) | ORAL | 3 refills | Status: DC

## 2017-06-30 MED ORDER — LISINOPRIL-HYDROCHLOROTHIAZIDE 20-25 MG PO TABS: 1.0000 | ORAL_TABLET | Freq: Every day | ORAL | 3 refills | Status: DC

## 2017-06-30 MED ORDER — FERROUS SULFATE 325 (65 FE) MG PO TABS: 325.0000 mg | ORAL_TABLET | Freq: Two times a day (BID) | ORAL | 3 refills | Status: DC

## 2017-06-30 NOTE — Patient Instructions (Signed)
DASH Eating Plan DASH stands for "Dietary Approaches to Stop Hypertension." The DASH eating plan is a healthy eating plan that has been shown to reduce high blood pressure (hypertension). It may also reduce your risk for type 2 diabetes, heart disease, and stroke. The DASH eating plan may also help with weight loss. What are tips for following this plan? General guidelines  Avoid eating more than 2,300 mg (milligrams) of salt (sodium) a day. If you have hypertension, you may need to reduce your sodium intake to 1,500 mg a day.  Limit alcohol intake to no more than 1 drink a day for nonpregnant women and 2 drinks a day for men. One drink equals 12 oz of beer, 5 oz of wine, or 1 oz of hard liquor.  Work with your health care provider to maintain a healthy body weight or to lose weight. Ask what an ideal weight is for you.  Get at least 30 minutes of exercise that causes your heart to beat faster (aerobic exercise) most days of the week. Activities may include walking, swimming, or biking.  Work with your health care provider or diet and nutrition specialist (dietitian) to adjust your eating plan to your individual calorie needs. Reading food labels  Check food labels for the amount of sodium per serving. Choose foods with less than 5 percent of the Daily Value of sodium. Generally, foods with less than 300 mg of sodium per serving fit into this eating plan.  To find whole grains, look for the word "whole" as the first word in the ingredient list. Shopping  Buy products labeled as "low-sodium" or "no salt added."  Buy fresh foods. Avoid canned foods and premade or frozen meals. Cooking  Avoid adding salt when cooking. Use salt-free seasonings or herbs instead of table salt or sea salt. Check with your health care provider or pharmacist before using salt substitutes.  Do not fry foods. Cook foods using healthy methods such as baking, boiling, grilling, and broiling instead.  Cook with  heart-healthy oils, such as olive, canola, soybean, or sunflower oil. Meal planning   Eat a balanced diet that includes: ? 5 or more servings of fruits and vegetables each day. At each meal, try to fill half of your plate with fruits and vegetables. ? Up to 6-8 servings of whole grains each day. ? Less than 6 oz of lean meat, poultry, or fish each day. A 3-oz serving of meat is about the same size as a deck of cards. One egg equals 1 oz. ? 2 servings of low-fat dairy each day. ? A serving of nuts, seeds, or beans 5 times each week. ? Heart-healthy fats. Healthy fats called Omega-3 fatty acids are found in foods such as flaxseeds and coldwater fish, like sardines, salmon, and mackerel.  Limit how much you eat of the following: ? Canned or prepackaged foods. ? Food that is high in trans fat, such as fried foods. ? Food that is high in saturated fat, such as fatty meat. ? Sweets, desserts, sugary drinks, and other foods with added sugar. ? Full-fat dairy products.  Do not salt foods before eating.  Try to eat at least 2 vegetarian meals each week.  Eat more home-cooked food and less restaurant, buffet, and fast food.  When eating at a restaurant, ask that your food be prepared with less salt or no salt, if possible. What foods are recommended? The items listed may not be a complete list. Talk with your dietitian about what   dietary choices are best for you. Grains Whole-grain or whole-wheat bread. Whole-grain or whole-wheat pasta. Brown rice. Oatmeal. Quinoa. Bulgur. Whole-grain and low-sodium cereals. Pita bread. Low-fat, low-sodium crackers. Whole-wheat flour tortillas. Vegetables Fresh or frozen vegetables (raw, steamed, roasted, or grilled). Low-sodium or reduced-sodium tomato and vegetable juice. Low-sodium or reduced-sodium tomato sauce and tomato paste. Low-sodium or reduced-sodium canned vegetables. Fruits All fresh, dried, or frozen fruit. Canned fruit in natural juice (without  added sugar). Meat and other protein foods Skinless chicken or turkey. Ground chicken or turkey. Pork with fat trimmed off. Fish and seafood. Egg whites. Dried beans, peas, or lentils. Unsalted nuts, nut butters, and seeds. Unsalted canned beans. Lean cuts of beef with fat trimmed off. Low-sodium, lean deli meat. Dairy Low-fat (1%) or fat-free (skim) milk. Fat-free, low-fat, or reduced-fat cheeses. Nonfat, low-sodium ricotta or cottage cheese. Low-fat or nonfat yogurt. Low-fat, low-sodium cheese. Fats and oils Soft margarine without trans fats. Vegetable oil. Low-fat, reduced-fat, or light mayonnaise and salad dressings (reduced-sodium). Canola, safflower, olive, soybean, and sunflower oils. Avocado. Seasoning and other foods Herbs. Spices. Seasoning mixes without salt. Unsalted popcorn and pretzels. Fat-free sweets. What foods are not recommended? The items listed may not be a complete list. Talk with your dietitian about what dietary choices are best for you. Grains Baked goods made with fat, such as croissants, muffins, or some breads. Dry pasta or rice meal packs. Vegetables Creamed or fried vegetables. Vegetables in a cheese sauce. Regular canned vegetables (not low-sodium or reduced-sodium). Regular canned tomato sauce and paste (not low-sodium or reduced-sodium). Regular tomato and vegetable juice (not low-sodium or reduced-sodium). Pickles. Olives. Fruits Canned fruit in a light or heavy syrup. Fried fruit. Fruit in cream or butter sauce. Meat and other protein foods Fatty cuts of meat. Ribs. Fried meat. Bacon. Sausage. Bologna and other processed lunch meats. Salami. Fatback. Hotdogs. Bratwurst. Salted nuts and seeds. Canned beans with added salt. Canned or smoked fish. Whole eggs or egg yolks. Chicken or turkey with skin. Dairy Whole or 2% milk, cream, and half-and-half. Whole or full-fat cream cheese. Whole-fat or sweetened yogurt. Full-fat cheese. Nondairy creamers. Whipped toppings.  Processed cheese and cheese spreads. Fats and oils Butter. Stick margarine. Lard. Shortening. Ghee. Bacon fat. Tropical oils, such as coconut, palm kernel, or palm oil. Seasoning and other foods Salted popcorn and pretzels. Onion salt, garlic salt, seasoned salt, table salt, and sea salt. Worcestershire sauce. Tartar sauce. Barbecue sauce. Teriyaki sauce. Soy sauce, including reduced-sodium. Steak sauce. Canned and packaged gravies. Fish sauce. Oyster sauce. Cocktail sauce. Horseradish that you find on the shelf. Ketchup. Mustard. Meat flavorings and tenderizers. Bouillon cubes. Hot sauce and Tabasco sauce. Premade or packaged marinades. Premade or packaged taco seasonings. Relishes. Regular salad dressings. Where to find more information:  National Heart, Lung, and Blood Institute: www.nhlbi.nih.gov  American Heart Association: www.heart.org Summary  The DASH eating plan is a healthy eating plan that has been shown to reduce high blood pressure (hypertension). It may also reduce your risk for type 2 diabetes, heart disease, and stroke.  With the DASH eating plan, you should limit salt (sodium) intake to 2,300 mg a day. If you have hypertension, you may need to reduce your sodium intake to 1,500 mg a day.  When on the DASH eating plan, aim to eat more fresh fruits and vegetables, whole grains, lean proteins, low-fat dairy, and heart-healthy fats.  Work with your health care provider or diet and nutrition specialist (dietitian) to adjust your eating plan to your individual   calorie needs. This information is not intended to replace advice given to you by your health care provider. Make sure you discuss any questions you have with your health care provider. Document Released: 07/02/2011 Document Revised: 07/06/2016 Document Reviewed: 07/06/2016 Elsevier Interactive Patient Education  2017 Elsevier Inc. Hypertension Hypertension is another name for high blood pressure. High blood pressure forces  your heart to work harder to pump blood. This can cause problems over time. There are two numbers in a blood pressure reading. There is a top number (systolic) over a bottom number (diastolic). It is best to have a blood pressure below 120/80. Healthy choices can help lower your blood pressure. You may need medicine to help lower your blood pressure if:  Your blood pressure cannot be lowered with healthy choices.  Your blood pressure is higher than 130/80.  Follow these instructions at home: Eating and drinking  If directed, follow the DASH eating plan. This diet includes: ? Filling half of your plate at each meal with fruits and vegetables. ? Filling one quarter of your plate at each meal with whole grains. Whole grains include whole wheat pasta, brown rice, and whole grain bread. ? Eating or drinking low-fat dairy products, such as skim milk or low-fat yogurt. ? Filling one quarter of your plate at each meal with low-fat (lean) proteins. Low-fat proteins include fish, skinless chicken, eggs, beans, and tofu. ? Avoiding fatty meat, cured and processed meat, or chicken with skin. ? Avoiding premade or processed food.  Eat less than 1,500 mg of salt (sodium) a day.  Limit alcohol use to no more than 1 drink a day for nonpregnant women and 2 drinks a day for men. One drink equals 12 oz of beer, 5 oz of wine, or 1 oz of hard liquor. Lifestyle  Work with your doctor to stay at a healthy weight or to lose weight. Ask your doctor what the best weight is for you.  Get at least 30 minutes of exercise that causes your heart to beat faster (aerobic exercise) most days of the week. This may include walking, swimming, or biking.  Get at least 30 minutes of exercise that strengthens your muscles (resistance exercise) at least 3 days a week. This may include lifting weights or pilates.  Do not use any products that contain nicotine or tobacco. This includes cigarettes and e-cigarettes. If you need  help quitting, ask your doctor.  Check your blood pressure at home as told by your doctor.  Keep all follow-up visits as told by your doctor. This is important. Medicines  Take over-the-counter and prescription medicines only as told by your doctor. Follow directions carefully.  Do not skip doses of blood pressure medicine. The medicine does not work as well if you skip doses. Skipping doses also puts you at risk for problems.  Ask your doctor about side effects or reactions to medicines that you should watch for. Contact a doctor if:  You think you are having a reaction to the medicine you are taking.  You have headaches that keep coming back (recurring).  You feel dizzy.  You have swelling in your ankles.  You have trouble with your vision. Get help right away if:  You get a very bad headache.  You start to feel confused.  You feel weak or numb.  You feel faint.  You get very bad pain in your: ? Chest. ? Belly (abdomen).  You throw up (vomit) more than once.  You have trouble breathing. Summary  Summary  Hypertension is another name for high blood pressure.  Making healthy choices can help lower blood pressure. If your blood pressure cannot be controlled with healthy choices, you may need to take medicine. This information is not intended to replace advice given to you by your health care provider. Make sure you discuss any questions you have with your health care provider. Document Released: 12/30/2007 Document Revised: 06/10/2016 Document Reviewed: 06/10/2016 Elsevier Interactive Patient Education  2018 Elsevier Inc.  

## 2017-06-30 NOTE — Progress Notes (Signed)
Megan Baxter, is a 46 y.o. female  NTI:[id number]  [id number]  DOB - 1970-08-31  Chief Complaint  Patient presents with  . Hypertension       Subjective:   Megan Baxter is a 46 y.o. female with history of hypertension and chronic back pain who presents here today for a follow up visit and medication refill. Patient has not been taking her BP medications as prescribed. According to her husband who is here with patient today, she takes her medications occasionally and not at a regular time of the day. And she takes her labetalol only once a day when she takes it. She has no complaint today. She denies depression, she denies any domestic stressor. Patient has No headache, No chest pain, No abdominal pain - No Nausea, No new weakness tingling or numbness, No Cough - SOB.  Problem  Absolute Anemia    ALLERGIES: No Known Allergies  PAST MEDICAL HISTORY: Past Medical History:  Diagnosis Date  . Heartburn in pregnancy   . Hypertension   . Maternal anemia in pregnancy, antepartum 02/18/2015    MEDICATIONS AT HOME: Prior to Admission medications   Medication Sig Start Date End Date Taking? Authorizing Provider  ferrous sulfate 325 (65 FE) MG tablet Take 1 tablet (325 mg total) by mouth 2 (two) times daily with a meal. 06/30/17  Yes Mattheu Brodersen E, MD  labetalol (NORMODYNE) 300 MG tablet Take 1 tablet (300 mg total) by mouth 2 (two) times daily. 06/30/17  Yes Tresa Garter, MD  lisinopril-hydrochlorothiazide (PRINZIDE,ZESTORETIC) 20-25 MG tablet Take 1 tablet by mouth daily. 06/30/17  Yes Tresa Garter, MD  pantoprazole (PROTONIX) 40 MG tablet Take 1 tablet (40 mg total) by mouth daily. 12/23/16  Yes Tresa Garter, MD  pravastatin (PRAVACHOL) 20 MG tablet Take 1 tablet (20 mg total) by mouth daily. 06/30/17  Yes Tresa Garter, MD  zolpidem (AMBIEN) 5 MG tablet Take 1 tablet (5 mg total) by mouth at bedtime as needed for sleep. 12/23/16  Yes Delisa Finck,  Marlena Clipper, MD  acetaminophen-codeine (TYLENOL #3) 300-30 MG tablet Take 1 tablet by mouth every 4 (four) hours as needed. Patient not taking: Reported on 12/23/2016 05/14/16   Tresa Garter, MD  Prenatal Vit-Fe Fumarate-FA (PRENATAL MULTIVITAMIN) TABS tablet Take 1 tablet by mouth daily at 12 noon. Patient not taking: Reported on 05/14/2016 05/27/15   Standard, Venus, CNM    Objective:   Vitals:   06/30/17 1340 06/30/17 1430  BP: (!) 194/108 (!) 160/102  Pulse: 75   Resp: 18   Temp: 99.4 F (37.4 C)   TempSrc: Oral   SpO2: 96%   Weight: 156 lb (70.8 kg)   Height: 5\' 7"  (1.702 m)    Exam General appearance : Awake, alert, not in any distress. Speech Clear. Not toxic looking HEENT: Atraumatic and Normocephalic, pupils equally reactive to light and accomodation Neck: Supple, no JVD. No cervical lymphadenopathy.  Chest: Good air entry bilaterally, no added sounds  CVS: S1 S2 regular, no murmurs.  Abdomen: Bowel sounds present, Non tender and not distended with no gaurding, rigidity or rebound. Extremities: B/L Lower Ext shows no edema, both legs are warm to touch Neurology: Awake alert, and oriented X 3, CN II-XII intact, Non focal Skin: No Rash  Data Review Lab Results  Component Value Date   HGBA1C 5.4 12/23/2016    Assessment & Plan   1. Uncontrolled hypertension due to non-adherence  - labetalol (NORMODYNE) 300 MG tablet; Take 1  tablet (300 mg total) by mouth 2 (two) times daily.  Dispense: 180 tablet; Refill: 3 - lisinopril-hydrochlorothiazide (PRINZIDE,ZESTORETIC) 20-25 MG tablet; Take 1 tablet by mouth daily.  Dispense: 90 tablet; Refill: 3  Extensively educated again and counseled about medication adherence and the consequences of uncontrolled medications. It is difficult to adjust medications when patient is not taking it the way it was prescribed.  2. Dyslipidemia  - pravastatin (PRAVACHOL) 20 MG tablet; Take 1 tablet (20 mg total) by mouth daily.   Dispense: 90 tablet; Refill: 3  3. Other iron deficiency anemia  - ferrous sulfate 325 (65 FE) MG tablet; Take 1 tablet (325 mg total) by mouth 2 (two) times daily with a meal.  Dispense: 180 tablet; Refill: 3  Patient have been counseled extensively about nutrition and exercise. Other issues discussed during this visit include: low cholesterol diet, weight control and daily exercise, importance of adherence with medications and regular follow-up. We also discussed long term complications of uncontrolled hypertension.   Return in about 4 weeks (around 07/28/2017) for Follow up HTN, Routine Follow Up.  The patient was given clear instructions to go to ER or return to medical center if symptoms don't improve, worsen or new problems develop. The patient verbalized understanding. The patient was told to call to get lab results if they haven't heard anything in the next week.   This note has been created with Surveyor, quantity. Any transcriptional errors are unintentional.    Angelica Chessman, MD, MHA, Karilyn Cota, [place] [facility] [place], [place]   06/30/2017, 2:34 PM

## 2017-07-02 ENCOUNTER — Telehealth: Payer: Self-pay | Admitting: *Deleted

## 2017-07-02 NOTE — Telephone Encounter (Signed)
-----   Message from Tresa Garter, MD sent at 06/21/2017 12:07 PM EST ----- Please inform patient that her screening mammogram shows no evidence of malignancy. Recommend screening mammogram in one year

## 2017-07-02 NOTE — Telephone Encounter (Signed)
Patient verified DOB Patient is aware of mammogram screening being negative for malignancy and a recommended screening be completed in one year. No further questions.

## 2017-07-11 ENCOUNTER — Other Ambulatory Visit: Payer: Self-pay | Admitting: Internal Medicine

## 2017-07-11 DIAGNOSIS — I10 Essential (primary) hypertension: Secondary | ICD-10-CM

## 2017-12-09 ENCOUNTER — Encounter: Payer: Self-pay | Admitting: Family Medicine

## 2017-12-09 ENCOUNTER — Ambulatory Visit: Payer: BLUE CROSS/BLUE SHIELD | Attending: Family Medicine | Admitting: Family Medicine

## 2017-12-09 VITALS — BP 190/108 | HR 68 | Temp 98.3°F | Ht 67.0 in | Wt 150.8 lb

## 2017-12-09 DIAGNOSIS — Z79899 Other long term (current) drug therapy: Secondary | ICD-10-CM | POA: Insufficient documentation

## 2017-12-09 DIAGNOSIS — Z9889 Other specified postprocedural states: Secondary | ICD-10-CM | POA: Insufficient documentation

## 2017-12-09 DIAGNOSIS — M546 Pain in thoracic spine: Secondary | ICD-10-CM | POA: Insufficient documentation

## 2017-12-09 DIAGNOSIS — I10 Essential (primary) hypertension: Secondary | ICD-10-CM | POA: Diagnosis not present

## 2017-12-09 DIAGNOSIS — M62838 Other muscle spasm: Secondary | ICD-10-CM | POA: Diagnosis not present

## 2017-12-09 DIAGNOSIS — R001 Bradycardia, unspecified: Secondary | ICD-10-CM | POA: Insufficient documentation

## 2017-12-09 MED ORDER — LABETALOL HCL 300 MG PO TABS: 300.0000 mg | ORAL_TABLET | Freq: Two times a day (BID) | ORAL | 1 refills | Status: DC

## 2017-12-09 MED ORDER — LISINOPRIL-HYDROCHLOROTHIAZIDE 20-12.5 MG PO TABS: 2.0000 | ORAL_TABLET | Freq: Every day | ORAL | 1 refills | Status: DC

## 2017-12-09 MED ORDER — TIZANIDINE HCL 4 MG PO TABS: 4.0000 mg | ORAL_TABLET | Freq: Three times a day (TID) | ORAL | 3 refills | Status: DC | PRN

## 2017-12-09 NOTE — Patient Instructions (Signed)

## 2017-12-09 NOTE — Progress Notes (Signed)
Subjective:  Patient ID: Megan Baxter, female    DOB: 1971-01-23  Age: 47 y.o. MRN: [id number]  CC: Establish Care and Hypertension   HPI Megan Baxter is a 47 year old female with a history of hypertension who presents today to establish care with me, accompanied by her husband.  Her blood pressure is significantly elevated at 190/108 and she endorses taking labetalol but ran out of lisinopril/HCTZ. Her husband provides part of the history and serves as the interpreter; he states her blood pressure remains elevated even while complying with both medications. She is not adherent with a low-sodium diet or exercise. She also complains of intermittent chronic left upper back pain which sometimes radiates to her shoulder and her anterior chest wall with no preceding history of trauma.  Pain is worse when she elevates her left arm or when she does house chores for prolonged period of time.  Past Medical History:  Diagnosis Date  . Heartburn in pregnancy   . Hypertension   . Maternal anemia in pregnancy, antepartum 02/18/2015    Past Surgical History:  Procedure Laterality Date  . CESAREAN SECTION N/A 04/26/2013   Procedure: CESAREAN SECTION;  Surgeon: Alwyn Pea, MD;  Location: [facility];  Service: Obstetrics;  Laterality: N/A;  . CESAREAN SECTION N/A 05/24/2015   Procedure: CESAREAN SECTION;  Surgeon: Everett Graff, MD;  Location: [facility];  Service: Obstetrics;  Laterality: N/A;  . NO PAST SURGERIES      No Known Allergies   Outpatient Medications Prior to Visit  Medication Sig Dispense Refill  . labetalol (NORMODYNE) 300 MG tablet Take 1 tablet (300 mg total) by mouth 2 (two) times daily. 180 tablet 3  . acetaminophen-codeine (TYLENOL #3) 300-30 MG tablet Take 1 tablet by mouth every 4 (four) hours as needed. (Patient not taking: Reported on 12/23/2016) 60 tablet 0  . ferrous sulfate 325 (65 FE) MG tablet Take 1 tablet (325 mg total) by mouth 2 (two) times daily with a meal.  (Patient not taking: Reported on 12/09/2017) 180 tablet 3  . pantoprazole (PROTONIX) 40 MG tablet Take 1 tablet (40 mg total) by mouth daily. (Patient not taking: Reported on 12/09/2017) 30 tablet 3  . pravastatin (PRAVACHOL) 20 MG tablet Take 1 tablet (20 mg total) by mouth daily. (Patient not taking: Reported on 12/09/2017) 90 tablet 3  . Prenatal Vit-Fe Fumarate-FA (PRENATAL MULTIVITAMIN) TABS tablet Take 1 tablet by mouth daily at 12 noon. (Patient not taking: Reported on 05/14/2016) 60 tablet 3  . lisinopril-hydrochlorothiazide (PRINZIDE,ZESTORETIC) 20-25 MG tablet TAKE 1 TABLET DAILY (Patient not taking: Reported on 12/09/2017) 90 tablet 3  . zolpidem (AMBIEN) 5 MG tablet Take 1 tablet (5 mg total) by mouth at bedtime as needed for sleep. (Patient not taking: Reported on 12/09/2017) 30 tablet 1   No facility-administered medications prior to visit.     ROS Review of Systems  Constitutional: Negative for activity change, appetite change and fatigue.  HENT: Negative for congestion, sinus pressure and sore throat.   Eyes: Negative for visual disturbance.  Respiratory: Negative for cough, chest tightness, shortness of breath and wheezing.   Cardiovascular: Negative for chest pain and palpitations.  Gastrointestinal: Negative for abdominal distention, abdominal pain and constipation.  Endocrine: Negative for polydipsia.  Genitourinary: Negative for dysuria and frequency.  Musculoskeletal:       See hpi  Skin: Negative for rash.  Neurological: Negative for tremors, light-headedness and numbness.  Hematological: Does not bruise/bleed easily.  Psychiatric/Behavioral: Negative for agitation and behavioral problems.  Objective:  BP (!) 190/108   Pulse 68   Temp 98.3 F (36.8 C) (Oral)   Ht '5\' 7"'  (1.702 m)   Wt 150 lb 12.8 oz (68.4 kg)   SpO2 98%   BMI 23.62 kg/m   BP/Weight 12/09/2017 06/30/2017 7/36/6815  Systolic BP 947 076 151  Diastolic BP 834 373 578  Wt. (Lbs) 150.8 156 161.6    BMI 23.62 24.43 28.63      Physical Exam  Constitutional: She is oriented to person, place, and time. She appears well-developed and well-nourished.  Cardiovascular: Normal rate, normal heart sounds and intact distal pulses.  No murmur heard. Pulmonary/Chest: Effort normal and breath sounds normal. She has no wheezes. She has no rales. She exhibits no tenderness.  Abdominal: Soft. Bowel sounds are normal. She exhibits no distension and no mass. There is no tenderness.  Musculoskeletal: Normal range of motion. She exhibits tenderness (TTP of left scapular and on elevation of left forearm).  Neurological: She is alert and oriented to person, place, and time.  Skin: Skin is warm and dry.     Assessment & Plan:   1. Uncontrolled hypertension Due to running out of medication Unable to administer clonidine due to low heart rate Increased dose of lisinopril/HCTZ I will reassess at next visit Low sodium, DASH diet - lisinopril-hydrochlorothiazide (ZESTORETIC) 20-12.5 MG tablet; Take 2 tablets by mouth daily.  Dispense: 180 tablet; Refill: 1 - CMP14+EGFR - Lipid panel - labetalol (NORMODYNE) 300 MG tablet; Take 1 tablet (300 mg total) by mouth 2 (two) times daily.  Dispense: 180 tablet; Refill: 1  2. Muscle spasm Advised to apply heat - tiZANidine (ZANAFLEX) 4 MG tablet; Take 1 tablet (4 mg total) by mouth every 8 (eight) hours as needed for muscle spasms.  Dispense: 90 tablet; Refill: 3   Meds ordered this encounter  Medications  . lisinopril-hydrochlorothiazide (ZESTORETIC) 20-12.5 MG tablet    Sig: Take 2 tablets by mouth daily.    Dispense:  180 tablet    Refill:  1    Discontinue previous dose  . tiZANidine (ZANAFLEX) 4 MG tablet    Sig: Take 1 tablet (4 mg total) by mouth every 8 (eight) hours as needed for muscle spasms.    Dispense:  90 tablet    Refill:  3  . labetalol (NORMODYNE) 300 MG tablet    Sig: Take 1 tablet (300 mg total) by mouth 2 (two) times daily.     Dispense:  180 tablet    Refill:  1    Follow-up: Return in about 1 month (around 01/09/2018) for complete physical exam.   Charlott Rakes MD

## 2017-12-10 LAB — CMP14+EGFR
ALT: 14 IU/L (ref 0–32)
AST: 12 IU/L (ref 0–40)
Albumin/Globulin Ratio: 1.5 (ref 1.2–2.2)
Albumin: 4.3 g/dL (ref 3.5–5.5)
Alkaline Phosphatase: 82 IU/L (ref 39–117)
BILIRUBIN TOTAL: 0.3 mg/dL (ref 0.0–1.2)
BUN/Creatinine Ratio: 18 (ref 9–23)
BUN: 13 mg/dL (ref 6–24)
CHLORIDE: 104 mmol/L (ref 96–106)
CO2: 20 mmol/L (ref 20–29)
Calcium: 9.7 mg/dL (ref 8.7–10.2)
Creatinine, Ser: 0.74 mg/dL (ref 0.57–1.00)
GFR calc Af Amer: 113 mL/min/{1.73_m2} (ref >59)
GFR calc non Af Amer: 98 mL/min/{1.73_m2} (ref >59)
Globulin, Total: 2.9 g/dL (ref 1.5–4.5)
Glucose: 103 mg/dL — ABNORMAL HIGH (ref 65–99)
Potassium: 4.1 mmol/L (ref 3.5–5.2)
Sodium: 138 mmol/L (ref 134–144)
Total Protein: 7.2 g/dL (ref 6.0–8.5)

## 2017-12-10 LAB — LIPID PANEL
CHOLESTEROL TOTAL: 204 mg/dL — AB (ref 100–199)
Chol/HDL Ratio: 3.6 ratio (ref 0.0–4.4)
HDL: 56 mg/dL (ref >39)
LDL CALC: 139 mg/dL — AB (ref 0–99)
TRIGLYCERIDES: 45 mg/dL (ref 0–149)
VLDL CHOLESTEROL CAL: 9 mg/dL (ref 5–40)

## 2017-12-13 ENCOUNTER — Other Ambulatory Visit: Payer: Self-pay | Admitting: Family Medicine

## 2017-12-13 MED ORDER — ATORVASTATIN CALCIUM 20 MG PO TABS: 20.0000 mg | ORAL_TABLET | Freq: Every day | ORAL | 1 refills | Status: DC

## 2017-12-16 ENCOUNTER — Telehealth: Payer: Self-pay

## 2017-12-16 NOTE — Telephone Encounter (Signed)
Patient was called and informed of lab results via interpretor. 

## 2017-12-16 NOTE — Telephone Encounter (Signed)
-----   Message from Charlott Rakes, MD sent at 12/13/2017 11:56 AM EDT ----- Cholesterol is elevated, and have sent a prescription for atorvastatin to her pharmacy.  Other labs are stable.

## 2018-01-21 ENCOUNTER — Encounter

## 2018-02-03 ENCOUNTER — Ambulatory Visit: Payer: BLUE CROSS/BLUE SHIELD | Attending: Family Medicine | Admitting: Family Medicine

## 2018-02-03 ENCOUNTER — Encounter: Payer: Self-pay | Admitting: Family Medicine

## 2018-02-03 VITALS — BP 206/110 | HR 67 | Temp 98.6°F | Ht 67.0 in | Wt 156.6 lb

## 2018-02-03 DIAGNOSIS — Z1231 Encounter for screening mammogram for malignant neoplasm of breast: Secondary | ICD-10-CM

## 2018-02-03 DIAGNOSIS — Z0000 Encounter for general adult medical examination without abnormal findings: Secondary | ICD-10-CM | POA: Diagnosis present

## 2018-02-03 DIAGNOSIS — Z1239 Encounter for other screening for malignant neoplasm of breast: Secondary | ICD-10-CM

## 2018-02-03 DIAGNOSIS — Z79899 Other long term (current) drug therapy: Secondary | ICD-10-CM | POA: Diagnosis not present

## 2018-02-03 DIAGNOSIS — I10 Essential (primary) hypertension: Secondary | ICD-10-CM | POA: Diagnosis not present

## 2018-02-03 MED ORDER — LABETALOL HCL 300 MG PO TABS: 600.0000 mg | ORAL_TABLET | Freq: Two times a day (BID) | ORAL | 3 refills | Status: DC

## 2018-02-03 MED ORDER — CLONIDINE HCL 0.1 MG PO TABS
0.1000 mg | ORAL_TABLET | Freq: Once | ORAL | Status: AC
  Administered 2018-02-03: 0.1 mg via ORAL

## 2018-02-03 NOTE — Progress Notes (Signed)
Subjective:  Patient ID: Briant Cedar, female    DOB: 1970-10-19  Age: 47 y.o. MRN: [id number]  CC: Hypertension   HPI Abir Bjorn presents for complete physical exam and her blood pressure is currently elevated.  She endorses compliant with her antihypertensive but takes lisinopril/HCTZ 1 tablet twice a day rather than 2 tablets daily in the morning. She denies headaches, blurry vision. Clonidine 0.1 mg administered. Last Pap smear was in 11/2016 and was normal.  Past Medical History:  Diagnosis Date  . Heartburn in pregnancy   . Hypertension   . Maternal anemia in pregnancy, antepartum 02/18/2015    Past Surgical History:  Procedure Laterality Date  . CESAREAN SECTION N/A 04/26/2013   Procedure: CESAREAN SECTION;  Surgeon: Alwyn Pea, MD;  Location: [facility];  Service: Obstetrics;  Laterality: N/A;  . CESAREAN SECTION N/A 05/24/2015   Procedure: CESAREAN SECTION;  Surgeon: Everett Graff, MD;  Location: [facility];  Service: Obstetrics;  Laterality: N/A;  . NO PAST SURGERIES      No Known Allergies   Outpatient Medications Prior to Visit  Medication Sig Dispense Refill  . atorvastatin (LIPITOR) 20 MG tablet Take 1 tablet (20 mg total) by mouth daily. 90 tablet 1  . ferrous sulfate 325 (65 FE) MG tablet Take 1 tablet (325 mg total) by mouth 2 (two) times daily with a meal. 180 tablet 3  . lisinopril-hydrochlorothiazide (ZESTORETIC) 20-12.5 MG tablet Take 2 tablets by mouth daily. 180 tablet 1  . labetalol (NORMODYNE) 300 MG tablet Take 1 tablet (300 mg total) by mouth 2 (two) times daily. 180 tablet 1  . acetaminophen-codeine (TYLENOL #3) 300-30 MG tablet Take 1 tablet by mouth every 4 (four) hours as needed. (Patient not taking: Reported on 12/23/2016) 60 tablet 0  . pantoprazole (PROTONIX) 40 MG tablet Take 1 tablet (40 mg total) by mouth daily. (Patient not taking: Reported on 12/09/2017) 30 tablet 3  . Prenatal Vit-Fe Fumarate-FA (PRENATAL MULTIVITAMIN) TABS tablet Take 1  tablet by mouth daily at 12 noon. (Patient not taking: Reported on 05/14/2016) 60 tablet 3  . tiZANidine (ZANAFLEX) 4 MG tablet Take 1 tablet (4 mg total) by mouth every 8 (eight) hours as needed for muscle spasms. (Patient not taking: Reported on 02/03/2018) 90 tablet 3  . pravastatin (PRAVACHOL) 20 MG tablet Take 1 tablet (20 mg total) by mouth daily. (Patient not taking: Reported on 12/09/2017) 90 tablet 3   No facility-administered medications prior to visit.     ROS Review of Systems  Constitutional: Negative for activity change, appetite change and fatigue.  HENT: Negative for congestion, sinus pressure and sore throat.   Eyes: Negative for visual disturbance.  Respiratory: Negative for cough, chest tightness, shortness of breath and wheezing.   Cardiovascular: Negative for chest pain and palpitations.  Gastrointestinal: Negative for abdominal distention, abdominal pain and constipation.  Endocrine: Negative for polydipsia.  Genitourinary: Negative for dysuria and frequency.  Musculoskeletal: Negative for arthralgias and back pain.  Skin: Negative for rash.  Neurological: Negative for tremors, light-headedness and numbness.  Hematological: Does not bruise/bleed easily.  Psychiatric/Behavioral: Negative for agitation and behavioral problems.    Objective:  BP (!) 206/110   Pulse 67   Temp 98.6 F (37 C) (Oral)   Ht 5\' 7"  (1.702 m)   Wt 156 lb 9.6 oz (71 kg)   SpO2 99%   BMI 24.53 kg/m   BP/Weight 02/03/2018 12/09/2017 16/0/7371  Systolic BP 062 694 854  Diastolic BP 627 035 009  Wt. (Lbs) 156.6 150.8 156  BMI 24.53 23.62 24.43      Physical Exam  Constitutional: She is oriented to person, place, and time. She appears well-developed and well-nourished. No distress.  HENT:  Head: Normocephalic.  Right Ear: External ear normal.  Left Ear: External ear normal.  Nose: Nose normal.  Mouth/Throat: Oropharynx is clear and moist.  Eyes: Pupils are equal, round, and  reactive to light. Conjunctivae and EOM are normal.  Neck: Normal range of motion. No JVD present.  Cardiovascular: Normal rate, regular rhythm, normal heart sounds and intact distal pulses. Exam reveals no gallop.  No murmur heard. Pulmonary/Chest: Effort normal and breath sounds normal. No respiratory distress. She has no wheezes. She has no rales. She exhibits no tenderness. Right breast exhibits no mass and no tenderness. Left breast exhibits no mass and no tenderness.  Abdominal: Soft. Bowel sounds are normal. She exhibits no distension and no mass. There is no tenderness.  Musculoskeletal: Normal range of motion. She exhibits no edema or tenderness.  Neurological: She is alert and oriented to person, place, and time. She has normal reflexes.  Skin: Skin is warm and dry. She is not diaphoretic.  Psychiatric: She has a normal mood and affect.     Assessment & Plan:   1. Annual physical exam Counseled on 150 minutes of exercise per week, healthy eating (including decreased daily intake of saturated fats, cholesterol, added sugars, sodium), STI prevention, routine healthcare maintenance.   2. Uncontrolled hypertension Dose of labetalol increased Clonidine 0.1 mg administered in blood pressure repeated after 30 minutes Counseled on blood pressure goal of less than 130/80, low-sodium, DASH diet, medication compliance, 150 minutes of moderate intensity exercise per week. Discussed medication compliance, adverse effects. - cloNIDine (CATAPRES) tablet 0.1 mg - labetalol (NORMODYNE) 300 MG tablet; Take 2 tablets (600 mg total) by mouth 2 (two) times daily.  Dispense: 120 tablet; Refill: 3  3. Screening for breast cancer - MM Digital Screening; Future   Meds ordered this encounter  Medications  . DISCONTD: labetalol (NORMODYNE) 300 MG tablet    Sig: Take 2 tablets (600 mg total) by mouth 2 (two) times daily.    Dispense:  120 tablet    Refill:  3    Discontinue previous dose  .  cloNIDine (CATAPRES) tablet 0.1 mg  . labetalol (NORMODYNE) 300 MG tablet    Sig: Take 2 tablets (600 mg total) by mouth 2 (two) times daily.    Dispense:  120 tablet    Refill:  3    Discontinue previous dose    Follow-up: Return in about 6 weeks (around 03/17/2018) for Follow-up of hypertension.   Charlott Rakes MD

## 2018-02-03 NOTE — Patient Instructions (Signed)

## 2018-03-30 ENCOUNTER — Ambulatory Visit: Payer: BLUE CROSS/BLUE SHIELD | Admitting: Nurse Practitioner

## 2018-03-30 ENCOUNTER — Ambulatory Visit: Payer: BLUE CROSS/BLUE SHIELD | Admitting: Family Medicine

## 2018-05-03 ENCOUNTER — Other Ambulatory Visit: Payer: Self-pay

## 2018-05-04 ENCOUNTER — Ambulatory Visit (INDEPENDENT_AMBULATORY_CARE_PROVIDER_SITE_OTHER): Payer: BLUE CROSS/BLUE SHIELD | Admitting: Family Medicine

## 2018-05-04 ENCOUNTER — Telehealth: Payer: Self-pay | Admitting: Family Medicine

## 2018-05-04 ENCOUNTER — Encounter: Payer: Self-pay | Admitting: Family Medicine

## 2018-05-04 ENCOUNTER — Other Ambulatory Visit: Payer: Self-pay | Admitting: Family Medicine

## 2018-05-04 VITALS — BP 167/115 | HR 69 | Temp 98.9°F | Resp 17 | Ht 67.0 in | Wt 153.2 lb

## 2018-05-04 DIAGNOSIS — N63 Unspecified lump in unspecified breast: Secondary | ICD-10-CM

## 2018-05-04 DIAGNOSIS — I10 Essential (primary) hypertension: Secondary | ICD-10-CM

## 2018-05-04 MED ORDER — AMLODIPINE BESYLATE 5 MG PO TABS: 5.0000 mg | ORAL_TABLET | Freq: Every day | ORAL | 3 refills | Status: DC

## 2018-05-04 NOTE — Progress Notes (Addendum)
Patient ID: Megan Baxter, female    DOB: November 03, 1970, 47 y.o.   MRN: [id number]  PCP: Scot Jun, FNP  Chief Complaint  Patient presents with  . Arm Pain    L arm x off and on x years. no previous injury. states that the pain radiates to her neck & back as well.    Subjective:  HPI Megan Baxter is a 46 y.o. female presents for evaluation left arm and shoulder pain. On arrival blood pressure markedly elevated. 189/119. She took her home blood pressure medication one hour prior to arrival. Complains of atypical pain that originates in her back and radiates to the left lower chest. Has been present for sometime. No associated shortness of breath, chest tightness, or diaphoresis. She doesn't monitor blood pressure at home and reports compliance with daily medications. In review of EMR hypertension has been a long standing problem. She has seen cardiology previously several years ago in her home country and reports cardiology never uncovered an underlying cause of hypertension. She has not see a cardiologist on the [place].  Social History   Socioeconomic History  . Marital status: Married    Spouse name: Not on file  . Number of children: Not on file  . Years of education: Not on file  . Highest education level: Not on file  Occupational History  . Not on file  Social Needs  . Financial resource strain: Not on file  . Food insecurity:    Worry: Not on file    Inability: Not on file  . Transportation needs:    Medical: Not on file    Non-medical: Not on file  Tobacco Use  . Smoking status: Never Smoker  . Smokeless tobacco: Never Used  Substance and Sexual Activity  . Alcohol use: No  . Drug use: No  . Sexual activity: Yes  Lifestyle  . Physical activity:    Days per week: Not on file    Minutes per session: Not on file  . Stress: Not on file  Relationships  . Social connections:    Talks on phone: Not on file    Gets together: Not on file    Attends religious service:  Not on file    Active member of club or organization: Not on file    Attends meetings of clubs or organizations: Not on file    Relationship status: Not on file  . Intimate partner violence:    Fear of current or ex partner: Not on file    Emotionally abused: Not on file    Physically abused: Not on file    Forced sexual activity: Not on file  Other Topics Concern  . Not on file  Social History Narrative  . Not on file    Family History  Problem Relation Age of Onset  . Hypertension Father   . Alcohol abuse Neg Hx   . Arthritis Neg Hx   . Asthma Neg Hx   . Birth defects Neg Hx   . Cancer Neg Hx   . COPD Neg Hx   . Depression Neg Hx   . Diabetes Neg Hx   . Drug abuse Neg Hx   . Early death Neg Hx   . Hearing loss Neg Hx   . Heart disease Neg Hx   . Hyperlipidemia Neg Hx   . Kidney disease Neg Hx   . Learning disabilities Neg Hx   . Mental illness Neg Hx   . Mental retardation Neg Hx   .  Miscarriages / Stillbirths Neg Hx   . Stroke Neg Hx   . Vision loss Neg Hx    Review of Systems  Pertinent negatives listed in HPI Patient Active Problem List   Diagnosis Date Noted  . Primary insomnia 12/23/2016  . Dyslipidemia 05/14/2016  . Uncontrolled hypertension 11/21/2015  . Absolute anemia 05/23/2015    No Known Allergies  Prior to Admission medications   Medication Sig Start Date End Date Taking? Authorizing Provider  atorvastatin (LIPITOR) 20 MG tablet Take 1 tablet (20 mg total) by mouth daily. 12/13/17  Yes Charlott Rakes, MD  ferrous sulfate 325 (65 FE) MG tablet Take 1 tablet (325 mg total) by mouth 2 (two) times daily with a meal. 06/30/17  Yes Jegede, Olugbemiga E, MD  labetalol (NORMODYNE) 300 MG tablet Take 2 tablets (600 mg total) by mouth 2 (two) times daily. 02/03/18  Yes Charlott Rakes, MD  lisinopril-hydrochlorothiazide (ZESTORETIC) 20-12.5 MG tablet Take 2 tablets by mouth daily. 12/09/17  Yes Charlott Rakes, MD    Past Medical, Surgical Family and  Social History reviewed and updated.    Objective:   Today's Vitals   05/04/18 1115 05/04/18 1128  BP: (!) 189/119 (!) 167/115  Pulse: 69   Resp: 17   Temp: 98.9 F (37.2 C)   TempSrc: Oral   SpO2: 98%   Weight: 153 lb 3.2 oz (69.5 kg)   Height: 5\' 7"  (1.702 m)     Wt Readings from Last 3 Encounters:  05/04/18 153 lb 3.2 oz (69.5 kg)  02/03/18 156 lb 9.6 oz (71 kg)  12/09/17 150 lb 12.8 oz (68.4 kg)    Physical Exam  Constitutional: She is oriented to person, place, and time. She appears well-developed and well-nourished.  Neck: Carotid bruit is present.  Bilateral bruits noted on exam of the carotid arteries.  Cardiovascular: Normal rate, regular rhythm and normal heart sounds.  Pulmonary/Chest: Effort normal and breath sounds normal.  Musculoskeletal: She exhibits no edema.  Lymphadenopathy:    She has no cervical adenopathy.  Neurological: She is alert and oriented to person, place, and time.   Lab Results  Component Value Date   HGBA1C 5.4 12/23/2016         Assessment & Plan:  1. Essential hypertension, accelerated Review of medical record patient has a long history of uncontrolled hypertension. She has been managed on multiple medications without significant improvement of blood pressure.  Presents today with some atypical to shoulder pain radiating toward her chest.  EKG completed in 2017 which was significance for some LVH and specific T wave abnormality.  EKG or emergency meds available today in clinic.  Will refer patient emergently to cardiology for evaluation he seen in the hypertension clinic.  That amlodipine to current regimen and advised to start medication today.  Will return tomorrow for blood pressure recheck.  To follow-up here in the office if no appointment with cardiology by Monday.  Given instructions to go to the ED if symptoms worsens or change. - Ambulatory referral to Cardiology  2. Breast nodule in the left breast. On exam there is a of a  mobile breast nodule at the 6-7 o'clock region of the left breast. Complains of intermittent left breast pain.  Will obtain diagnostic breast imaging to rule out  malignancy. - MM DIAG BREAST TOMO UNI LEFT; Future   Meds ordered this encounter  Medications  . amLODipine (NORVASC) 5 MG tablet    Sig: Take 1 tablet (5 mg total) by mouth  daily.    Dispense:  90 tablet    Refill:  3    Orders Placed This Encounter  Procedures  . MM DIAG BREAST TOMO UNI LEFT    [employer]//CO SIGN REQ 05/04/18 PF @ [facility] 06/21/17//BREAST NODULE LEFT BREAST//NO NEEDS/SB W/PTS HUSBAND    Standing Status:   Future    Standing Expiration Date:   07/05/2019    Order Specific Question:   Reason for Exam (SYMPTOM  OR DIAGNOSIS REQUIRED)    Answer:   BREAST NODULE    Order Specific Question:   Is the patient pregnant?    Answer:   No    Order Specific Question:   Preferred imaging location?    Answer:   [facility]  . Ambulatory referral to Cardiology    Referral Priority:   Urgent    Referral Type:   Consultation    Referral Reason:   Specialty Services Required    Requested Specialty:   Cardiology    Number of Visits Requested:   1     -The patient was given clear instructions to go to ER or return to medical center if symptoms do not improve, worsen or new problems develop. The patient verbalized understanding.       Molli Barrows, FNP Primary Care at [facility] [address], [place]: [phone]

## 2018-05-04 NOTE — Telephone Encounter (Signed)
Erroneous

## 2018-05-04 NOTE — Patient Instructions (Addendum)
Contact breast center to schedule your breast exam.   Phone: [phone]  Cardiology will contact you for an appointment.  If you experience any worsening of symptoms go immediately to the ER.   Return for blood pressure check tomorrow. Take all medication prior to arrival.      Hypertension Hypertension is another name for high blood pressure. High blood pressure forces your heart to work harder to pump blood. This can cause problems over time. There are two numbers in a blood pressure reading. There is a top number (systolic) over a bottom number (diastolic). It is best to have a blood pressure below 120/80. Healthy choices can help lower your blood pressure. You may need medicine to help lower your blood pressure if:  Your blood pressure cannot be lowered with healthy choices.  Your blood pressure is higher than 130/80.  Follow these instructions at home: Eating and drinking  If directed, follow the DASH eating plan. This diet includes: ? Filling half of your plate at each meal with fruits and vegetables. ? Filling one quarter of your plate at each meal with whole grains. Whole grains include whole wheat pasta, brown rice, and whole grain bread. ? Eating or drinking low-fat dairy products, such as skim milk or low-fat yogurt. ? Filling one quarter of your plate at each meal with low-fat (lean) proteins. Low-fat proteins include fish, skinless chicken, eggs, beans, and tofu. ? Avoiding fatty meat, cured and processed meat, or chicken with skin. ? Avoiding premade or processed food.  Eat less than 1,500 mg of salt (sodium) a day.  Limit alcohol use to no more than 1 drink a day for nonpregnant women and 2 drinks a day for men. One drink equals 12 oz of beer, 5 oz of wine, or 1 oz of hard liquor. Lifestyle  Work with your doctor to stay at a healthy weight or to lose weight. Ask your doctor what the best weight is for you.  Get at least 30 minutes of exercise that causes  your heart to beat faster (aerobic exercise) most days of the week. This may include walking, swimming, or biking.  Get at least 30 minutes of exercise that strengthens your muscles (resistance exercise) at least 3 days a week. This may include lifting weights or pilates.  Do not use any products that contain nicotine or tobacco. This includes cigarettes and e-cigarettes. If you need help quitting, ask your doctor.  Check your blood pressure at home as told by your doctor.  Keep all follow-up visits as told by your doctor. This is important. Medicines  Take over-the-counter and prescription medicines only as told by your doctor. Follow directions carefully.  Do not skip doses of blood pressure medicine. The medicine does not work as well if you skip doses. Skipping doses also puts you at risk for problems.  Ask your doctor about side effects or reactions to medicines that you should watch for. Contact a doctor if:  You think you are having a reaction to the medicine you are taking.  You have headaches that keep coming back (recurring).  You feel dizzy.  You have swelling in your ankles.  You have trouble with your vision. Get help right away if:  You get a very bad headache.  You start to feel confused.  You feel weak or numb.  You feel faint.  You get very bad pain in your: ? Chest. ? Belly (abdomen).  You throw up (vomit) more than once.  You  have trouble breathing. Summary  Hypertension is another name for high blood pressure.  Making healthy choices can help lower blood pressure. If your blood pressure cannot be controlled with healthy choices, you may need to take medicine. This information is not intended to replace advice given to you by your health care provider. Make sure you discuss any questions you have with your health care provider. Document Released: 12/30/2007 Document Revised: 06/10/2016 Document Reviewed: 06/10/2016 Elsevier Interactive Patient  Education  [employer].

## 2018-05-05 ENCOUNTER — Telehealth: Payer: Self-pay | Admitting: Family Medicine

## 2018-05-05 MED ORDER — AMLODIPINE BESYLATE 5 MG PO TABS: 10.0000 mg | ORAL_TABLET | Freq: Every day | ORAL | 3 refills | Status: DC

## 2018-05-05 NOTE — Telephone Encounter (Signed)
See note below. Contact cardiology regarding referral placed two days ago.

## 2018-05-05 NOTE — Telephone Encounter (Signed)
Increased dose of medication Amlodipine 10 mg. BP remains elevated today. Return on Monday for BP check. We will follow-up with cardiology regarding referral.   Molli Barrows, FNP Primary Care at [facility] [address], [place] [phone]: [phone]

## 2018-05-06 NOTE — Telephone Encounter (Signed)
Spoke with office. They state that they don't currently have a referral coordinator. Internal referrals go to a queue & scheduling calls to make appointments. They have about 600 referrals in said queue at the moment.

## 2018-05-09 ENCOUNTER — Ambulatory Visit: Payer: BLUE CROSS/BLUE SHIELD | Admitting: Family Medicine

## 2018-05-10 ENCOUNTER — Ambulatory Visit (INDEPENDENT_AMBULATORY_CARE_PROVIDER_SITE_OTHER): Payer: BLUE CROSS/BLUE SHIELD | Admitting: Family Medicine

## 2018-05-10 ENCOUNTER — Ambulatory Visit
Admission: RE | Admit: 2018-05-10 | Discharge: 2018-05-10 | Disposition: A | Discharge status: Home / Self Care | Payer: BLUE CROSS/BLUE SHIELD | Source: Ambulatory Visit | Attending: Family Medicine | Admitting: Family Medicine

## 2018-05-10 VITALS — BP 129/83 | HR 66

## 2018-05-10 DIAGNOSIS — N63 Unspecified lump in unspecified breast: Secondary | ICD-10-CM

## 2018-05-10 DIAGNOSIS — I10 Essential (primary) hypertension: Secondary | ICD-10-CM

## 2018-05-10 NOTE — Progress Notes (Signed)
Patient here for nurse visit for BP check. Has taken all of her BP meds today.  Today's Vitals   05/10/18 1104  BP: 129/83  Pulse: 66   KWalker, CMA.

## 2018-05-10 NOTE — Progress Notes (Signed)
Patient was advised to continue on current dose of medication and await appt with cardiology.

## 2018-06-16 ENCOUNTER — Other Ambulatory Visit: Payer: Self-pay | Admitting: Family Medicine

## 2018-06-16 DIAGNOSIS — I10 Essential (primary) hypertension: Secondary | ICD-10-CM

## 2018-08-12 ENCOUNTER — Telehealth: Payer: Self-pay

## 2018-08-12 NOTE — Telephone Encounter (Signed)
Spoke with provider after seeing patient on the schedule for chest pain. She advised me to call patient to triage.  Attempted to contact patient to triage chest pain which is in the appointment notes for her visit on 08/16/2018. Call went straight to voicemail. Left voicemail using interpreter line.

## 2018-08-15 ENCOUNTER — Encounter: Payer: Self-pay | Admitting: Cardiology

## 2018-08-15 ENCOUNTER — Ambulatory Visit (INDEPENDENT_AMBULATORY_CARE_PROVIDER_SITE_OTHER): Payer: BLUE CROSS/BLUE SHIELD | Admitting: Cardiology

## 2018-08-15 VITALS — BP 160/100 | Ht 67.0 in | Wt 153.8 lb

## 2018-08-15 DIAGNOSIS — I10 Essential (primary) hypertension: Secondary | ICD-10-CM

## 2018-08-15 MED ORDER — SPIRONOLACTONE 25 MG PO TABS: 25.0000 mg | ORAL_TABLET | Freq: Every day | ORAL | 3 refills | Status: DC

## 2018-08-15 NOTE — Patient Instructions (Signed)
Medication Instructions:  Please start Spironolactone 25 once daily. Continue all other medications as listed.  If you need a refill on your cardiac medications before your next appointment, please call your pharmacy.   Lab work: Please have blood work 1 week after starting Spironolactone.  If you have labs (blood work) drawn today and your tests are completely normal, you will receive your results only by: Marland Kitchen MyChart Message (if you have MyChart) OR . A paper copy in the mail If you have any lab test that is abnormal or we need to change your treatment, we will call you to review the results.  Follow-Up: You are being referred to the Hypertension Clinic and should be seen in 2 to 3 weeks.  At [facility] you and your health needs are our priority.  As part of our continuing mission to provide you with exceptional heart care, we have created designated Provider Care Teams.  These Care Teams include your primary Cardiologist (physician) and Advanced Practice Providers (APPs -  Physician Assistants and Nurse Practitioners) who all work together to provide you with the care you need, when you need it. You will need a follow up appointment in 12 months.  Please call our office 2 months in advance to schedule this appointment.  You may see Candee Furbish, MD or one of the following Advanced Practice Providers on your designated Care Team:   Truitt Merle, NP Cecilie Kicks, NP . Kathyrn Drown, NP  Thank you for choosing [facility]!!

## 2018-08-15 NOTE — Progress Notes (Signed)
Cardiology Office Note:    Date:  08/16/2018   ID:  Megan Baxter, DOB Nov 22, 1970, MRN [id number]  PCP:  Scot Jun, FNP  Cardiologist:  Candee Furbish, MD  Electrophysiologist:  None   Referring MD: Scot Jun, FNP     History of Present Illness:    Megan Baxter is a 48 y.o. female evaluation of chest pain at the request of Molli Barrows, NP.  In review of office note from 05/04/2018-she was having markedly elevated blood pressure 189/119 and left arm and shoulder pain.  Pain originates in her back radiates to her left lower chest.  Longstanding.  She went to the cardiologist several years ago in her [place] country and they never uncovered anything unusual as an underlying cause for her hypertension.  EKG was reported with LVH and nonspecific T wave abnormality.  She had been on multiple medications without significant improvement in her blood pressure.  Cardiomegaly was noted on prior CT scan in 2017 of her abdomen.  Scattered iliac calcifications noted.  Currently she is chest pain-free.  Occasionally she will feel back pain that radiates to her front and underneath her left breast.  Does not appear to be exertional and can last several hours duration.  This is been a fairly chronic issue for her at times.  Denies any syncope, bleeding.  When asked about renal ultrasound, she does remember having this done previously.  Past Medical History:  Diagnosis Date  . Heartburn in pregnancy   . Hypertension   . Maternal anemia in pregnancy, antepartum 02/18/2015    Past Surgical History:  Procedure Laterality Date  . CESAREAN SECTION N/A 04/26/2013   Procedure: CESAREAN SECTION;  Surgeon: Alwyn Pea, MD;  Location: [facility];  Service: Obstetrics;  Laterality: N/A;  . CESAREAN SECTION N/A 05/24/2015   Procedure: CESAREAN SECTION;  Surgeon: Everett Graff, MD;  Location: [facility];  Service: Obstetrics;  Laterality: N/A;  . NO PAST SURGERIES      Current  Medications: Current Meds  Medication Sig  . amLODipine (NORVASC) 5 MG tablet Take 2 tablets (10 mg total) by mouth daily.  Marland Kitchen atorvastatin (LIPITOR) 20 MG tablet TAKE 1 TABLET BY MOUTH  DAILY  . ferrous sulfate 325 (65 FE) MG tablet Take 1 tablet (325 mg total) by mouth 2 (two) times daily with a meal.  . labetalol (NORMODYNE) 300 MG tablet Take 2 tablets (600 mg total) by mouth 2 (two) times daily.  Marland Kitchen lisinopril-hydrochlorothiazide (PRINZIDE,ZESTORETIC) 20-12.5 MG tablet TAKE 2 TABLETS BY MOUTH  DAILY     Allergies:   Patient has no known allergies.   Social History   Socioeconomic History  . Marital status: Married    Spouse name: Not on file  . Number of children: Not on file  . Years of education: Not on file  . Highest education level: Not on file  Occupational History  . Not on file  Social Needs  . Financial resource strain: Not on file  . Food insecurity:    Worry: Not on file    Inability: Not on file  . Transportation needs:    Medical: Not on file    Non-medical: Not on file  Tobacco Use  . Smoking status: Never Smoker  . Smokeless tobacco: Never Used  Substance and Sexual Activity  . Alcohol use: No  . Drug use: No  . Sexual activity: Yes  Lifestyle  . Physical activity:    Days per week: Not on file  Minutes per session: Not on file  . Stress: Not on file  Relationships  . Social connections:    Talks on phone: Not on file    Gets together: Not on file    Attends religious service: Not on file    Active member of club or organization: Not on file    Attends meetings of clubs or organizations: Not on file    Relationship status: Not on file  Other Topics Concern  . Not on file  Social History Narrative  . Not on file     Family History: The patient's family history includes Hypertension in her father. There is no history of Alcohol abuse, Arthritis, Asthma, Birth defects, Cancer, COPD, Depression, Diabetes, Drug abuse, Early death, Hearing loss,  Heart disease, Hyperlipidemia, Kidney disease, Learning disabilities, Mental illness, Mental retardation, Miscarriages / Stillbirths, Stroke, or Vision loss.  ROS:   Please see the history of present illness.     All other systems reviewed and are negative.  EKGs/Labs/Other Studies Reviewed:    The following studies were reviewed today: Prior office notes lab work EKG  EKG:  EKG is ordered today.  The ekg ordered today demonstrates sinus rhythm 67 no other abnormalities.  Recent Labs: 12/09/2017: ALT 14; BUN 13; Creatinine, Ser 0.74; Potassium 4.1; Sodium 138  Recent Lipid Panel    Component Value Date/Time   CHOL 204 (H) 12/09/2017 1243   TRIG 45 12/09/2017 1243   HDL 56 12/09/2017 1243   CHOLHDL 3.6 12/09/2017 1243   CHOLHDL 3.8 09/30/2015 1024   VLDL 11 09/30/2015 1024   LDLCALC 139 (H) 12/09/2017 1243    Physical Exam:    VS:  BP (!) 160/100   Ht 5\' 7"  (1.702 m)   Wt 153 lb 12.8 oz (69.8 kg)   BMI 24.09 kg/m     Wt Readings from Last 3 Encounters:  08/16/18 154 lb 12.8 oz (70.2 kg)  08/15/18 153 lb 12.8 oz (69.8 kg)  05/04/18 153 lb 3.2 oz (69.5 kg)     GEN:  Well nourished, well developed in no acute distress HEENT: Normal NECK: No JVD; No carotid bruits LYMPHATICS: No lymphadenopathy CARDIAC: RRR, no murmurs, rubs, gallops RESPIRATORY:  Clear to auscultation without rales, wheezing or rhonchi  ABDOMEN: Soft, non-tender, non-distended MUSCULOSKELETAL:  No edema; No deformity  SKIN: Warm and dry NEUROLOGIC:  Alert and oriented x 3 PSYCHIATRIC:  Normal affect   ASSESSMENT:    1. Uncontrolled hypertension    PLAN:    In order of problems listed above:  Markedly elevated blood pressure - Per history, it appears that secondary causes have been evaluated in the past. - Her husband states that at home her blood pressure has improved a little bit on the amlodipine addition. -I will go ahead and start spironolactone 25 mg once a day.  Check basic metabolic  profile in 1 week.,  Prior potassium 4.1. - I will also have her seen in the hypertension clinic for continued close monitoring and follow-up.  Chest pain - She does have some atypical features.  Currently stable.  I think ultimately, we need to get her blood pressure under control.  If chest pain continues, I would consider CT of coronary arteries. -EKG today looks good.   Medication Adjustments/Labs and Tests Ordered: Current medicines are reviewed at length with the patient today.  Concerns regarding medicines are outlined above.  Orders Placed This Encounter  Procedures  . Basic metabolic panel  . EKG 12-Lead  Meds ordered this encounter  Medications  . spironolactone (ALDACTONE) 25 MG tablet    Sig: Take 1 tablet (25 mg total) by mouth daily.    Dispense:  90 tablet    Refill:  3  . DISCONTD: spironolactone (ALDACTONE) 25 MG tablet    Sig: Take 1 tablet (25 mg total) by mouth daily.    Dispense:  30 tablet    Refill:  3    Patient Instructions  Medication Instructions:  Please start Spironolactone 25 once daily. Continue all other medications as listed.  If you need a refill on your cardiac medications before your next appointment, please call your pharmacy.   Lab work: Please have blood work 1 week after starting Spironolactone.  If you have labs (blood work) drawn today and your tests are completely normal, you will receive your results only by: Marland Kitchen MyChart Message (if you have MyChart) OR . A paper copy in the mail If you have any lab test that is abnormal or we need to change your treatment, we will call you to review the results.  Follow-Up: You are being referred to the Hypertension Clinic and should be seen in 2 to 3 weeks.  At [facility], you and your health needs are our priority.  As part of our continuing mission to provide you with exceptional heart care, we have created designated Provider Care Teams.  These Care Teams include your primary  Cardiologist (physician) and Advanced Practice Providers (APPs -  Physician Assistants and Nurse Practitioners) who all work together to provide you with the care you need, when you need it. You will need a follow up appointment in 12 months.  Please call our office 2 months in advance to schedule this appointment.  You may see Candee Furbish, MD or one of the following Advanced Practice Providers on your designated Care Team:   Truitt Merle, NP Cecilie Kicks, NP . Kathyrn Drown, NP  Thank you for choosing [facility]!!        Signed, Candee Furbish, MD  08/16/2018 6:12 PM    [place]

## 2018-08-16 ENCOUNTER — Ambulatory Visit: Payer: BLUE CROSS/BLUE SHIELD | Admitting: Family Medicine

## 2018-08-16 ENCOUNTER — Ambulatory Visit (INDEPENDENT_AMBULATORY_CARE_PROVIDER_SITE_OTHER): Payer: BLUE CROSS/BLUE SHIELD | Admitting: Family Medicine

## 2018-08-16 ENCOUNTER — Ambulatory Visit (INDEPENDENT_AMBULATORY_CARE_PROVIDER_SITE_OTHER): Payer: BLUE CROSS/BLUE SHIELD

## 2018-08-16 ENCOUNTER — Encounter: Payer: Self-pay | Admitting: Family Medicine

## 2018-08-16 VITALS — BP 107/72 | HR 76 | Resp 17 | Ht 67.0 in | Wt 154.8 lb

## 2018-08-16 DIAGNOSIS — G8929 Other chronic pain: Secondary | ICD-10-CM

## 2018-08-16 DIAGNOSIS — I10 Essential (primary) hypertension: Secondary | ICD-10-CM

## 2018-08-16 DIAGNOSIS — M546 Pain in thoracic spine: Secondary | ICD-10-CM

## 2018-08-16 DIAGNOSIS — Z3202 Encounter for pregnancy test, result negative: Secondary | ICD-10-CM | POA: Diagnosis not present

## 2018-08-16 DIAGNOSIS — M5124 Other intervertebral disc displacement, thoracic region: Secondary | ICD-10-CM

## 2018-08-16 LAB — POCT URINE PREGNANCY: Preg Test, Ur: NEGATIVE

## 2018-08-16 MED ORDER — MELOXICAM 15 MG PO TABS: 15.0000 mg | ORAL_TABLET | Freq: Every day | ORAL | 1 refills | Status: DC

## 2018-08-16 NOTE — Progress Notes (Signed)
Patient ID: Megan Baxter, female    DOB: 10-14-1970, 48 y.o.   MRN: [id number]  PCP: Scot Jun, FNP  Chief Complaint  Patient presents with  . Chest Pain    saw Cardiology yday & had normal EKG. starts from L shoulder blade & radiates to front. comes & goes randomly. last about 5-10 mins    Subjective:  HPI  Megan Baxter is a 48 y.o. female presents for evaluation of back and shoulder pain.   Megan Baxter has Absolute anemia; Uncontrolled hypertension; Dyslipidemia; and Primary insomnia on their problem list.  Patient initially scheduled appointment for evaluation of what was thought to be chest pain radiating from her left shoulder. She has since seen cardiology, Dr. Ocie Bob, and had a repeat EKG which was unremarkable. Cardiology started her on Spirolactone to improve blood pressure control. Her blood pressure was elevated during cardiology visit yesterday at 160/100. She is normotensive today.  She complains of intermittent, although worsening pain occurring left lower clavicle which radiates upward , under left axilla, into the chest wall. She complains of associated mid back pain. 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  She is taking ibuprofen which helps minimize the pain.  Denies any known recent injury.However in review of her EMR she had an abnormal MRI of thoracic spine in 2015 which was significant for mild degenerative disc disease, small left disc protrusion T9-T10. No formal follow-up with orthopedics or spine specialist. Denies numbness or tingling of left lower arm or fingers or left arm weakness. Social History   Socioeconomic History  . Marital status: Married    Spouse name: Not on file  . Number of children: Not on file  . Years of education: Not on file  . Highest education level: Not on file  Occupational History  . Not on file  Social Needs  . Financial resource strain: Not on  file  . Food insecurity:    Worry: Not on file    Inability: Not on file  . Transportation needs:    Medical: Not on file    Non-medical: Not on file  Tobacco Use  . Smoking status: Never Smoker  . Smokeless tobacco: Never Used  Substance and Sexual Activity  . Alcohol use: No  . Drug use: No  . Sexual activity: Yes  Lifestyle  . Physical activity:    Days per week: Not on file    Minutes per session: Not on file  . Stress: Not on file  Relationships  . Social connections:    Talks on phone: Not on file    Gets together: Not on file    Attends religious service: Not on file    Active member of club or organization: Not on file    Attends meetings of clubs or organizations: Not on file    Relationship status: Not on file  . Intimate partner violence:    Fear of current or ex partner: Not on file    Emotionally abused: Not on file    Physically abused: Not on file    Forced sexual activity: Not on file  Other Topics Concern  . Not on file  Social History Narrative  . Not on file    Family History  Problem Relation Age of Onset  . Hypertension Father   . Alcohol abuse Neg Hx   . Arthritis Neg Hx   . Asthma Neg Hx   . Birth  defects Neg Hx   . Cancer Neg Hx   . COPD Neg Hx   . Depression Neg Hx   . Diabetes Neg Hx   . Drug abuse Neg Hx   . Early death Neg Hx   . Hearing loss Neg Hx   . Heart disease Neg Hx   . Hyperlipidemia Neg Hx   . Kidney disease Neg Hx   . Learning disabilities Neg Hx   . Mental illness Neg Hx   . Mental retardation Neg Hx   . Miscarriages / Stillbirths Neg Hx   . Stroke Neg Hx   . Vision loss Neg Hx    Review of Systems Pertinent negatives listed in HPI Patient Active Problem List   Diagnosis Date Noted  . Primary insomnia 12/23/2016  . Dyslipidemia 05/14/2016  . Uncontrolled hypertension 11/21/2015  . Absolute anemia 05/23/2015    No Known Allergies  Prior to Admission medications   Medication Sig Start Date End Date  Taking? Authorizing Provider  amLODipine (NORVASC) 5 MG tablet Take 2 tablets (10 mg total) by mouth daily. 05/05/18  Yes Scot Jun, FNP  atorvastatin (LIPITOR) 20 MG tablet TAKE 1 TABLET BY MOUTH  DAILY 06/16/18  Yes Scot Jun, FNP  ferrous sulfate 325 (65 FE) MG tablet Take 1 tablet (325 mg total) by mouth 2 (two) times daily with a meal. 06/30/17  Yes Jegede, Olugbemiga E, MD  labetalol (NORMODYNE) 300 MG tablet Take 2 tablets (600 mg total) by mouth 2 (two) times daily. 02/03/18  Yes Charlott Rakes, MD  lisinopril-hydrochlorothiazide (PRINZIDE,ZESTORETIC) 20-12.5 MG tablet TAKE 2 TABLETS BY MOUTH  DAILY 06/16/18  Yes Scot Jun, FNP  spironolactone (ALDACTONE) 25 MG tablet Take 1 tablet (25 mg total) by mouth daily. 08/15/18  Yes Jerline Pain, MD    Past Medical, Surgical Family and Social History reviewed and updated.    Objective:   Today's Vitals   08/16/18 1033  BP: 107/72  Pulse: 76  Resp: 17  SpO2: 98%  Weight: 154 lb 12.8 oz (70.2 kg)  Height: 5\' 7"  (1.702 m)    Wt Readings from Last 3 Encounters:  08/16/18 154 lb 12.8 oz (70.2 kg)  08/15/18 153 lb 12.8 oz (69.8 kg)  05/04/18 153 lb 3.2 oz (69.5 kg)     Physical Exam General appearance: alert, well developed, well nourished, cooperative and in no distress Head: Normocephalic, without obvious abnormality, atraumatic Respiratory: Respirations even and unlabored, normal respiratory rate Heart: rate and rhythm normal. No gallop or murmurs noted on exam  Extremities: Left Clavicle: No gross deformities, mass, or palpable tenderness Skin: Skin color, texture, turgor normal. No rashes seen  Psych: Appropriate mood and affect. Neurologic: Mental status: Alert, oriented to person, place, and time, thought content appropriate. No results found for: POCGLU  Lab Results  Component Value Date   HGBA1C 5.4 12/23/2016    Assessment & Plan:  1. Uncontrolled hypertension -Controlled today. Recently  seen by cardiology and started in Spirolactone.  She has a follow-up scheduled in the hypertension clinic  2. Chronic bilateral thoracic back pain 3. 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 FINDINGS: There is anatomic alignment. No vertebral compression deformity. Mild anterior osteophyte formation in the thoracic  spine. IMPRESSION: No acute bony pathology. Electronically Signed   By: Marybelle Killings M.D.   On: 08/16/2018 12:31    Suspect chronic thoracic spine disease is the cause of clavicle pain.  Will trial Meloxicam 15 mg daily as needed Placed a referral to orthopedic surgery for second opinion.   Orders Placed This Encounter  Procedures  . DG Thoracic Spine W/Swimmers    Standing Status:   Future    Number of Occurrences:   1    Standing Expiration Date:   10/15/2019    Order Specific Question:   Reason for Exam (SYMPTOM  OR DIAGNOSIS REQUIRED)    Answer:   hx of buldging disc bilaterally Right T8-9 and Left T9-T10    Order Specific Question:   Is patient pregnant?    Answer:   Yes    Order Specific Question:   Preferred imaging location?    Answer:   External    Order Specific Question:   Call Results- Best Contact Number?    Answer:   [phone]    Order Specific Question:   Radiology Contrast Protocol - do NOT remove file path    Answer:   [email or web address]  . Ambulatory referral to Orthopedic Surgery    Referral Priority:   Routine    Referral Type:   Surgical    Referral Reason:   Specialty Services Required    Requested Specialty:   Orthopedic Surgery    Number of Visits Requested:   3  . POCT urine pregnancy    Meds ordered this encounter  Medications   . meloxicam (MOBIC) 15 MG tablet    Sig: Take 1 tablet (15 mg total) by mouth daily.    Dispense:  30 tablet    Refill:  1      -The patient was given clear instructions to go to ER or return to medical center if symptoms do not improve, worsen or new problems develop. The patient verbalized understanding.    Molli Barrows, FNP Primary Care at [facility] [address] [place] [phone]: [phone]

## 2018-08-16 NOTE — Patient Instructions (Addendum)
Your blood pressure looks great today!  Continue to follow-up with cardiology and the hypertension clinic. Schedule a follow-up appointment to have fasting labs completed. I have referred you to orthopedic surgery for further evaluation of ongoing back pain and history of an abnormal MRI indicating thoracic disc protrusion.  I have prescribed Meloxicam 15 mg daily as needed for back pain. Continue muscle rub application for pain management also. We will contact you via phone regarding imaging results.     Thoracic Strain  Thoracic strain is an injury to the muscles or tendons that attach to the upper back. A strain can be mild or severe. A mild strain may take only 1-2 weeks to heal. A severe strain involves torn muscles or tendons, so it may take 6-8 weeks to heal. Follow these instructions at home:  Rest as needed. Limit your activity as told by your doctor.  If directed, put ice on the injured area: ? Put ice in a plastic bag. ? Place a towel between your skin and the bag. ? Leave the ice on for 20 minutes, 2-3 times per day.  Take over-the-counter and prescription medicines only as told by your doctor.  Begin doing exercises as told by your doctor or physical therapist.  Warm up before being active.  Bend your knees before you lift heavy objects.  Keep all follow-up visits as told by your doctor. This is important. Contact a doctor if:  Your pain is not helped by medicine.  Your pain, bruising, or swelling is getting worse.  You have a fever. Get help right away if:  You have shortness of breath.  You have chest pain.  You have weakness or loss of feeling (numbness) in your legs.  You cannot control when you pee (urinate). This information is not intended to replace advice given to you by your health care provider. Make sure you discuss any questions you have with your health care provider. Document Released: 12/30/2007 Document Revised: 03/14/2016 Document  Reviewed: 09/06/2014 Elsevier Interactive Patient Education  2019 [employer].

## 2018-08-17 ENCOUNTER — Telehealth: Payer: Self-pay | Admitting: Family Medicine

## 2018-08-17 NOTE — Telephone Encounter (Signed)
Left voice mail to call back 

## 2018-08-17 NOTE — Telephone Encounter (Signed)
Contact patient to advise that recent thoracic x-ray was only significant for thoracic bony overgrowth was did not reveal any acute displacement of disc.  However I do suspect that the source of the pain is related to the MRI findings several years ago which indicated disc protrusion.  You have been referred to orthopedic for further evaluation.  They will review the recent imaging and prior MRI to determine whether further or current MRI imaging is warranted.  Continue treatment as prescribed.

## 2018-08-19 NOTE — Telephone Encounter (Signed)
Left voice mail to call back 

## 2018-08-22 ENCOUNTER — Other Ambulatory Visit: Payer: BLUE CROSS/BLUE SHIELD | Admitting: *Deleted

## 2018-08-22 DIAGNOSIS — I10 Essential (primary) hypertension: Secondary | ICD-10-CM

## 2018-08-22 LAB — BASIC METABOLIC PANEL
BUN/Creatinine Ratio: 14 (ref 9–23)
BUN: 14 mg/dL (ref 6–24)
CO2: 23 mmol/L (ref 20–29)
Calcium: 10.3 mg/dL — ABNORMAL HIGH (ref 8.7–10.2)
Chloride: 99 mmol/L (ref 96–106)
Creatinine, Ser: 1.01 mg/dL — ABNORMAL HIGH (ref 0.57–1.00)
GFR calc Af Amer: 77 mL/min/{1.73_m2} (ref >59)
GFR calc non Af Amer: 67 mL/min/{1.73_m2} (ref >59)
Glucose: 89 mg/dL (ref 65–99)
Potassium: 3.8 mmol/L (ref 3.5–5.2)
SODIUM: 137 mmol/L (ref 134–144)

## 2018-08-23 ENCOUNTER — Other Ambulatory Visit: Payer: BLUE CROSS/BLUE SHIELD

## 2018-08-23 DIAGNOSIS — Z13220 Encounter for screening for lipoid disorders: Secondary | ICD-10-CM

## 2018-08-23 DIAGNOSIS — Z1329 Encounter for screening for other suspected endocrine disorder: Secondary | ICD-10-CM

## 2018-08-23 DIAGNOSIS — E559 Vitamin D deficiency, unspecified: Secondary | ICD-10-CM

## 2018-08-23 DIAGNOSIS — Z131 Encounter for screening for diabetes mellitus: Secondary | ICD-10-CM

## 2018-08-23 DIAGNOSIS — Z130 Encounter for screening for diseases of the blood and blood-forming organs and certain disorders involving the immune mechanism: Secondary | ICD-10-CM

## 2018-08-24 ENCOUNTER — Other Ambulatory Visit: Payer: Self-pay

## 2018-08-24 ENCOUNTER — Ambulatory Visit (INDEPENDENT_AMBULATORY_CARE_PROVIDER_SITE_OTHER): Payer: BLUE CROSS/BLUE SHIELD | Admitting: Family Medicine

## 2018-08-24 ENCOUNTER — Encounter (INDEPENDENT_AMBULATORY_CARE_PROVIDER_SITE_OTHER): Payer: Self-pay | Admitting: Family Medicine

## 2018-08-24 DIAGNOSIS — Z130 Encounter for screening for diseases of the blood and blood-forming organs and certain disorders involving the immune mechanism: Secondary | ICD-10-CM

## 2018-08-24 DIAGNOSIS — M546 Pain in thoracic spine: Secondary | ICD-10-CM

## 2018-08-24 DIAGNOSIS — Z13220 Encounter for screening for lipoid disorders: Secondary | ICD-10-CM

## 2018-08-24 DIAGNOSIS — Z131 Encounter for screening for diabetes mellitus: Secondary | ICD-10-CM

## 2018-08-24 DIAGNOSIS — E559 Vitamin D deficiency, unspecified: Secondary | ICD-10-CM

## 2018-08-24 DIAGNOSIS — Z1329 Encounter for screening for other suspected endocrine disorder: Secondary | ICD-10-CM

## 2018-08-24 MED ORDER — METHOCARBAMOL 500 MG PO TABS: 500.0000 mg | ORAL_TABLET | Freq: Four times a day (QID) | ORAL | 1 refills | Status: DC | PRN

## 2018-08-24 MED ORDER — VITAMIN D-3 125 MCG (5000 UT) PO TABS: 1.0000 | ORAL_TABLET | Freq: Every day | ORAL | 3 refills | Status: DC

## 2018-08-24 MED ORDER — ETODOLAC 400 MG PO TABS: 400.0000 mg | ORAL_TABLET | Freq: Two times a day (BID) | ORAL | 3 refills | Status: DC | PRN

## 2018-08-24 NOTE — Progress Notes (Signed)
Office Visit Note   Patient: Megan Baxter           Date of Birth: Dec 12, 1970           MRN: [id number] Visit Date: 08/24/2018 Requested by: Scot Jun, [address], [place] [address] PCP: Scot Jun, FNP  Subjective: Chief Complaint  Patient presents with  . Middle Back - Pain    Pain around left scapula, intermittent x 5 years. NKI. Was given Meloxicam from PCP - does help.    HPI: She is a 48 year old with left-sided thoracic pain.  Her husband translates for her today.  He states that her pain started 3 or 4 years ago with no injury.  Intermittent pain in the thoracic area radiating to the chest.  She went to a cardiologist for cardiac work-up which was negative.  She has had an MRI in 2016 showing a left-sided protrusion at T9-10 with no nerve compression, and a right-sided protrusion at T8-9.  I reviewed that scan today.  She had plain x-rays done on the 21st which were unremarkable.  She has tried ibuprofen occasionally with relief of symptoms.  Typically she has a couple days without pain, and then she has a flareup.  She has not been to a physical therapist or a chiropractor for this.  She also has a history of vitamin D deficiency.  Her levels were checked yesterday but the results are still pending.  She has also been on Lipitor for hyperlipidemia.                ROS: She has hypertension which has been hard to control.  Other systems were reviewed and are negative.  Objective: Vital Signs: There were no vitals taken for this visit.  Physical Exam:  Thoracic spine: No significant scoliosis.  There is some tenderness near the midline at around the T8-10 area.  She has a trigger point in the left rhomboid area that seems to reproduce her pain.  Neck range of motion is full with negative Spurling's test.  Upper extremity strength and reflexes are normal.  Imaging: None today.  Assessment & Plan: 1.  Chronic intermittent left  thoracic pain, probably myofascial.  Vitamin D deficiency could be contributing.  Cannot rule out that her statin might be contributing. -Trial of deep tissue massage at home using a tennis ball.  Physical therapy for myofascial release techniques if still not improving.  Medications to take as needed.  We will treat vitamin D deficiency with a target level of 50-80. -If symptoms still do not improve, we will consider a trial off Lipitor for a few weeks to see if this helps. -Other future considerations would be chiropractic, or possibly trigger point injection.   Follow-Up Instructions: No follow-ups on file.      Procedures: No procedures performed  No notes on file    PMFS History: Patient Active Problem List   Diagnosis Date Noted  . Primary insomnia 12/23/2016  . Dyslipidemia 05/14/2016  . Uncontrolled hypertension 11/21/2015  . Absolute anemia 05/23/2015   Past Medical History:  Diagnosis Date  . Heartburn in pregnancy   . Hypertension   . Maternal anemia in pregnancy, antepartum 02/18/2015    Family History  Problem Relation Age of Onset  . Hypertension Father   . Alcohol abuse Neg Hx   . Arthritis Neg Hx   . Asthma Neg Hx   . Birth defects Neg Hx   . Cancer Neg Hx   .  COPD Neg Hx   . Depression Neg Hx   . Diabetes Neg Hx   . Drug abuse Neg Hx   . Early death Neg Hx   . Hearing loss Neg Hx   . Heart disease Neg Hx   . Hyperlipidemia Neg Hx   . Kidney disease Neg Hx   . Learning disabilities Neg Hx   . Mental illness Neg Hx   . Mental retardation Neg Hx   . Miscarriages / Stillbirths Neg Hx   . Stroke Neg Hx   . Vision loss Neg Hx     Past Surgical History:  Procedure Laterality Date  . CESAREAN SECTION N/A 04/26/2013   Procedure: CESAREAN SECTION;  Surgeon: Alwyn Pea, MD;  Location: [facility];  Service: Obstetrics;  Laterality: N/A;  . CESAREAN SECTION N/A 05/24/2015   Procedure: CESAREAN SECTION;  Surgeon: Everett Graff, MD;  Location: [facility];   Service: Obstetrics;  Laterality: N/A;  . NO PAST SURGERIES     Social History   Occupational History  . Not on file  Tobacco Use  . Smoking status: Never Smoker  . Smokeless tobacco: Never Used  Substance and Sexual Activity  . Alcohol use: No  . Drug use: No  . Sexual activity: Yes

## 2018-08-25 LAB — CBC WITH DIFFERENTIAL/PLATELET
Basophils Absolute: 0 10*3/uL (ref 0.0–0.2)
Basos: 1 %
EOS (ABSOLUTE): 0.1 10*3/uL (ref 0.0–0.4)
Eos: 2 %
Hematocrit: 36.8 % (ref 34.0–46.6)
Hemoglobin: 11.3 g/dL (ref 11.1–15.9)
Immature Grans (Abs): 0 10*3/uL (ref 0.0–0.1)
Immature Granulocytes: 0 %
LYMPHS: 37 %
Lymphocytes Absolute: 1.7 10*3/uL (ref 0.7–3.1)
MCH: 25.9 pg — ABNORMAL LOW (ref 26.6–33.0)
MCHC: 30.7 g/dL — ABNORMAL LOW (ref 31.5–35.7)
MCV: 84 fL (ref 79–97)
Monocytes Absolute: 0.2 10*3/uL (ref 0.1–0.9)
Monocytes: 5 %
Neutrophils Absolute: 2.5 10*3/uL (ref 1.4–7.0)
Neutrophils: 55 %
Platelets: 436 10*3/uL (ref 150–450)
RBC: 4.37 x10E6/uL (ref 3.77–5.28)
RDW: 12.8 % (ref 11.7–15.4)
WBC: 4.6 10*3/uL (ref 3.4–10.8)

## 2018-08-25 LAB — LIPID PANEL
CHOL/HDL RATIO: 2.7 ratio (ref 0.0–4.4)
Cholesterol, Total: 160 mg/dL (ref 100–199)
HDL: 59 mg/dL (ref >39)
LDL Calculated: 90 mg/dL (ref 0–99)
Triglycerides: 53 mg/dL (ref 0–149)
VLDL Cholesterol Cal: 11 mg/dL (ref 5–40)

## 2018-08-25 LAB — HEMOGLOBIN A1C
Est. average glucose Bld gHb Est-mCnc: 105 mg/dL
Hgb A1c MFr Bld: 5.3 % (ref 4.8–5.6)

## 2018-08-25 LAB — TSH: TSH: 0.671 u[IU]/mL (ref 0.450–4.500)

## 2018-08-25 LAB — VITAMIN D 25 HYDROXY (VIT D DEFICIENCY, FRACTURES): Vit D, 25-Hydroxy: 22.3 ng/mL — ABNORMAL LOW (ref 30.0–100.0)

## 2018-08-30 ENCOUNTER — Encounter: Payer: Self-pay | Admitting: *Deleted

## 2018-08-30 NOTE — Telephone Encounter (Signed)
Patient aware of xray results. Saw Ortho on 08/24/2018.

## 2018-09-06 ENCOUNTER — Ambulatory Visit: Payer: BLUE CROSS/BLUE SHIELD

## 2018-09-08 ENCOUNTER — Ambulatory Visit: Payer: BLUE CROSS/BLUE SHIELD

## 2018-09-08 NOTE — Progress Notes (Deleted)
Patient ID: Megan Baxter                 DOB: 06-08-1971                      MRN: [id number]     HPI: Megan Baxter is a 48 y.o. female referred by Dr. Marlou Porch to HTN clinic. PMH is significant for HTN and chest pain. Secondary causes of markedly elevated blood pressure have been done without any revealing causes. At last visit with Dr. Marlou Porch, patient was started on spironolactone 25mg  daily. Follow up BMP was stable.  Current HTN meds: amlodipine 10mg , labetalol 600mg  twice a day, lisinopril/HCTZ 40/25mg  daily, spironolactone 25mg  daily Previously tried: nifedipine  BP goal: <130/80  Family History: The patient's family history includes Hypertension in her father. There is no history of Alcohol abuse, Arthritis, Asthma, Birth defects, Cancer, COPD, Depression, Diabetes, Drug abuse, Early death, Hearing loss, Heart disease, Hyperlipidemia, Kidney disease, Learning disabilities, Mental illness, Mental retardation, Miscarriages / Stillbirths, Stroke, or Vision loss.  Social History: Neg tobacco and ETOH  Diet:   Exercise:   Home BP readings:   Wt Readings from Last 3 Encounters:  08/16/18 154 lb 12.8 oz (70.2 kg)  08/15/18 153 lb 12.8 oz (69.8 kg)  05/04/18 153 lb 3.2 oz (69.5 kg)   BP Readings from Last 3 Encounters:  08/16/18 107/72  08/15/18 (!) 160/100  05/10/18 129/83   Pulse Readings from Last 3 Encounters:  08/16/18 76  05/10/18 66  05/04/18 69    Renal function: CrCl cannot be calculated (Unknown ideal weight.).  Past Medical History:  Diagnosis Date  . Heartburn in pregnancy   . Hypertension   . Maternal anemia in pregnancy, antepartum 02/18/2015    Current Outpatient Medications on File Prior to Visit  Medication Sig Dispense Refill  . amLODipine (NORVASC) 5 MG tablet Take 2 tablets (10 mg total) by mouth daily. 180 tablet 3  . atorvastatin (LIPITOR) 20 MG tablet TAKE 1 TABLET BY MOUTH  DAILY 90 tablet 1  . Cholecalciferol (VITAMIN D-3) 125 MCG (5000 UT)  TABS Take 1 tablet by mouth daily. 90 tablet 3  . etodolac (LODINE) 400 MG tablet Take 1 tablet (400 mg total) by mouth 2 (two) times daily as needed. 60 tablet 3  . ferrous sulfate 325 (65 FE) MG tablet Take 1 tablet (325 mg total) by mouth 2 (two) times daily with a meal. 180 tablet 3  . labetalol (NORMODYNE) 300 MG tablet Take 2 tablets (600 mg total) by mouth 2 (two) times daily. 120 tablet 3  . lisinopril-hydrochlorothiazide (PRINZIDE,ZESTORETIC) 20-12.5 MG tablet TAKE 2 TABLETS BY MOUTH  DAILY 30 tablet 1  . meloxicam (MOBIC) 15 MG tablet Take 1 tablet (15 mg total) by mouth daily. 30 tablet 1  . methocarbamol (ROBAXIN) 500 MG tablet Take 1 tablet (500 mg total) by mouth every 6 (six) hours as needed for muscle spasms. 60 tablet 1  . spironolactone (ALDACTONE) 25 MG tablet Take 1 tablet (25 mg total) by mouth daily. 90 tablet 3   No current facility-administered medications on file prior to visit.     No Known Allergies  There were no vitals taken for this visit.   Assessment/Plan:  1. Hypertension -    Thank you  Ramond Dial, Pharm.D, [facility]  [address], [place], [place] [address]  Phone: [phone]; Fax: [phone]

## 2018-10-11 ENCOUNTER — Ambulatory Visit: Payer: BLUE CROSS/BLUE SHIELD | Admitting: Family Medicine

## 2018-10-26 ENCOUNTER — Ambulatory Visit: Payer: BLUE CROSS/BLUE SHIELD | Admitting: Family Medicine

## 2019-02-20 ENCOUNTER — Ambulatory Visit: Payer: BLUE CROSS/BLUE SHIELD | Admitting: Family Medicine

## 2019-04-27 ENCOUNTER — Other Ambulatory Visit: Payer: Self-pay | Admitting: Family Medicine

## 2019-04-27 DIAGNOSIS — I10 Essential (primary) hypertension: Secondary | ICD-10-CM

## 2019-05-01 ENCOUNTER — Telehealth: Payer: Self-pay | Admitting: Family Medicine

## 2019-05-01 DIAGNOSIS — I10 Essential (primary) hypertension: Secondary | ICD-10-CM

## 2019-05-01 MED ORDER — ATORVASTATIN CALCIUM 20 MG PO TABS: 20.0000 mg | ORAL_TABLET | Freq: Every day | ORAL | 0 refills | Status: DC

## 2019-05-01 MED ORDER — AMLODIPINE BESYLATE 5 MG PO TABS: 10.0000 mg | ORAL_TABLET | Freq: Every day | ORAL | 0 refills | Status: DC

## 2019-05-01 MED ORDER — LABETALOL HCL 300 MG PO TABS: 600.0000 mg | ORAL_TABLET | Freq: Two times a day (BID) | ORAL | 0 refills | Status: DC

## 2019-05-01 MED ORDER — LISINOPRIL-HYDROCHLOROTHIAZIDE 20-12.5 MG PO TABS: 2.0000 | ORAL_TABLET | Freq: Every day | ORAL | 0 refills | Status: DC

## 2019-05-01 NOTE — Telephone Encounter (Signed)
Rxs sent to last until appointment. No additional refills.

## 2019-05-01 NOTE — Telephone Encounter (Signed)
1) Medication(s) Requested (by name): labetalol ([place]) 300 MG tablet [id number]   2) Pharmacy of Choice:  [facility], [place] - [address] AT [address]   Approved medications will be sent to pharmacy, we will reach out to you if there is an issue.  Requests made after 3pm may not be addressed until following business day!

## 2019-05-07 ENCOUNTER — Other Ambulatory Visit: Payer: Self-pay

## 2019-05-07 ENCOUNTER — Ambulatory Visit (HOSPITAL_COMMUNITY)
Admission: EM | Admit: 2019-05-07 | Discharge: 2019-05-07 | Disposition: A | Discharge status: Home / Self Care | Payer: BC Managed Care – PPO | Attending: Family Medicine | Admitting: Family Medicine

## 2019-05-07 ENCOUNTER — Encounter (HOSPITAL_COMMUNITY): Payer: Self-pay | Admitting: Family Medicine

## 2019-05-07 DIAGNOSIS — T7840XA Allergy, unspecified, initial encounter: Secondary | ICD-10-CM | POA: Diagnosis not present

## 2019-05-07 DIAGNOSIS — L509 Urticaria, unspecified: Secondary | ICD-10-CM | POA: Diagnosis not present

## 2019-05-07 DIAGNOSIS — I10 Essential (primary) hypertension: Secondary | ICD-10-CM

## 2019-05-07 DIAGNOSIS — T783XXA Angioneurotic edema, initial encounter: Secondary | ICD-10-CM

## 2019-05-07 MED ORDER — METHYLPREDNISOLONE SODIUM SUCC 125 MG IJ SOLR
125.0000 mg | Freq: Once | INTRAMUSCULAR | Status: AC
  Administered 2019-05-07: 125 mg via INTRAMUSCULAR

## 2019-05-07 MED ORDER — DIPHENHYDRAMINE HCL 25 MG PO TABS: 25.0000 mg | ORAL_TABLET | Freq: Four times a day (QID) | ORAL | 0 refills | Status: DC | PRN

## 2019-05-07 MED ORDER — DIPHENHYDRAMINE HCL 25 MG PO CAPS
ORAL_CAPSULE | ORAL | Status: AC
  Filled 2019-05-07: qty 1

## 2019-05-07 MED ORDER — DIPHENHYDRAMINE HCL 25 MG PO CAPS
25.0000 mg | ORAL_CAPSULE | Freq: Once | ORAL | Status: AC
  Administered 2019-05-07: 25 mg via ORAL

## 2019-05-07 MED ORDER — METHYLPREDNISOLONE SODIUM SUCC 125 MG IJ SOLR
INTRAMUSCULAR | Status: AC
  Filled 2019-05-07: qty 2

## 2019-05-07 NOTE — ED Provider Notes (Signed)
[facility]    CSN: [id number] Arrival date & time: 05/07/19  1109      History   Chief Complaint Chief Complaint  Patient presents with  . Allergic Reaction    itchy, inflammed spots    HPI Megan Baxter is a 48 y.o. female.    Allergic Reaction Presenting symptoms: itching, rash and swelling   Presenting symptoms: no difficulty breathing, no difficulty swallowing and no wheezing   Presenting symptoms comment:  Lower lip swelling and widespread urticaria.  Severity:  Moderate Prior allergic episodes:  No prior episodes Context: not animal exposure, not chemicals, not cosmetics, not dairy/milk products, not eggs, not food allergies, not grass, not insect bite/sting, not jewelry/metal, not medications, not new detergents/soaps, not nuts and not poison ivy   Relieved by:  Nothing Worsened by:  Nothing Ineffective treatments:  None tried   Past Medical History:  Diagnosis Date  . Heartburn in pregnancy   . Hypertension   . Maternal anemia in pregnancy, antepartum 02/18/2015    Patient Active Problem List   Diagnosis Date Noted  . Primary insomnia 12/23/2016  . Dyslipidemia 05/14/2016  . Uncontrolled hypertension 11/21/2015  . Absolute anemia 05/23/2015    Past Surgical History:  Procedure Laterality Date  . CESAREAN SECTION N/A 04/26/2013   Procedure: CESAREAN SECTION;  Surgeon: Alwyn Pea, MD;  Location: [facility];  Service: Obstetrics;  Laterality: N/A;  . CESAREAN SECTION N/A 05/24/2015   Procedure: CESAREAN SECTION;  Surgeon: Everett Graff, MD;  Location: [facility];  Service: Obstetrics;  Laterality: N/A;  . NO PAST SURGERIES      OB History    Gravida  4   Para  4   Term  3   Preterm  1   AB  0   Living  3     SAB  0   TAB  0   Ectopic  0   Multiple  0   Live Births  3            Home Medications    Prior to Admission medications   Medication Sig Start Date End Date Taking? Authorizing Provider  amLODipine  (NORVASC) 5 MG tablet Take 2 tablets (10 mg total) by mouth daily. 05/01/19   Fulp, Cammie, MD  atorvastatin (LIPITOR) 20 MG tablet Take 1 tablet (20 mg total) by mouth daily. 05/01/19   Fulp, Cammie, MD  Cholecalciferol (VITAMIN D-3) 125 MCG (5000 UT) TABS Take 1 tablet by mouth daily. 08/24/18   Hilts, Legrand Como, MD  diphenhydrAMINE (BENADRYL) 25 MG tablet Take 1 tablet (25 mg total) by mouth every 6 (six) hours as needed. 05/07/19   Loura Halt A, NP  ferrous sulfate 325 (65 FE) MG tablet Take 1 tablet (325 mg total) by mouth 2 (two) times daily with a meal. 06/30/17   Jegede, Olugbemiga E, MD  labetalol (NORMODYNE) 300 MG tablet Take 2 tablets (600 mg total) by mouth 2 (two) times daily. 05/01/19   Fulp, Cammie, MD  lisinopril-hydrochlorothiazide (ZESTORETIC) 20-12.5 MG tablet Take 2 tablets by mouth daily. 05/01/19   Fulp, Cammie, MD  spironolactone (ALDACTONE) 25 MG tablet Take 1 tablet (25 mg total) by mouth daily. 08/15/18   Jerline Pain, MD    Family History Family History  Problem Relation Age of Onset  . Healthy Father   . Hypertension Mother   . Alcohol abuse Neg Hx   . Arthritis Neg Hx   . Asthma Neg Hx   . Birth  defects Neg Hx   . Cancer Neg Hx   . COPD Neg Hx   . Depression Neg Hx   . Diabetes Neg Hx   . Drug abuse Neg Hx   . Early death Neg Hx   . Hearing loss Neg Hx   . Heart disease Neg Hx   . Hyperlipidemia Neg Hx   . Kidney disease Neg Hx   . Learning disabilities Neg Hx   . Mental illness Neg Hx   . Mental retardation Neg Hx   . Miscarriages / Stillbirths Neg Hx   . Stroke Neg Hx   . Vision loss Neg Hx     Social History Social History   Tobacco Use  . Smoking status: Never Smoker  . Smokeless tobacco: Never Used  Substance Use Topics  . Alcohol use: No  . Drug use: No     Allergies   Patient has no known allergies.   Review of Systems Review of Systems  HENT: Negative for trouble swallowing.   Respiratory: Negative for wheezing.   Skin: Positive  for itching and rash.     Physical Exam Triage Vital Signs ED Triage Vitals  Enc Vitals Group     BP 05/07/19 1121 (!) 168/98     Pulse Rate 05/07/19 1121 90     Resp --      Temp 05/07/19 1121 99.4 F (37.4 C)     Temp Source 05/07/19 1121 Oral     SpO2 05/07/19 1121 100 %     Weight 05/07/19 1122 161 lb (73 kg)     Height --      Head Circumference --      Peak Flow --      Pain Score 05/07/19 1122 8     Pain Loc --      Pain Edu? --      Excl. in [place]? --    No data found.  Updated Vital Signs BP (!) 168/98 (BP Location: Left Arm)   Pulse 90   Temp 99.4 F (37.4 C) (Oral)   Wt 161 lb (73 kg)   LMP 04/30/2019 (Exact Date)   SpO2 100%   BMI 25.22 kg/m   Visual Acuity Right Eye Distance:   Left Eye Distance:   Bilateral Distance:    Right Eye Near:   Left Eye Near:    Bilateral Near:     Physical Exam Vitals signs and nursing note reviewed.  Constitutional:      General: She is not in acute distress.    Appearance: Normal appearance. She is not ill-appearing, toxic-appearing or diaphoretic.  HENT:     Head: Normocephalic and atraumatic.     Nose: Nose normal.     Mouth/Throat:     Pharynx: Oropharynx is clear.     Comments: Lower lip swelling  Eyes:     Conjunctiva/sclera: Conjunctivae normal.  Cardiovascular:     Rate and Rhythm: Normal rate and regular rhythm.  Pulmonary:     Effort: Pulmonary effort is normal.     Breath sounds: Normal breath sounds.  Musculoskeletal: Normal range of motion.  Skin:    General: Skin is warm and dry.     Findings: Rash present.     Comments: Urticarial rash to back, stomach and legs.  Neurological:     Mental Status: She is alert.  Psychiatric:        Mood and Affect: Mood normal.      UC Treatments / Results  Labs (all labs ordered are listed, but only abnormal results are displayed) Labs Reviewed - No data to display  EKG   Radiology No results found.  Procedures Procedures (including critical  care time)  Medications Ordered in UC Medications  methylPREDNISolone sodium succinate (SOLU-MEDROL) 125 mg/2 mL injection 125 mg (125 mg Intramuscular Given 05/07/19 1204)  diphenhydrAMINE (BENADRYL) capsule 25 mg (25 mg Oral Given 05/07/19 1205)  methylPREDNISolone sodium succinate (SOLU-MEDROL) 125 mg/2 mL injection (has no administration in time range)  diphenhydrAMINE (BENADRYL) 25 mg capsule (has no administration in time range)    Initial Impression / Assessment and Plan / UC Course  I have reviewed the triage vital signs and the nursing notes.  Pertinent labs & imaging results that were available during my care of the patient were reviewed by me and considered in my medical decision making (see chart for details).     Allergic reaction to unknown substance Treating with the prednisone injection here in clinic. Benadryl for itching We will have her continue the Benadryl every 6 hours Strict return precautions given Follow up as needed for continued or worsening symptoms  Final Clinical Impressions(s) / UC Diagnoses   Final diagnoses:  Allergic reaction, initial encounter  Urticaria  Angioedema, initial encounter     Discharge Instructions     Treating you for an allergic reaction.  We gave you a steroid injection here in clinic and will have you do Benadryl every 6 hours If your symptoms worsen to include more severe swelling or trouble swallowing or breathing you will need to go straight to the ER. Otherwise follow-up with your doctor as needed    ED Prescriptions    Medication Sig Dispense Auth. Provider   diphenhydrAMINE (BENADRYL) 25 MG tablet Take 1 tablet (25 mg total) by mouth every 6 (six) hours as needed. 30 tablet Loura Halt A, NP     PDMP not reviewed this encounter.   Orvan July, NP 05/07/19 1227

## 2019-05-07 NOTE — ED Triage Notes (Signed)
Pt. States she has a allergic reaction to one of her new meds which started last Saturday. She has hives all over her body including a swollen lip.

## 2019-05-07 NOTE — Discharge Instructions (Signed)
Treating you for an allergic reaction.  We gave you a steroid injection here in clinic and will have you do Benadryl every 6 hours If your symptoms worsen to include more severe swelling or trouble swallowing or breathing you will need to go straight to the ER. Otherwise follow-up with your doctor as needed

## 2019-05-14 ENCOUNTER — Other Ambulatory Visit: Payer: Self-pay

## 2019-05-14 ENCOUNTER — Emergency Department (HOSPITAL_COMMUNITY)
Admission: EM | Admit: 2019-05-14 | Discharge: 2019-05-14 | Disposition: A | Discharge status: Home / Self Care | Payer: BC Managed Care – PPO | Attending: Emergency Medicine | Admitting: Emergency Medicine

## 2019-05-14 DIAGNOSIS — T783XXA Angioneurotic edema, initial encounter: Secondary | ICD-10-CM | POA: Insufficient documentation

## 2019-05-14 DIAGNOSIS — I10 Essential (primary) hypertension: Secondary | ICD-10-CM | POA: Diagnosis not present

## 2019-05-14 DIAGNOSIS — T7840XA Allergy, unspecified, initial encounter: Secondary | ICD-10-CM

## 2019-05-14 DIAGNOSIS — Z79899 Other long term (current) drug therapy: Secondary | ICD-10-CM | POA: Diagnosis not present

## 2019-05-14 DIAGNOSIS — R21 Rash and other nonspecific skin eruption: Secondary | ICD-10-CM | POA: Diagnosis present

## 2019-05-14 LAB — CBC WITH DIFFERENTIAL/PLATELET
Abs Immature Granulocytes: 0.02 10*3/uL (ref 0.00–0.07)
Basophils Absolute: 0 10*3/uL (ref 0.0–0.1)
Basophils Relative: 0 %
Eosinophils Absolute: 0.2 10*3/uL (ref 0.0–0.5)
Eosinophils Relative: 2 %
HCT: 36.2 % (ref 36.0–46.0)
Hemoglobin: 11.6 g/dL — ABNORMAL LOW (ref 12.0–15.0)
Immature Granulocytes: 0 %
Lymphocytes Relative: 31 %
Lymphs Abs: 3.1 10*3/uL (ref 0.7–4.0)
MCH: 25 pg — ABNORMAL LOW (ref 26.0–34.0)
MCHC: 32 g/dL (ref 30.0–36.0)
MCV: 78 fL — ABNORMAL LOW (ref 80.0–100.0)
Monocytes Absolute: 0.7 10*3/uL (ref 0.1–1.0)
Monocytes Relative: 7 %
Neutro Abs: 5.8 10*3/uL (ref 1.7–7.7)
Neutrophils Relative %: 60 %
Platelets: 430 10*3/uL — ABNORMAL HIGH (ref 150–400)
RBC: 4.64 MIL/uL (ref 3.87–5.11)
RDW: 16.2 % — ABNORMAL HIGH (ref 11.5–15.5)
WBC: 9.7 10*3/uL (ref 4.0–10.5)
nRBC: 0 % (ref 0.0–0.2)

## 2019-05-14 LAB — BASIC METABOLIC PANEL
Anion gap: 13 (ref 5–15)
BUN: 14 mg/dL (ref 6–20)
CO2: 25 mmol/L (ref 22–32)
Calcium: 9.7 mg/dL (ref 8.9–10.3)
Chloride: 100 mmol/L (ref 98–111)
Creatinine, Ser: 0.87 mg/dL (ref 0.44–1.00)
GFR calc Af Amer: >60 mL/min (ref >60)
GFR calc non Af Amer: >60 mL/min (ref >60)
Glucose, Bld: 124 mg/dL — ABNORMAL HIGH (ref 70–99)
Potassium: 3.1 mmol/L — ABNORMAL LOW (ref 3.5–5.1)
Sodium: 138 mmol/L (ref 135–145)

## 2019-05-14 MED ORDER — DIPHENHYDRAMINE HCL 50 MG/ML IJ SOLN
INTRAMUSCULAR | Status: AC
  Administered 2019-05-14: 18:00:00 50 mg
  Filled 2019-05-14: qty 1

## 2019-05-14 MED ORDER — EPINEPHRINE 0.3 MG/0.3ML IJ SOAJ
INTRAMUSCULAR | Status: AC
  Administered 2019-05-14: 18:00:00 0.3 mg via INTRAMUSCULAR
  Filled 2019-05-14: qty 0.3

## 2019-05-14 MED ORDER — DEXAMETHASONE SODIUM PHOSPHATE 10 MG/ML IJ SOLN
10.0000 mg | Freq: Once | INTRAMUSCULAR | Status: AC
  Administered 2019-05-14: 21:00:00 10 mg via INTRAVENOUS
  Filled 2019-05-14: qty 1

## 2019-05-14 MED ORDER — METHYLPREDNISOLONE SODIUM SUCC 125 MG IJ SOLR
INTRAMUSCULAR | Status: AC
  Administered 2019-05-14: 125 mg
  Filled 2019-05-14: qty 2

## 2019-05-14 MED ORDER — FAMOTIDINE IN NACL 20-0.9 MG/50ML-% IV SOLN
20.0000 mg | Freq: Once | INTRAVENOUS | Status: AC
  Administered 2019-05-14: 20 mg via INTRAVENOUS
  Filled 2019-05-14: qty 50

## 2019-05-14 MED ORDER — DIPHENHYDRAMINE HCL 25 MG PO TABS: 25.0000 mg | ORAL_TABLET | Freq: Four times a day (QID) | ORAL | 0 refills | Status: DC | PRN

## 2019-05-14 MED ORDER — METHYLPREDNISOLONE 4 MG PO TBPK: ORAL_TABLET | ORAL | 0 refills | Status: DC

## 2019-05-14 NOTE — Discharge Instructions (Addendum)
Stop taking your ACE inhibitor. Take Benadryl 25mg  every 8 hours for the next two days. I have also prescribed a steroid dosepak.

## 2019-05-14 NOTE — ED Provider Notes (Addendum)
[facility] EMERGENC[facility] DEPARTMENT Provider Note   CSN: [id number] Arrival date & time: 05/14/19  1758     History   Chief Complaint Chief Complaint  Patient presents with  . Angioedema  . Rash    HPI Megan Baxter is a 48 y.o. female.     HPI  The patient is a 48 year old female who presents to the ED with an allergic reaction and concern for angioedema. She has HTN and takes Lisinopril-HCTZ. Today she presents with concern to one of her new medications which she started on Saturday. She is not sure which medication. She reports a rash on her back and swelling in her lips and tongue. No nausea, vomiting, abdominal pain. No fevers or chills.   Past Medical History:  Diagnosis Date  . Heartburn in pregnancy   . Hypertension   . Maternal anemia in pregnancy, antepartum 02/18/2015    Patient Active Problem List   Diagnosis Date Noted  . Primary insomnia 12/23/2016  . Dyslipidemia 05/14/2016  . Uncontrolled hypertension 11/21/2015  . Absolute anemia 05/23/2015    Past Surgical History:  Procedure Laterality Date  . CESAREAN SECTION N/A 04/26/2013   Procedure: CESAREAN SECTION;  Surgeon: Alwyn Pea, MD;  Location: [facility];  Service: Obstetrics;  Laterality: N/A;  . CESAREAN SECTION N/A 05/24/2015   Procedure: CESAREAN SECTION;  Surgeon: Everett Graff, MD;  Location: [facility];  Service: Obstetrics;  Laterality: N/A;  . NO PAST SURGERIES       OB History    Gravida  4   Para  4   Term  3   Preterm  1   AB  0   Living  3     SAB  0   TAB  0   Ectopic  0   Multiple  0   Live Births  3            Home Medications    Prior to Admission medications   Medication Sig Start Date End Date Taking? Authorizing Provider  amLODipine (NORVASC) 5 MG tablet Take 2 tablets (10 mg total) by mouth daily. 05/01/19   Fulp, Cammie, MD  atorvastatin (LIPITOR) 20 MG tablet Take 1 tablet (20 mg total) by mouth daily. 05/01/19   Fulp, Cammie, MD   Cholecalciferol (VITAMIN D-3) 125 MCG (5000 UT) TABS Take 1 tablet by mouth daily. 08/24/18   Hilts, Legrand Como, MD  diphenhydrAMINE (BENADRYL) 25 MG tablet Take 1 tablet (25 mg total) by mouth every 6 (six) hours as needed. 05/14/19   Regan Lemming, MD  ferrous sulfate 325 (65 FE) MG tablet Take 1 tablet (325 mg total) by mouth 2 (two) times daily with a meal. 06/30/17   Jegede, Olugbemiga E, MD  labetalol (NORMODYNE) 300 MG tablet Take 2 tablets (600 mg total) by mouth 2 (two) times daily. 05/01/19   Fulp, Cammie, MD  methylPREDNISolone (MEDROL DOSEPAK) 4 MG TBPK tablet Take as directed on the pack 05/14/19   Regan Lemming, MD  spironolactone (ALDACTONE) 25 MG tablet Take 1 tablet (25 mg total) by mouth daily. 08/15/18   Jerline Pain, MD  lisinopril-hydrochlorothiazide (ZESTORETIC) 20-12.5 MG tablet Take 2 tablets by mouth daily. 05/01/19 05/14/19  Antony Blackbird, MD    Family History Family History  Problem Relation Age of Onset  . Healthy Father   . Hypertension Mother   . Alcohol abuse Neg Hx   . Arthritis Neg Hx   . Asthma Neg Hx   .  Birth defects Neg Hx   . Cancer Neg Hx   . COPD Neg Hx   . Depression Neg Hx   . Diabetes Neg Hx   . Drug abuse Neg Hx   . Early death Neg Hx   . Hearing loss Neg Hx   . Heart disease Neg Hx   . Hyperlipidemia Neg Hx   . Kidney disease Neg Hx   . Learning disabilities Neg Hx   . Mental illness Neg Hx   . Mental retardation Neg Hx   . Miscarriages / Stillbirths Neg Hx   . Stroke Neg Hx   . Vision loss Neg Hx     Social History Social History   Tobacco Use  . Smoking status: Never Smoker  . Smokeless tobacco: Never Used  Substance Use Topics  . Alcohol use: No  . Drug use: No     Allergies   Patient has no known allergies.   Review of Systems Review of Systems  Constitutional: Negative for chills and fever.  HENT: Positive for trouble swallowing. Negative for drooling, sore throat and voice change.        Swollen lips and tongue   Eyes: Negative for pain.  Respiratory: Negative for cough and shortness of breath.   Cardiovascular: Negative for chest pain and palpitations.  Gastrointestinal: Negative for abdominal pain, diarrhea, nausea and vomiting.  Genitourinary: Negative for dysuria and hematuria.  Musculoskeletal: Negative for arthralgias and back pain.  Skin: Positive for rash. Negative for color change.  Neurological: Negative for seizures and syncope.  All other systems reviewed and are negative.    Physical Exam Updated Vital Signs BP (!) 152/90   Pulse 73   Temp 97.8 F (36.6 C) (Tympanic)   Resp 13   LMP 04/30/2019 (Exact Date)   SpO2 100%   Physical Exam Vitals signs and nursing note reviewed.  Constitutional:      General: She is not in acute distress.    Appearance: She is well-developed.  HENT:     Head: Normocephalic and atraumatic.     Mouth/Throat:     Comments: Significant swelling of the tongue, mild swelling of the lips. No drooling or stridor noted. Eyes:     Conjunctiva/sclera: Conjunctivae normal.  Neck:     Musculoskeletal: Neck supple.  Cardiovascular:     Rate and Rhythm: Normal rate and regular rhythm.  Pulmonary:     Effort: Pulmonary effort is normal. No respiratory distress.     Breath sounds: Normal breath sounds.  Abdominal:     Palpations: Abdomen is soft.     Tenderness: There is no abdominal tenderness.  Skin:    General: Skin is warm and dry.     Findings: Rash present.     Comments: Urticarial rash noted along the patient's back  Neurological:     Mental Status: She is alert.      ED Treatments / Results  Labs (all labs ordered are listed, but only abnormal results are displayed) Labs Reviewed  CBC WITH DIFFERENTIAL/PLATELET - Abnormal; Notable for the following components:      Result Value   Hemoglobin 11.6 (*)    MCV 78.0 (*)    MCH 25.0 (*)    RDW 16.2 (*)    Platelets 430 (*)    All other components within normal limits  BASIC METABOLIC  PANEL - Abnormal; Notable for the following components:   Potassium 3.1 (*)    Glucose, Bld 124 (*)    All  other components within normal limits    EKG EKG Interpretation  Date/Time:  Sunday May 14 2019 18:13:36 EDT Ventricular Rate:  84 PR Interval:    QRS Duration: 86 QT Interval:  392 QTC Calculation: 464 R Axis:   36 Text Interpretation:  Sinus rhythm Borderline T abnormalities, lateral leads No significant change since last tracing Confirmed by Yao, David H ([id number]) on 05/14/2019 8:08:04 PM   Radiology No results found.  Procedures Procedures (including critical care time)  Medications Ordered in ED Medications  diphenhydrAMINE (BENADRYL) 50 MG/ML injection (50 mg  Given 05/14/19 1810)  methylPREDNISolone sodium succinate (SOLU-MEDROL) 125 mg/2 mL injection (125 mg  Given 05/14/19 1810)  EPINEPHrine (EPI-PEN) 0.3 mg/0.3 mL injection (0.3 mg Intramuscular Given 05/14/19 1808)  famotidine (PEPCID) IVPB 20 mg premix (0 mg Intravenous Stopped 05/14/19 1844)  dexamethasone (DECADRON) injection 10 mg (10 mg Intravenous Given 05/14/19 2030)     Initial Impression / Assessment and Plan / ED Course  I have reviewed the triage vital signs and the nursing notes.  Pertinent labs & imaging results that were available during my care of the patient were reviewed by me and considered in my medical decision making (see chart for details).        The patient is a 48 year old female who presents with an allergic reaction and concern for angioedema. No stridor or drooling on arrival. Significant swelling to the tongue noted with a urticarial rash. Mild lip swelling. Concern for reaction to her ACE inhibitor.   The patient was provided an Epi pen, Solumedrol, and Benadryl in the ED. She was monitored for four hours with noted significant improvement in her symptoms. Fiberoptic laryngoscopy was performed bedside. The vocal cords, vallecula, and epiglottis were visualized without  significant edema.   On re-assessment, her swelling had improved. No stridor or drooling noted. The patient is able to swallow and phonate. Her rash had resolved. Advised that she stop her ACE, take Benadryl for the next two days, and follow up with her PCP. Provided a Medrol dose pack. Stable for discharge with return precautions provided.  Final Clinical Impressions(s) / ED Diagnoses   Final diagnoses:  Allergic reaction, initial encounter  Angioedema, initial encounter    ED Discharge Orders         Ordered    methylPREDNISolone (MEDROL DOSEPAK) 4 MG TBPK tablet     05/14/19 2155    diphenhydrAMINE (BENADRYL) 25 MG tablet  Every 6 hours PRN     10 /18/20 2156           Regan Lemming, MD 05/15/19 1254    Regan Lemming, MD 05/15/19 1256    Drenda Freeze, MD 05/16/19 [phone]

## 2019-05-14 NOTE — ED Notes (Addendum)
EDP performing nasopharyngeal endoscopy at bedside .

## 2019-05-14 NOTE — ED Triage Notes (Signed)
Patient arrives with angioedema and rash to back. MD at bed assessing patient. Oxygen saturation 100%.

## 2019-05-16 NOTE — ED Provider Notes (Addendum)
  Physical Exam  BP (!) 152/90   Pulse 73   Temp 97.8 F (36.6 C) (Tympanic)   Resp 13   LMP 04/30/2019 (Exact Date)   SpO2 100%   Physical Exam  ED Course/Procedures     Fiberoptic laryngoscopy  Date/Time: 05/16/2019 2:50 PM Performed by: Drenda Freeze, MD Authorized by: Drenda Freeze, MD  Consent: Verbal consent obtained. Risks and benefits: risks, benefits and alternatives were discussed Consent given by: patient Patient understanding: patient states understanding of the procedure being performed Patient consent: the patient's understanding of the procedure matches consent given Patient identity confirmed: verbally with patient Time out: Immediately prior to procedure a "time out" was called to verify the correct patient, procedure, equipment, support staff and site/side marked as required. Local anesthesia used: yes  Anesthesia: Local anesthesia used: yes Local Anesthetic: topical anesthetic  Sedation: Patient sedated: no  Patient tolerance: patient tolerated the procedure well with no immediate complications Comments: Laryngoscopy showed normal vocal cords, some swelling of the vallecula     MDM  I saw and evaluated the patient, reviewed the resident's note and I agree with the findings and plan.  EKG: EKG Interpretation  Date/Time:  Sunday May 14 2019 18:13:36 EDT Ventricular Rate:  84 PR Interval:    QRS Duration: 86 QT Interval:  392 QTC Calculation: 464 R Axis:   36 Text Interpretation:  Sinus rhythm Borderline T abnormalities, lateral leads No significant change since last tracing Confirmed by Wandra Arthurs [phone]) on 05/14/2019 8:08:04 PM  Megan Baxter is a 48 y.o. female hx of HTN on lisinopril here with angioedema. Had a rash earlier today and went to urgent care. Had acute onset of lip swelling. On exam, has obvious angioedema and neck swelling and tongue swelling. No stridor, no wheezing. Bedside laryngoscopy showed no obvious vocal  cord edema. Given steroids, benadryl, pepcid. Lip and tongue swelling improved. Observed for 4 hours. Will discontinue lisinopril and dc home with steroid taper   CRITICAL CARE Performed by: Wandra Arthurs   Total critical care time: 30 minutes  Critical care time was exclusive of separately billable procedures and treating other patients.  Critical care was necessary to treat or prevent imminent or life-threatening deterioration.  Critical care was time spent personally by me on the following activities: development of treatment plan with patient and/or surrogate as well as nursing, discussions with consultants, evaluation of patient's response to treatment, examination of patient, obtaining history from patient or surrogate, ordering and performing treatments and interventions, ordering and review of laboratory studies, ordering and review of radiographic studies, pulse oximetry and re-evaluation of patient's condition.      Drenda Freeze, MD 05/16/19 1453    Drenda Freeze, MD 05/16/19 [phone]

## 2019-05-19 ENCOUNTER — Telehealth: Payer: Self-pay

## 2019-05-19 NOTE — Telephone Encounter (Signed)
Called patient to do their pre-visit COVID screening([id number]).  Call went to voicemail. Unable to do prescreening.

## 2019-05-22 ENCOUNTER — Ambulatory Visit: Payer: BC Managed Care – PPO

## 2019-05-26 ENCOUNTER — Telehealth: Payer: Self-pay

## 2019-05-26 NOTE — Telephone Encounter (Signed)
Called patient to do their pre-visit COVID screening.  Call went to voicemail. Unable to do prescreening.  

## 2019-05-29 ENCOUNTER — Ambulatory Visit: Payer: BC Managed Care – PPO

## 2019-06-02 ENCOUNTER — Other Ambulatory Visit: Payer: Self-pay | Admitting: Family Medicine

## 2019-06-05 ENCOUNTER — Ambulatory Visit (INDEPENDENT_AMBULATORY_CARE_PROVIDER_SITE_OTHER): Payer: BC Managed Care – PPO | Admitting: Family Medicine

## 2019-06-05 ENCOUNTER — Ambulatory Visit: Payer: BC Managed Care – PPO

## 2019-06-05 ENCOUNTER — Other Ambulatory Visit: Payer: Self-pay

## 2019-06-05 VITALS — BP 167/91 | HR 73 | Temp 97.2°F | Resp 17 | Wt 159.8 lb

## 2019-06-05 DIAGNOSIS — T783XXD Angioneurotic edema, subsequent encounter: Secondary | ICD-10-CM | POA: Diagnosis not present

## 2019-06-05 DIAGNOSIS — Z79899 Other long term (current) drug therapy: Secondary | ICD-10-CM

## 2019-06-05 DIAGNOSIS — E559 Vitamin D deficiency, unspecified: Secondary | ICD-10-CM

## 2019-06-05 DIAGNOSIS — R7309 Other abnormal glucose: Secondary | ICD-10-CM

## 2019-06-05 DIAGNOSIS — Z603 Acculturation difficulty: Secondary | ICD-10-CM

## 2019-06-05 DIAGNOSIS — E785 Hyperlipidemia, unspecified: Secondary | ICD-10-CM | POA: Diagnosis not present

## 2019-06-05 DIAGNOSIS — I10 Essential (primary) hypertension: Secondary | ICD-10-CM

## 2019-06-05 DIAGNOSIS — Z789 Other specified health status: Secondary | ICD-10-CM

## 2019-06-05 DIAGNOSIS — Z09 Encounter for follow-up examination after completed treatment for conditions other than malignant neoplasm: Secondary | ICD-10-CM

## 2019-06-05 DIAGNOSIS — Z758 Other problems related to medical facilities and other health care: Secondary | ICD-10-CM

## 2019-06-05 DIAGNOSIS — G932 Benign intracranial hypertension: Secondary | ICD-10-CM

## 2019-06-05 MED ORDER — EPINEPHRINE 0.3 MG/0.3ML IJ SOAJ: 0.3000 mg | INTRAMUSCULAR | 99 refills | Status: AC | PRN

## 2019-06-05 MED ORDER — AMLODIPINE BESYLATE 5 MG PO TABS: 10.0000 mg | ORAL_TABLET | Freq: Every day | ORAL | 5 refills | Status: DC

## 2019-06-05 MED ORDER — ATORVASTATIN CALCIUM 20 MG PO TABS: 20.0000 mg | ORAL_TABLET | Freq: Every day | ORAL | 5 refills | Status: DC

## 2019-06-05 MED ORDER — LABETALOL HCL 300 MG PO TABS: 600.0000 mg | ORAL_TABLET | Freq: Two times a day (BID) | ORAL | 5 refills | Status: DC

## 2019-06-05 NOTE — Progress Notes (Signed)
Established Patient Office Visit  Subjective:  Patient ID: Megan Baxter, female    DOB: [birth year]  Age: 48 y.o. MRN: [id number]  CC:  Chief Complaint  Patient presents with  . Hypertension  . Hyperlipidemia  . Vitamin D Deficiency    HPI Megan Baxter, 48 year old female who presents for follow-up of chronic medical issues.  Patient refused use of [employer] video interpretation system as she initially stated that her husband was in the lobby and when I went to the lobby to look for her husband, he was not there and [employer] video interpretation system was brought into the room which patient stated she did not wish to use.  I did have interpreter explained to patient that her husband was not in the lobby and patient then called her husband on the phone to return to the office to service her interpreter.         Through interpretation by her husband, patient denies any current issues with headaches or dizziness, no chest pain or palpitations and no shortness of breath or cough.  Patient did have urgent care and then emergency department visit after having onset of swelling in her neck, face, throat and lips.  Patient states that she felt as if she could not swallow and was having difficulty breathing.  Patient had cell phone pictures of the swelling in her face/lower lip and neck.  Per husband, patient has taken the prednisone and Benadryl prescribed at the emergency department however she recently had new onset of a rash on her left arm which went away with the use of Benadryl.  Patient thought that her spironolactone might be causing the new rash therefore she stopped this medication as well.  At the emergency department, she had been told to stop lisinopril-HCTZ as this might be the cause of her swelling.  Patient's husband had patient's medical bottles at today's visit.  Bottle was still present for lisinopril hydrochlorothiazide and discussed with patient and husband that this was the medication  that was discontinued at the emergency department.  Patient had empty bottles for atorvastatin as well as empty bottles for amlodipine.  Per husband, patient has been out of amlodipine about 1-1/2 to 2 weeks as he states that he asked for refill of the pharmacy and the pharmacy was to contact the doctor's office but husband reports that he never heard anything back regarding refill of the medication.  She has not been taking spironolactone and has been out of amlodipine therefore her blood pressure is high at today's visit.  Patient did have a home blood pressure monitor in the past but the monitor is not currently working.         Patient also has history of low vitamin D level earlier in the year and she wonders if she needs to have refill of vitamin D.  She also has been seen by a neurologist with [facility] since her last visit at this office.  Per after visit summary report from neurology, she was seen in follow-up of pseudotumor cerebri.  Patient denies any current issues with her vision, no headaches or dizziness and no syncopal or presyncopal episodes.  Discussed with patient that she has had some blood sugars on past labs that have been elevated.  She denies any increased thirst or urinary frequency.  Past Medical History:  Diagnosis Date  . Heartburn in pregnancy   . Hypertension   . Maternal anemia in pregnancy, antepartum 02/18/2015    Past Surgical History:  Procedure Laterality Date  . CESAREAN SECTION N/A 04/26/2013   Procedure: CESAREAN SECTION;  Surgeon: Alwyn Pea, MD;  Location: [facility];  Service: Obstetrics;  Laterality: N/A;  . CESAREAN SECTION N/A 05/24/2015   Procedure: CESAREAN SECTION;  Surgeon: Everett Graff, MD;  Location: [facility];  Service: Obstetrics;  Laterality: N/A;  . NO PAST SURGERIES      Family History  Problem Relation Age of Onset  . Healthy Father   . Hypertension Mother   . Alcohol abuse Neg Hx   . Arthritis Neg Hx   . Asthma Neg Hx   . Birth  defects Neg Hx   . Cancer Neg Hx   . COPD Neg Hx   . Depression Neg Hx   . Diabetes Neg Hx   . Drug abuse Neg Hx   . Early death Neg Hx   . Hearing loss Neg Hx   . Heart disease Neg Hx   . Hyperlipidemia Neg Hx   . Kidney disease Neg Hx   . Learning disabilities Neg Hx   . Mental illness Neg Hx   . Mental retardation Neg Hx   . Miscarriages / Stillbirths Neg Hx   . Stroke Neg Hx   . Vision loss Neg Hx     Social History   Socioeconomic History  . Marital status: Married    Spouse name: Not on file  . Number of children: Not on file  . Years of education: Not on file  . Highest education level: Not on file  Occupational History  . Not on file  Social Needs  . Financial resource strain: Not on file  . Food insecurity    Worry: Not on file    Inability: Not on file  . Transportation needs    Medical: Not on file    Non-medical: Not on file  Tobacco Use  . Smoking status: Never Smoker  . Smokeless tobacco: Never Used  Substance and Sexual Activity  . Alcohol use: No  . Drug use: No  . Sexual activity: Yes  Lifestyle  . Physical activity    Days per week: Not on file    Minutes per session: Not on file  . Stress: Not on file  Relationships  . Social Herbalist on phone: Not on file    Gets together: Not on file    Attends religious service: Not on file    Active member of club or organization: Not on file    Attends meetings of clubs or organizations: Not on file    Relationship status: Not on file  . Intimate partner violence    Fear of current or ex partner: Not on file    Emotionally abused: Not on file    Physically abused: Not on file    Forced sexual activity: Not on file  Other Topics Concern  . Not on file  Social History Narrative  . Not on file    Outpatient Medications Prior to Visit  Medication Sig Dispense Refill  . acetaZOLAMIDE (DIAMOX) 500 MG capsule Take 1 capsule by mouth 2 (two) times daily.    Marland Kitchen amLODipine (NORVASC) 5 MG  tablet Take 2 tablets (10 mg total) by mouth daily. 60 tablet 0  . atorvastatin (LIPITOR) 20 MG tablet Take 1 tablet (20 mg total) by mouth daily. 30 tablet 0  . diphenhydrAMINE (BENADRYL) 25 MG tablet Take 1 tablet (25 mg total) by mouth every 6 (six) hours as needed. [place]  tablet 0  . labetalol (NORMODYNE) 300 MG tablet Take 2 tablets (600 mg total) by mouth 2 (two) times daily. 120 tablet 0  . spironolactone (ALDACTONE) 25 MG tablet Take 1 tablet (25 mg total) by mouth daily. 90 tablet 3  . Cholecalciferol (VITAMIN D-3) 125 MCG (5000 UT) TABS Take 1 tablet by mouth daily. 90 tablet 3  . ferrous sulfate 325 (65 FE) MG tablet Take 1 tablet (325 mg total) by mouth 2 (two) times daily with a meal. 180 tablet 3  . methylPREDNISolone (MEDROL DOSEPAK) 4 MG TBPK tablet Take as directed on the pack 1 each 0   No facility-administered medications prior to visit.     No Known Allergies  ROS Review of Systems  Constitutional: Positive for fatigue (Occasional). Negative for chills and fever.  HENT: Negative for sore throat and trouble swallowing.   Eyes: Negative for photophobia and visual disturbance.  Respiratory: Negative for cough and shortness of breath.   Cardiovascular: Negative for chest pain, palpitations and leg swelling.  Gastrointestinal: Positive for abdominal distention (Complain of feeling as if her abdomen is getting bigger/distended). Negative for abdominal pain, blood in stool, constipation, diarrhea and nausea.  Endocrine: Negative for cold intolerance, heat intolerance, polydipsia, polyphagia and polyuria.  Genitourinary: Negative for dysuria and frequency.  Musculoskeletal: Negative for arthralgias and back pain.  Skin: Positive for rash. Negative for wound.  Neurological: Negative for dizziness and headaches.  Hematological: Negative for adenopathy. Does not bruise/bleed easily.      Objective:    Physical Exam  Constitutional: She is oriented to person, place, and time.  She appears well-developed and well-nourished.  Well-nourished well-developed female in no acute distress patient is wearing glasses and wearing her mask as per office COVID-19 protocol.  Patient is accompanied by her husband who acts as Optometrist  Neck: Neck supple. No JVD present. No thyromegaly present.  Cardiovascular: Normal rate and regular rhythm.  Pulmonary/Chest: Breath sounds normal.  Abdominal: Soft. There is no abdominal tenderness. There is no rebound and no guarding.  Musculoskeletal: Normal range of motion.        General: No tenderness or edema.     Comments: No CVA tenderness  Lymphadenopathy:    She has no cervical adenopathy.  Neurological: She is alert and oriented to person, place, and time.  Skin: Skin is warm and dry. No rash noted.  Psychiatric: She has a normal mood and affect. Her behavior is normal.  Nursing note and vitals reviewed.   BP (!) 155/83   Pulse 73   Temp (!) 97.2 F (36.2 C) (Temporal)   Resp 17   Wt 159 lb 12.8 oz (72.5 kg)   LMP 05/25/2019 (Exact Date)   SpO2 98%   BMI 25.03 kg/m  Wt Readings from Last 3 Encounters:  06/05/19 159 lb 12.8 oz (72.5 kg)  05/07/19 161 lb (73 kg)  08/16/18 154 lb 12.8 oz (70.2 kg)         Lab Results  Component Value Date   TSH 0.671 08/24/2018   Lab Results  Component Value Date   WBC 9.7 05/14/2019   HGB 11.6 (L) 05/14/2019   HCT 36.2 05/14/2019   MCV 78.0 (L) 05/14/2019   PLT 430 (H) 05/14/2019   Lab Results  Component Value Date   NA 138 05/14/2019   K 3.1 (L) 05/14/2019   CO2 25 05/14/2019   GLUCOSE 124 (H) 05/14/2019   BUN 14 05/14/2019   CREATININE 0.87 05/14/2019   BILITOT  0.3 12/09/2017   ALKPHOS 82 12/09/2017   AST 12 12/09/2017   ALT 14 12/09/2017   PROT 7.2 12/09/2017   ALBUMIN 4.3 12/09/2017   CALCIUM 9.7 05/14/2019   ANIONGAP 13 05/14/2019   Lab Results  Component Value Date   CHOL 160 08/24/2018   Lab Results  Component Value Date   HDL 59 08/24/2018   Lab  Results  Component Value Date   LDLCALC 90 08/24/2018   Lab Results  Component Value Date   TRIG 53 08/24/2018   Lab Results  Component Value Date   CHOLHDL 2.7 08/24/2018   Lab Results  Component Value Date   HGBA1C 5.3 08/24/2018      Assessment & Plan:  1. Uncontrolled hypertension; angioedema; encounter for examination following treatment at hospital Blood pressure is elevated at today's visit but patient has been out of amlodipine for almost 2 weeks and she stopped the use of spirinolactone due to a rash. She was recently seen in the [facility] urgent care on 05/07/2019 due to an urticarial rash and swelling of her lower lip which was treated with Solu-Medrol injection and Benadryl but patient then had to seek medical attention at the [facility] emergency department on 05/14/2019 due to swelling of the neck/throat and lips.  She was treated with EpiPen, Benadryl, Solu-Medrol and Decadron as well as Pepcid.  She was provided prescription for EpiPen, prednisone and Benadryl.  She was told to stop the use of lisinopril-hydrochlorothiazide.  Apparently, patient also experienced rash to the left upper arm after stopping lisinopril -hydrochlorothiazide and patient thought that the rash might be related to spironolactone so she has also stopped taking this medication. - amLODipine (NORVASC) 5 MG tablet; Take 2 tablets (10 mg total) by mouth daily.  Dispense: 60 tablet; Refill: 5 - labetalol (NORMODYNE) 300 MG tablet; Take 2 tablets (600 mg total) by mouth 2 (two) times daily.  Dispense: 120 tablet; Refill: 5  2. Dyslipidemia Refill provided of atorvastatin and patient will have comprehensive metabolic panel done in follow-up of long-term use of statin medication as well as medications for treatment of hypertension. - Comprehensive metabolic panel  3. Vitamin D deficiency Patient has had prior vitamin D deficiency on blood work done in January of this year and she will have repeat vitamin D  level at today's visit and will be notified if any further treatment is needed based on the results - Vitamin D, 25-hydroxy  4. Pseudotumor cerebri Recent note from patient's neurologist reviewed.  Patient is status post fluoroscopically guided lumbar puncture on 05/22/2019.  Patient saw neurology on 05/24/2019 after being referred due to papilledema seen on eye exam by optometrist.  Patient at that time was also having chronic headaches but denies any current headaches at today's visit.  Per note, she was started on Diamox and was to continue this along with spironolactone, labetalol and amlodipine.  Patient has 27-month follow-up with neurologist. - Comprehensive metabolic panel - amLODipine (NORVASC) 5 MG tablet; Take 2 tablets (10 mg total) by mouth daily.  Dispense: 60 tablet; Refill: 5 - labetalol (NORMODYNE) 300 MG tablet; Take 2 tablets (600 mg total) by mouth 2 (two) times daily.  Dispense: 120 tablet; Refill: 5  5. Elevated glucose level Patient had hemoglobin level of 125 earlier in the year but hemoglobin A1c done in January 2020 was within normal.  We will repeat glucose as part of comprehensive metabolic panel at today's visit as well as repeat of hemoglobin A1c.  She will  be notified of the results and if any further follow-up or interventions are needed based on the results. - Comprehensive metabolic panel - Hemoglobin A1c  6. Encounter for long-term (current) use of medications She will have comprehensive metabolic panel done in follow-up of long-term use of medications for the treatment of hypertension and hyperlipidemia - Comprehensive metabolic panel  7. Angioedema, subsequent encounter; 8.  Encounter for examination following treatment at hospital Patient status post urgent care visit on 05/07/2019 and then second visit on 05/14/2019 to the emergency department for severe allergic reaction/angioedema.  Prescription provided for EpiPen as husband does not recall receiving this  prescription at hospital discharge.  Referral placed to allergist for further evaluation and treatment if needed. - Ambulatory referral to Allergy - EPINEPHrine 0.3 mg/0.3 mL IJ SOAJ injection; Inject 0.3 mLs (0.3 mg total) into the muscle as needed for anaphylaxis.  Dispense: 1 each; Refill: prn  9.  Language barrier Patient refused the use of [employer] video interpretation system at today's visit and instead wished to have her husband translate which was not the most optimal situation and may have led to some misunderstandings or miscommunications of important medical information regarding angioedema/allergic reactions.   An After Visit Summary was printed and given to the patient.  Follow-up: Return in about 4 months (around 10/03/2019) for HTN and as needed.    Antony Blackbird, MD

## 2019-06-05 NOTE — Patient Instructions (Addendum)
Hypertension, Adult Hypertension is another name for high blood pressure. High blood pressure forces your heart to work harder to pump blood. This can cause problems over time. There are two numbers in a blood pressure reading. There is a top number (systolic) over a bottom number (diastolic). It is best to have a blood pressure that is below 120/80. Healthy choices can help lower your blood pressure, or you may need medicine to help lower it. What are the causes? The cause of this condition is not known. Some conditions may be related to high blood pressure. What increases the risk?  Smoking.  Having type 2 diabetes mellitus, high cholesterol, or both.  Not getting enough exercise or physical activity.  Being overweight.  Having too much fat, sugar, calories, or salt (sodium) in your diet.  Drinking too much alcohol.  Having long-term (chronic) kidney disease.  Having a family history of high blood pressure.  Age. Risk increases with age.  Race. You may be at higher risk if you are African American.  Gender. Men are at higher risk than women before age 45. After age 65, women are at higher risk than men.  Having obstructive sleep apnea.  Stress. What are the signs or symptoms?  High blood pressure may not cause symptoms. Very high blood pressure (hypertensive crisis) may cause: ? Headache. ? Feelings of worry or nervousness (anxiety). ? Shortness of breath. ? Nosebleed. ? A feeling of being sick to your stomach (nausea). ? Throwing up (vomiting). ? Changes in how you see. ? Very bad chest pain. ? Seizures. How is this treated?  This condition is treated by making healthy lifestyle changes, such as: ? Eating healthy foods. ? Exercising more. ? Drinking less alcohol.  Your health care provider may prescribe medicine if lifestyle changes are not enough to get your blood pressure under control, and if: ? Your top number is above 130. ? Your bottom number is above  80.  Your personal target blood pressure may vary. Follow these instructions at home: Eating and drinking   If told, follow the DASH eating plan. To follow this plan: ? Fill one half of your plate at each meal with fruits and vegetables. ? Fill one fourth of your plate at each meal with whole grains. Whole grains include whole-wheat pasta, brown rice, and whole-grain bread. ? Eat or drink low-fat dairy products, such as skim milk or low-fat yogurt. ? Fill one fourth of your plate at each meal with low-fat (lean) proteins. Low-fat proteins include fish, chicken without skin, eggs, beans, and tofu. ? Avoid fatty meat, cured and processed meat, or chicken with skin. ? Avoid pre-made or processed food.  Eat less than 1,500 mg of salt each day.  Do not drink alcohol if: ? Your doctor tells you not to drink. ? You are pregnant, may be pregnant, or are planning to become pregnant.  If you drink alcohol: ? Limit how much you use to:  0-1 drink a day for women.  0-2 drinks a day for men. ? Be aware of how much alcohol is in your drink. In the [place] one drink equals one 12 oz bottle of beer (355 mL), one 5 oz glass of wine (148 mL), or one 1 oz glass of hard liquor (44 mL). Lifestyle   Work with your doctor to stay at a healthy weight or to lose weight. Ask your doctor what the best weight is for you.  Get at least 30 minutes of exercise most   days of the week. This may include walking, swimming, or biking.  Get at least 30 minutes of exercise that strengthens your muscles (resistance exercise) at least 3 days a week. This may include lifting weights or doing Pilates.  Do not use any products that contain nicotine or tobacco, such as cigarettes, e-cigarettes, and chewing tobacco. If you need help quitting, ask your doctor.  Check your blood pressure at home as told by your doctor.  Keep all follow-up visits as told by your doctor. This is important. Medicines  Take over-the-counter  and prescription medicines only as told by your doctor. Follow directions carefully.  Do not skip doses of blood pressure medicine. The medicine does not work as well if you skip doses. Skipping doses also puts you at risk for problems.  Ask your doctor about side effects or reactions to medicines that you should watch for. Contact a doctor if you:  Think you are having a reaction to the medicine you are taking.  Have headaches that keep coming back (recurring).  Feel dizzy.  Have swelling in your ankles.  Have trouble with your vision. Get help right away if you:  Get a very bad headache.  Start to feel mixed up (confused).  Feel weak or numb.  Feel faint.  Have very bad pain in your: ? Chest. ? Belly (abdomen).  Throw up more than once.  Have trouble breathing. Summary  Hypertension is another name for high blood pressure.  High blood pressure forces your heart to work harder to pump blood.  For most people, a normal blood pressure is less than 120/80.  Making healthy choices can help lower blood pressure. If your blood pressure does not get lower with healthy choices, you may need to take medicine. This information is not intended to replace advice given to you by your health care provider. Make sure you discuss any questions you have with your health care provider. Document Released: 12/30/2007 Document Revised: 03/23/2018 Document Reviewed: 03/23/2018 Elsevier Patient Education  [place].  Angioedema  Angioedema is sudden swelling in the body. The swelling can happen in any part of the body. It often happens on the skin and causes itchy, bumpy patches (hives) to form. This condition may:  Happen only one time.  Happen more than one time. It may come back at random times.  Keep coming back for a number of years. Someday it may stop coming back. Follow these instructions at home:  Take over-the-counter and prescription medicines only as told by  your doctor.  If you were given medicines for emergency allergy treatment, always carry them with you.  Wear a medical bracelet as told by your doctor.  Avoid the things that cause your attacks (triggers).  If this condition was passed to you from your parents and you want to have kids, talk to your doctor. Your kids may also have this condition. Contact a doctor if:  You have another attack.  Your attacks happen more often, even after you take steps to prevent them.  This condition was passed to you by your parents and you want to have kids. Get help right away if:  Your mouth, tongue, or lips get very swollen.  You have trouble breathing.  You have trouble swallowing.  You pass out (faint). This information is not intended to replace advice given to you by your health care provider. Make sure you discuss any questions you have with your health care provider. Document Released: 07/01/2009 Document Revised:  06/25/2017 Document Reviewed: 01/21/2016 Elsevier Patient Education  [place].  How to Use an Auto-Injector Pen An auto-injector pen (pre-filled automatic epinephrine injection device) is a device that is used to deliver epinephrine to the body. Epinephrine is a medicine that is given as a shot (injection). It works by relaxing the muscles in the airways and tightening the blood vessels. It is used to treat:  A life-threatening allergic reaction (anaphylaxis).  Serious breathing problems, such as severe asthma attacks, some lung problems, and other emergency conditions. An epinephrine injection can save your life. You should always carry an auto-injector pen with you if you are at risk for severe asthma attacks or anaphylaxis. You may hear other names for an auto-injector pen. They are epinephrine injection, epinephrine auto-injector pen, epinephrine pen, and automatic injection device. What are the risks? Using the auto-injector pen is safe. However, problems may  arise, including:  Damage to bone or tissue. Make sure that you correctly place the needle in the muscle of your outer thigh as told by your health care provider. When should I use my auto-injector pen? Use your auto-injector pen as soon as you think you are experiencing anaphylaxis or a severe asthma attack. Anaphylaxis is very dangerous if it is not treated right away. Signs and symptoms of anaphylaxis may include:  Feeling warm in the face (flushed). This may include redness.  Itchy, red, swollen areas of skin (hives).  Swelling of the eyes, lips, face, mouth, tongue, or throat.  Difficulty breathing, speaking, or swallowing.  Noisy breathing (wheezing).  Dizziness or light-headedness.  Fainting.  Pain or cramping in the abdomen.  Vomiting.  Diarrhea. These symptoms may represent a serious problem that is an emergency. Do not wait to see if the symptoms will go away. Use your auto-injector pen as you have been instructed, and get medical help right away. Call your local emergency services (911 in the [place] Do not drive yourself to the hospital. General tips for using an auto-injector pen   Use epinephrine exactly as told by your health care provider. Do not inject it more often or in greater or smaller doses than your health care provider told you. Most auto-injector pens contain one dose of epinephrine. Some contain two doses.  Use the auto-injector pen to give yourself an injection under your skin or into your muscle on the outer side of your thigh. Do not give yourself an injection into your buttocks or any other part of your body. ? In an emergency, you can use your auto-injector pen through your clothing. ? After you inject a dose of epinephrine, some liquid may remain in your auto-injector pen. This is normal.  If you need to give yourself a second dose of epinephrine, give the second injection in another area on your outer thigh. Do not give two injections in exactly  the same place on your body. This can lead to tissue damage.  From time to time: ? Check the expiration date on your auto-injector pen. ? Check the solution to ensure that it is not cloudy and that there are no particles floating in it. If your auto-injector pen is expired or if the solution is cloudy or has particles floating in it, throw it away and get a new one.  Ask your health care provider how to safely get rid of used or expired auto-injector pens.  Talk with your pharmacist or health care provider if you have questions about how to inject epinephrine correctly. Get help right  away if:  You inject epinephrine. You may need additional medical care, and you may need to be monitored for the side effects of epinephrine. The side effects include: ? Difficulty breathing. ? Fast or irregular heartbeat. ? Nausea or vomiting. ? Sweating. ? Dizziness. ? Nervousness or anxiety. ? Weakness. ? Pale skin. ? Headache. ? Shaking that does not stop. Summary  An auto-injector pen (pre-filled automatic epinephrine injection device) is a device that is used to deliver epinephrine to the body.  An auto-injector pen is used to treat a life-threatening allergic reaction (anaphylaxis), asthma attack, or other emergency conditions.  You should always carry an auto-injector pen with you if you are at risk for anaphylaxis or severe asthma attacks.  Use of this device is safe. However, bone or tissue damage can occur if you do not follow instructions for injecting the medicine.  Talk with your pharmacist or health care provider if you have questions about how to inject epinephrine correctly. This information is not intended to replace advice given to you by your health care provider. Make sure you discuss any questions you have with your health care provider. Document Released: 07/10/2000 Document Revised: 08/24/2018 Document Reviewed: 08/24/2018 Elsevier Patient Education  2020 [employer].

## 2019-06-05 NOTE — Progress Notes (Signed)
Declines using [employer] interpreter. Called husband to interpret.  Doesn't check BP at home. Denies chest pain, SHOB, palpitations, dizziness, lower extremity swelling.  Lisinopril-HCTZ was stopped at ED due to angioedema. Was switched to Amlodipine.  Declines flu shot.

## 2019-06-06 LAB — COMPREHENSIVE METABOLIC PANEL WITH GFR
ALT: 11 IU/L (ref 0–32)
AST: 11 IU/L (ref 0–40)
Albumin/Globulin Ratio: 1.5 (ref 1.2–2.2)
Albumin: 3.8 g/dL (ref 3.8–4.8)
Alkaline Phosphatase: 98 IU/L (ref 39–117)
BUN/Creatinine Ratio: 22 (ref 9–23)
BUN: 17 mg/dL (ref 6–24)
Bilirubin Total: <0.2 mg/dL (ref 0.0–1.2)
CO2: 20 mmol/L (ref 20–29)
Calcium: 9.2 mg/dL (ref 8.7–10.2)
Chloride: 111 mmol/L — ABNORMAL HIGH (ref 96–106)
Creatinine, Ser: 0.76 mg/dL (ref 0.57–1.00)
GFR calc Af Amer: 109 mL/min/1.73
GFR calc non Af Amer: 94 mL/min/1.73
Globulin, Total: 2.6 g/dL (ref 1.5–4.5)
Glucose: 100 mg/dL — ABNORMAL HIGH (ref 65–99)
Potassium: 3.6 mmol/L (ref 3.5–5.2)
Sodium: 142 mmol/L (ref 134–144)
Total Protein: 6.4 g/dL (ref 6.0–8.5)

## 2019-06-06 LAB — HEMOGLOBIN A1C
Est. average glucose Bld gHb Est-mCnc: 126 mg/dL
Hgb A1c MFr Bld: 6 % — ABNORMAL HIGH (ref 4.8–5.6)

## 2019-06-06 LAB — VITAMIN D 25 HYDROXY (VIT D DEFICIENCY, FRACTURES): Vit D, 25-Hydroxy: 12.9 ng/mL — ABNORMAL LOW (ref 30.0–100.0)

## 2019-06-10 ENCOUNTER — Other Ambulatory Visit (HOSPITAL_BASED_OUTPATIENT_CLINIC_OR_DEPARTMENT_OTHER): Payer: BC Managed Care – PPO | Admitting: Family Medicine

## 2019-06-10 DIAGNOSIS — E559 Vitamin D deficiency, unspecified: Secondary | ICD-10-CM | POA: Insufficient documentation

## 2019-06-10 DIAGNOSIS — R7303 Prediabetes: Secondary | ICD-10-CM | POA: Insufficient documentation

## 2019-06-10 MED ORDER — VITAMIN D (ERGOCALCIFEROL) 1.25 MG (50000 UNIT) PO CAPS: 50000.0000 [IU] | ORAL_CAPSULE | ORAL | 4 refills | Status: DC

## 2019-06-10 NOTE — Progress Notes (Signed)
Patient ID: Megan Baxter, female   DOB: October 07, 1970, 48 y.o.   MRN: [id number]   Patient with recent blood work showing hemoglobin A1c of 6.0 consistent with prediabetes.  She will be referred for diabetic education.  Patient also with continued low vitamin D level of 12.9 and prescription will be sent to her pharmacy for vitamin D therapy.

## 2019-06-13 ENCOUNTER — Other Ambulatory Visit: Payer: Self-pay

## 2019-06-13 ENCOUNTER — Encounter: Payer: Self-pay | Admitting: Registered"

## 2019-06-13 ENCOUNTER — Encounter: Payer: BC Managed Care – PPO | Attending: Family Medicine | Admitting: Registered"

## 2019-06-13 DIAGNOSIS — R7303 Prediabetes: Secondary | ICD-10-CM | POA: Insufficient documentation

## 2019-06-13 NOTE — Progress Notes (Signed)
Medical Nutrition Therapy:  Appt start time: [id number] end time:  1505.  Patient does not speak [place], Husband provided interpretation; signed waiver.  Assessment:  Primary concerns today: prediabetes: A1c 6.0%  Patient states she would like to understand what her lab result means and what type of diabetes she has.   Patient works nights; 10p-7a 5 days per week. On days off she does not have regular sleep pattern.  Per MD notes, Vit D 12.9. Patient's husband said they just picked up prescription. RD reiterated that the high does vitamin is to be taken 1x/week, not daily. Husband states they will make sure to read the directions.  Preferred Learning Style:   No preference indicated   Learning Readiness:   Contemplating   MEDICATIONS: reviewed. RD is not clear why there is an Epi pen in med list since patient denies any allergies that would require an injection.   DIETARY INTAKE:  Usual eating pattern includes 2-3 meals and 0-1 snacks per day.  Everyday foods include rice.     24-hr recall:  B ( AM): tea, sugar when gets off works (night shift ) Snk ( AM):   L ( PM): eggs, potato Snk ( PM):  D ( PM): rice, meat, fries OR salad Snk ( PM):  Beverages: water, tea  Usual physical activity: ADLs  Progress Towards Goal(s):  New goals.   Nutritional Diagnosis:  NI-5.8.2 Excessive carbohydrate intake As related to large servings of rice/potato.  As evidenced by dietary recall, A1c 6%.    Intervention:  Nutrition education for managing blood glucose with diet and lifestyle changes. Described diabetes. Defined the role of glucose and insulin.  Identified type 2 diabetes pathophysiology. Described the role of different macronutrients on glucose.  Explained how carbohydrates affect blood glucose.  Stated what foods contain the most carbohydrates.  Demonstrated carbohydrate counting.  Discussed importance of sleep.  Plan: Aim to eat 30-45 g carbohydrates with balanced meal. Aim to get  regular exercise, 3-5 x/week for 30-45 min ideal. Consider tips for better sleep from the sleep foundation website.  Teaching Method Utilized:  Visual Auditory Hands on  Handouts given during visit include:  Pages from [place] version of GDM packet that provides carb lists and serving sizes  A1c chart  [facility] patient financial resources brochure for insulin (for husband who skips taking insulin d/t financial concerns.)   Barriers to learning/adherence to lifestyle change: ESL  Demonstrated degree of understanding via:  Teach Back   Monitoring/Evaluation:  Dietary intake, exercise, A1c,  and body weight prn.

## 2019-06-13 NOTE — Patient Instructions (Addendum)
Aim to eat 30-45 g carbohydrates with balanced meal. Aim to get regular exercise, 3-5 x/week for 30-45 min ideal. Consider tips for better sleep from the sleep foundation website.

## 2019-10-02 ENCOUNTER — Ambulatory Visit: Payer: BC Managed Care – PPO | Admitting: Internal Medicine

## 2019-11-15 ENCOUNTER — Other Ambulatory Visit: Payer: Self-pay

## 2019-11-15 DIAGNOSIS — G932 Benign intracranial hypertension: Secondary | ICD-10-CM

## 2019-11-15 DIAGNOSIS — I10 Essential (primary) hypertension: Secondary | ICD-10-CM

## 2019-11-15 MED ORDER — AMLODIPINE BESYLATE 5 MG PO TABS: 10.0000 mg | ORAL_TABLET | Freq: Every day | ORAL | 0 refills | Status: DC

## 2019-11-15 MED ORDER — ATORVASTATIN CALCIUM 20 MG PO TABS: 20.0000 mg | ORAL_TABLET | Freq: Every day | ORAL | 0 refills | Status: DC

## 2019-11-15 MED ORDER — LABETALOL HCL 300 MG PO TABS: 600.0000 mg | ORAL_TABLET | Freq: Two times a day (BID) | ORAL | 0 refills | Status: DC

## 2019-11-24 ENCOUNTER — Other Ambulatory Visit: Payer: Self-pay | Admitting: Family Medicine

## 2019-11-24 ENCOUNTER — Ambulatory Visit (INDEPENDENT_AMBULATORY_CARE_PROVIDER_SITE_OTHER): Payer: BC Managed Care – PPO | Admitting: Cardiology

## 2019-11-24 ENCOUNTER — Other Ambulatory Visit: Payer: Self-pay

## 2019-11-24 VITALS — BP 110/70 | HR 74 | Ht 67.0 in | Wt 144.4 lb

## 2019-11-24 DIAGNOSIS — Z1231 Encounter for screening mammogram for malignant neoplasm of breast: Secondary | ICD-10-CM

## 2019-11-24 DIAGNOSIS — G932 Benign intracranial hypertension: Secondary | ICD-10-CM | POA: Insufficient documentation

## 2019-11-24 DIAGNOSIS — I10 Essential (primary) hypertension: Secondary | ICD-10-CM

## 2019-11-24 NOTE — Progress Notes (Signed)
Cardiology Office Note:    Date:  11/24/2019   ID:  Jlah Balthazar, DOB 03-09-1971, MRN [id number]  PCP:  Scot Jun, FNP  Cardiologist:  Candee Furbish, MD  Electrophysiologist:  None   Referring MD: Scot Jun, FNP     History of Present Illness:    Megan Baxter is a 49 y.o. female here for the follow-up of elevated blood pressure, diagnosis of pseudotumor cerebri being seen at [facility], prior cardiomegaly noted on prior CT scan in 2017 of her abdomen.  Scattered iliac calcification/atherosclerosis noted as well.  [place] she had been to cardiology before.  Unremarkable work-up.  They never uncovered anything unusual as an underlying cause for her hypertension.  Recent neurology note 11/01/2019 demonstrated uncontrolled hypertension back and shoulder pain with thoracic degenerative disc disease and headache.  An MRI of the head on 05/10/2019 lumbar puncture was done.  Opening pressure was 330.  Pseudotumor cerebri was diagnosed.  Diamox was discussed.  Overall she is still having some point tenderness over the left mid thoracic posterior region of her back.  Husband states that there has been no indication of any rashes.  She also points to some discomfort in her left breast and abdominal region.  She is not having any shortness of breath.  No fevers no chills.  Ambulating well.  Blood pressure is under much better control currently.  Past Medical History:  Diagnosis Date  . Heartburn in pregnancy   . Hypertension   . Maternal anemia in pregnancy, antepartum 02/18/2015    Past Surgical History:  Procedure Laterality Date  . CESAREAN SECTION N/A 04/26/2013   Procedure: CESAREAN SECTION;  Surgeon: Alwyn Pea, MD;  Location: [facility];  Service: Obstetrics;  Laterality: N/A;  . CESAREAN SECTION N/A 05/24/2015   Procedure: CESAREAN SECTION;  Surgeon: Everett Graff, MD;  Location: [facility];  Service: Obstetrics;  Laterality: N/A;  . NO PAST  SURGERIES      Current Medications: Current Meds  Medication Sig  . acetaZOLAMIDE (DIAMOX) 500 MG capsule Take 1 capsule by mouth 2 (two) times daily.  Marland Kitchen amLODipine (NORVASC) 5 MG tablet Take 2 tablets (10 mg total) by mouth daily.  Marland Kitchen atorvastatin (LIPITOR) 20 MG tablet Take 1 tablet (20 mg total) by mouth daily.  . diphenhydrAMINE (BENADRYL) 25 MG tablet Take 1 tablet (25 mg total) by mouth every 6 (six) hours as needed.  Marland Kitchen EPINEPHrine 0.3 mg/0.3 mL IJ SOAJ injection Inject 0.3 mLs (0.3 mg total) into the muscle as needed for anaphylaxis.  Marland Kitchen labetalol (NORMODYNE) 300 MG tablet Take 2 tablets (600 mg total) by mouth 2 (two) times daily.  Marland Kitchen spironolactone (ALDACTONE) 25 MG tablet Take 1 tablet (25 mg total) by mouth daily.  . Vitamin D, Ergocalciferol, (DRISDOL) 1.25 MG (50000 UT) CAPS capsule Take 1 capsule (50,000 Units total) by mouth every 7 (seven) days.     Allergies:   Patient has no known allergies.   Social History   Socioeconomic History  . Marital status: Married    Spouse name: Not on file  . Number of children: Not on file  . Years of education: Not on file  . Highest education level: Not on file  Occupational History  . Not on file  Tobacco Use  . Smoking status: Never Smoker  . Smokeless tobacco: Never Used  Substance and Sexual Activity  . Alcohol use: No  . Drug use: No  . Sexual activity: Yes  Other Topics Concern  . Not on file  Social History Narrative  . Not on file   Social Determinants of Health   Financial Resource Strain:   . Difficulty of Paying Living Expenses:   Food Insecurity:   . Worried About Charity fundraiser in the Last Year:   . Arboriculturist in the Last Year:   Transportation Needs:   . Film/video editor (Medical):   Marland Kitchen Lack of Transportation (Non-Medical):   Physical Activity:   . Days of Exercise per Week:   . Minutes of Exercise per Session:   Stress:   . Feeling of Stress :   Social Connections:   . Frequency of  Communication with Friends and Family:   . Frequency of Social Gatherings with Friends and Family:   . Attends Religious Services:   . Active Member of Clubs or Organizations:   . Attends Archivist Meetings:   Marland Kitchen Marital Status:      Family History: The patient's family history includes Healthy in her father; Hypertension in her mother. There is no history of Alcohol abuse, Arthritis, Asthma, Birth defects, Cancer, COPD, Depression, Diabetes, Drug abuse, Early death, Hearing loss, Heart disease, Hyperlipidemia, Kidney disease, Learning disabilities, Mental illness, Mental retardation, Miscarriages / Stillbirths, Stroke, or Vision loss.  ROS:   Please see the history of present illness.     All other systems reviewed and are negative.  EKGs/Labs/Other Studies Reviewed:      Recent Labs: 05/14/2019: Hemoglobin 11.6; Platelets 430 06/05/2019: ALT 11; BUN 17; Creatinine, Ser 0.76; Potassium 3.6; Sodium 142  Recent Lipid Panel    Component Value Date/Time   CHOL 160 08/24/2018 0837   TRIG 53 08/24/2018 0837   HDL 59 08/24/2018 0837   CHOLHDL 2.7 08/24/2018 0837   CHOLHDL 3.8 09/30/2015 1024   VLDL 11 09/30/2015 1024   LDLCALC 90 08/24/2018 0837    Physical Exam:    VS:  BP 110/70   Pulse 74   Ht 5\' 7"  (1.702 m)   Wt 144 lb 6.4 oz (65.5 kg)   SpO2 99%   BMI 22.62 kg/m     Wt Readings from Last 3 Encounters:  11/24/19 144 lb 6.4 oz (65.5 kg)  06/05/19 159 lb 12.8 oz (72.5 kg)  05/07/19 161 lb (73 kg)     GEN:  Well nourished, well developed in no acute distress HEENT: Normal NECK: No JVD; No carotid bruits LYMPHATICS: No lymphadenopathy CARDIAC: RRR, no murmurs, rubs, gallops RESPIRATORY:  Clear to auscultation without rales, wheezing or rhonchi  ABDOMEN: Soft, non-tender, non-distended MUSCULOSKELETAL:  No edema; No deformity  SKIN: Warm and dry NEUROLOGIC:  Alert and oriented x 3 PSYCHIATRIC:  Normal affect   ASSESSMENT:    1. Essential hypertension    2. Pseudotumor cerebri    PLAN:    In order of problems listed above:  Essential hypertension -Previously I have utilized amlodipine and spironolactone and labetalol.  Hypertension clinic was offered. -Blood pressure at the neurology office was recently 126/80.  It looks like she was no longer on the spironolactone at that time.  The spironolactone was not on the list.  Acetazolamide or Diamox took its place for the treatment of pseudotumor cerebri.  She had papilledema but decreasing. -Excellent overall control at this point.  I am very pleased.  Back discomfort, breast discomfort, abdominal discomfort -These have been present for quite some time.  I asked him if she is ever had a rash  around her sensitive back area and he did not believe that this has ever been the case.  I was thinking of shingles.  She was also worried about pain in her breast.  She is going to be seeing a new primary care doctor soon in June.  She will be obtaining a mammogram.    Pseudotumor cerebri -Treated at [facility].  Doing well Diamox.  Medication Adjustments/Labs and Tests Ordered: Current medicines are reviewed at length with the patient today.  Concerns regarding medicines are outlined above.  No orders of the defined types were placed in this encounter.  No orders of the defined types were placed in this encounter.   Patient Instructions  Medication Instructions:  The current medical regimen is effective;  continue present plan and medications.  *If you need a refill on your cardiac medications before your next appointment, please call your pharmacy*  Follow-Up: At [facility], you and your health needs are our priority.  As part of our continuing mission to provide you with exceptional heart care, we have created designated Provider Care Teams.  These Care Teams include your primary Cardiologist (physician) and Advanced Practice Providers (APPs -  Physician Assistants and Nurse Practitioners) who  all work together to provide you with the care you need, when you need it.  We recommend signing up for the patient portal called "MyChart".  Sign up information is provided on this After Visit Summary.  MyChart is used to connect with patients for Virtual Visits (Telemedicine).  Patients are able to view lab/test results, encounter notes, upcoming appointments, etc.  Non-urgent messages can be sent to your provider as well.   To learn more about what you can do with MyChart, go to [email or web address].    Follow up as needed with Dr Marlou Porch.  Thank you for choosing [facility]!!         Signed, Candee Furbish, MD  11/24/2019 2:13 PM    [facility]

## 2019-11-24 NOTE — Patient Instructions (Signed)
Medication Instructions:  The current medical regimen is effective;  continue present plan and medications.  *If you need a refill on your cardiac medications before your next appointment, please call your pharmacy*  Follow-Up: At [facility], you and your health needs are our priority.  As part of our continuing mission to provide you with exceptional heart care, we have created designated Provider Care Teams.  These Care Teams include your primary Cardiologist (physician) and Advanced Practice Providers (APPs -  Physician Assistants and Nurse Practitioners) who all work together to provide you with the care you need, when you need it.  We recommend signing up for the patient portal called "MyChart".  Sign up information is provided on this After Visit Summary.  MyChart is used to connect with patients for Virtual Visits (Telemedicine).  Patients are able to view lab/test results, encounter notes, upcoming appointments, etc.  Non-urgent messages can be sent to your provider as well.   To learn more about what you can do with MyChart, go to [email or web address].    Follow up as needed with Dr Skains  Thank you for choosing [facility]!!      

## 2019-11-25 ENCOUNTER — Ambulatory Visit (HOSPITAL_COMMUNITY)
Admission: EM | Admit: 2019-11-25 | Discharge: 2019-11-25 | Disposition: A | Discharge status: Home / Self Care | Payer: BC Managed Care – PPO | Attending: Family Medicine | Admitting: Family Medicine

## 2019-11-25 ENCOUNTER — Other Ambulatory Visit: Payer: Self-pay

## 2019-11-25 ENCOUNTER — Encounter (HOSPITAL_COMMUNITY): Payer: Self-pay | Admitting: *Deleted

## 2019-11-25 DIAGNOSIS — I10 Essential (primary) hypertension: Secondary | ICD-10-CM | POA: Diagnosis not present

## 2019-11-25 DIAGNOSIS — M546 Pain in thoracic spine: Secondary | ICD-10-CM

## 2019-11-25 DIAGNOSIS — R1031 Right lower quadrant pain: Secondary | ICD-10-CM | POA: Diagnosis not present

## 2019-11-25 NOTE — ED Triage Notes (Signed)
EKG shown to K. Kenton Kingfisher, NP.

## 2019-11-25 NOTE — ED Provider Notes (Signed)
[place]    CSN: [id number] Arrival date & time: 11/25/19  1018      History   Chief Complaint No chief complaint on file.   HPI Megan Baxter is a 49 y.o. female.   Back Pain: Patient presents for presents evaluation of mid thoracic back pain.  Symptoms have been present for several years, waxes and wanes, started again 3 days ago and include pain in L side mid thoracic (shooting, stabbing and throbbing in character; 7/10 in severity). Initial inciting event: none.  Alleviating factors identifiable by patient are none. Exacerbating factors identifiable by patient are recumbency. Treatments so far initiated by patient: tylenol   Abdominal Pain: Patient complains of abdominal pain. The pain is described as sharp, and is 8/10 in intensity. Pain is located in the RLQ without radiation. Onset was 3 years ag, waxes and wanes, had gone away for several months then restarted 3 days ago. Symptoms have been gradually worsening since. Aggravating factors: none.  Alleviating factors: none. Associated symptoms: none. The patient denies chills, constipation, diarrhea, dysuria, fever, frequency, headache, hematochezia, hematuria, melena, nausea and vomiting.   Of note BP elevated on exam, recheck 163/94.  Patient denies CP, SOB, dizziness, HA, vision changes.  Saw cardiology yesterday, notes reviewed, BP normal at that visit.       Past Medical History:  Diagnosis Date  . Heartburn in pregnancy   . Hypertension   . Maternal anemia in pregnancy, antepartum 02/18/2015    Patient Active Problem List   Diagnosis Date Noted  . Pseudotumor cerebri 11/24/2019  . Vitamin D deficiency 06/10/2019  . Prediabetes 06/10/2019  . Primary insomnia 12/23/2016  . Dyslipidemia 05/14/2016  . Essential hypertension 11/21/2015  . Absolute anemia 05/23/2015    Past Surgical History:  Procedure Laterality Date  . CESAREAN SECTION N/A 04/26/2013   Procedure: CESAREAN SECTION;  Surgeon: Alwyn Pea, MD;  Location: [facility];  Service: Obstetrics;  Laterality: N/A;  . CESAREAN SECTION N/A 05/24/2015   Procedure: CESAREAN SECTION;  Surgeon: Everett Graff, MD;  Location: [facility];  Service: Obstetrics;  Laterality: N/A;  . NO PAST SURGERIES      OB History    Gravida  4   Para  4   Term  3   Preterm  1   AB  0   Living  3     SAB  0   TAB  0   Ectopic  0   Multiple  0   Live Births  3            Home Medications    Prior to Admission medications   Medication Sig Start Date End Date Taking? Authorizing Provider  acetaZOLAMIDE (DIAMOX) 500 MG capsule Take 1 capsule by mouth 2 (two) times daily. 05/24/19  Yes [provider]  amLODipine (NORVASC) 5 MG tablet Take 2 tablets (10 mg total) by mouth daily. 11/15/19  Yes Nicolette Bang, DO  atorvastatin (LIPITOR) 20 MG tablet Take 1 tablet (20 mg total) by mouth daily. 11/15/19  Yes Nicolette Bang, DO  labetalol (NORMODYNE) 300 MG tablet Take 2 tablets (600 mg total) by mouth 2 (two) times daily. 11/15/19  Yes Nicolette Bang, DO  spironolactone (ALDACTONE) 25 MG tablet Take 1 tablet (25 mg total) by mouth daily. 08/15/18  Yes Jerline Pain, MD  diphenhydrAMINE (BENADRYL) 25 MG tablet Take 1 tablet (25 mg total) by mouth every 6 (six) hours as needed. 05/14/19   Lawsing,  Jeneen Rinks, MD  EPINEPHrine 0.3 mg/0.3 mL IJ SOAJ injection Inject 0.3 mLs (0.3 mg total) into the muscle as needed for anaphylaxis. 06/05/19   Fulp, Cammie, MD  Vitamin D, Ergocalciferol, (DRISDOL) 1.25 MG (50000 UT) CAPS capsule Take 1 capsule (50,000 Units total) by mouth every 7 (seven) days. 06/10/19   Fulp, Cammie, MD  lisinopril-hydrochlorothiazide (ZESTORETIC) 20-12.5 MG tablet Take 2 tablets by mouth daily. 05/01/19 05/14/19  Antony Blackbird, MD    Family History Family History  Problem Relation Age of Onset  . Healthy Father   . Hypertension Mother   . Alcohol abuse Neg Hx   . Arthritis Neg Hx   . Asthma  Neg Hx   . Birth defects Neg Hx   . Cancer Neg Hx   . COPD Neg Hx   . Depression Neg Hx   . Diabetes Neg Hx   . Drug abuse Neg Hx   . Early death Neg Hx   . Hearing loss Neg Hx   . Heart disease Neg Hx   . Hyperlipidemia Neg Hx   . Kidney disease Neg Hx   . Learning disabilities Neg Hx   . Mental illness Neg Hx   . Mental retardation Neg Hx   . Miscarriages / Stillbirths Neg Hx   . Stroke Neg Hx   . Vision loss Neg Hx     Social History Social History   Tobacco Use  . Smoking status: Never Smoker  . Smokeless tobacco: Never Used  Substance Use Topics  . Alcohol use: No  . Drug use: No     Allergies   Drug class [pork-derived products]   Review of Systems Review of Systems  Constitutional: Negative for chills and fever.  HENT: Negative for ear pain and sore throat.   Eyes: Negative for pain and visual disturbance.  Respiratory: Negative for cough and shortness of breath.   Cardiovascular: Negative for chest pain and palpitations.  Gastrointestinal: Positive for abdominal pain. Negative for abdominal distention, blood in stool, constipation, diarrhea, nausea and vomiting.  Genitourinary: Negative for difficulty urinating, dyspareunia, dysuria, flank pain, frequency, hematuria, menstrual problem, pelvic pain, urgency, vaginal bleeding, vaginal discharge and vaginal pain.  Musculoskeletal: Positive for back pain. Negative for arthralgias, gait problem, myalgias, neck pain and neck stiffness.  Skin: Negative for color change and rash.  Neurological: Negative for seizures and syncope.  Psychiatric/Behavioral: Negative for confusion and sleep disturbance.  All other systems reviewed and are negative.    Physical Exam Triage Vital Signs ED Triage Vitals  Enc Vitals Group     BP 11/25/19 1037 (!) 156/99     Pulse Rate 11/25/19 1037 74     Resp 11/25/19 1037 18     Temp 11/25/19 1037 98.7 F (37.1 C)     Temp Source 11/25/19 1037 Oral     SpO2 11/25/19 1037 100 %      Weight --      Height --      Head Circumference --      Peak Flow --      Pain Score 11/25/19 1042 10     Pain Loc --      Pain Edu? --      Excl. in [place]? --    No data found.  Updated Vital Signs BP (!) 156/99   Pulse 74   Temp 98.7 F (37.1 C) (Oral)   Resp 18   LMP 11/03/2019 (Exact Date)   SpO2 100%   Visual Acuity Right  Eye Distance:   Left Eye Distance:   Bilateral Distance:    Right Eye Near:   Left Eye Near:    Bilateral Near:     Physical Exam Vitals and nursing note reviewed.  Constitutional:      General: She is not in acute distress.    Appearance: Normal appearance. She is well-developed.  HENT:     Head: Normocephalic and atraumatic.     Nose: Nose normal.  Eyes:     General: No scleral icterus.    Extraocular Movements: Extraocular movements intact.     Conjunctiva/sclera: Conjunctivae normal.  Cardiovascular:     Rate and Rhythm: Normal rate and regular rhythm.     Heart sounds: No murmur.  Pulmonary:     Effort: Pulmonary effort is normal. No respiratory distress.     Breath sounds: Normal breath sounds.  Abdominal:     General: Abdomen is flat. Bowel sounds are normal.     Palpations: Abdomen is soft.     Tenderness: There is abdominal tenderness in the right lower quadrant. There is no right CVA tenderness, left CVA tenderness, guarding or rebound. Negative signs include Murphy's sign, Rovsing's sign, McBurney's sign and obturator sign.     Hernia: No hernia is present.  Musculoskeletal:     Cervical back: Neck supple.     Thoracic back: Tenderness present. No swelling, edema, deformity, signs of trauma, spasms or bony tenderness. Normal range of motion.       Back:  Skin:    General: Skin is warm and dry.     Capillary Refill: Capillary refill takes less than 2 seconds.  Neurological:     General: No focal deficit present.     Mental Status: She is alert and oriented to person, place, and time.  Psychiatric:        Mood and  Affect: Mood normal.        Behavior: Behavior normal.      UC Treatments / Results  Labs (all labs ordered are listed, but only abnormal results are displayed) Labs Reviewed - No data to display  EKG   Radiology No results found.  Procedures Procedures (including critical care time)  Medications Ordered in UC Medications - No data to display  Initial Impression / Assessment and Plan / UC Course  I have reviewed the triage vital signs and the nursing notes.  Pertinent labs & imaging results that were available during my care of the patient were reviewed by me and considered in my medical decision making (see chart for details).     ED precautions provided.   Follow up with PCP for further evaluation of abdominal and back pain. Take tylenol as needed for pain. Follow up with cardiology for management of HTN.  Final Clinical Impressions(s) / UC Diagnoses   Final diagnoses:  Right lower quadrant abdominal pain  Dorsalgia of thoracic region     Discharge Instructions     Follow up with PCP for evaluation of abdominal pain and back pain. Ice back 15 minutes 4 times per day. Take medication as prescribed. Take tylenol for pain. Go to ED with any worsening symptoms or if symptoms fail to improve as expected.   ED Prescriptions    None     PDMP not reviewed this encounter.   Peri Jefferson, PA-C 11/25/19 1153

## 2019-11-25 NOTE — ED Notes (Signed)
Evaluated pt in waiting room with husband assisting with translation. Pt c/o recurrent left scapular back pain, left CP radiating to left arm for approx 2 days. Pt states h/o similar pain symptoms for "several years". Denies dizziness, diaphoresis, n/v, blurred vision, difficulty with speech with CP. States h/o dizziness 2/2 to "fluid on spine and in brain" that has since resolved. Pt describes pain as "burning" 10/10 scale. SPO2 98% and hr 75. Transferred to triage for EKG and eval.  Pt reports being evaluated by cardiologist yesterday.

## 2019-11-25 NOTE — Discharge Instructions (Addendum)
Follow up with PCP for evaluation of abdominal pain and back pain. Ice back 15 minutes 4 times per day. Take medication as prescribed. Take tylenol for pain. Go to ED with any worsening symptoms or if symptoms fail to improve as expected.

## 2019-11-29 ENCOUNTER — Emergency Department (HOSPITAL_COMMUNITY)
Admission: EM | Admit: 2019-11-29 | Discharge: 2019-11-29 | Disposition: A | Discharge status: Home / Self Care | Payer: BC Managed Care – PPO | Attending: Emergency Medicine | Admitting: Emergency Medicine

## 2019-11-29 ENCOUNTER — Emergency Department (HOSPITAL_COMMUNITY): Payer: BC Managed Care – PPO

## 2019-11-29 ENCOUNTER — Encounter (HOSPITAL_COMMUNITY): Payer: Self-pay | Admitting: Emergency Medicine

## 2019-11-29 ENCOUNTER — Other Ambulatory Visit: Payer: Self-pay

## 2019-11-29 DIAGNOSIS — G8929 Other chronic pain: Secondary | ICD-10-CM | POA: Insufficient documentation

## 2019-11-29 DIAGNOSIS — M549 Dorsalgia, unspecified: Secondary | ICD-10-CM | POA: Insufficient documentation

## 2019-11-29 DIAGNOSIS — R109 Unspecified abdominal pain: Secondary | ICD-10-CM | POA: Diagnosis present

## 2019-11-29 DIAGNOSIS — Z79899 Other long term (current) drug therapy: Secondary | ICD-10-CM | POA: Diagnosis not present

## 2019-11-29 DIAGNOSIS — I10 Essential (primary) hypertension: Secondary | ICD-10-CM | POA: Insufficient documentation

## 2019-11-29 DIAGNOSIS — R1011 Right upper quadrant pain: Secondary | ICD-10-CM

## 2019-11-29 LAB — URINALYSIS, ROUTINE W REFLEX MICROSCOPIC
Bilirubin Urine: NEGATIVE
Glucose, UA: NEGATIVE mg/dL
Ketones, ur: NEGATIVE mg/dL
Leukocytes,Ua: NEGATIVE
Nitrite: NEGATIVE
Protein, ur: NEGATIVE mg/dL
RBC / HPF: >50 RBC/hpf — ABNORMAL HIGH (ref 0–5)
Specific Gravity, Urine: 1.023 (ref 1.005–1.030)
pH: 6 (ref 5.0–8.0)

## 2019-11-29 LAB — TROPONIN I (HIGH SENSITIVITY): Troponin I (High Sensitivity): 5 ng/L (ref <18)

## 2019-11-29 LAB — CBC
HCT: 34 % — ABNORMAL LOW (ref 36.0–46.0)
Hemoglobin: 10.5 g/dL — ABNORMAL LOW (ref 12.0–15.0)
MCH: 24.4 pg — ABNORMAL LOW (ref 26.0–34.0)
MCHC: 30.9 g/dL (ref 30.0–36.0)
MCV: 78.9 fL — ABNORMAL LOW (ref 80.0–100.0)
Platelets: 452 10*3/uL — ABNORMAL HIGH (ref 150–400)
RBC: 4.31 MIL/uL (ref 3.87–5.11)
RDW: 16.5 % — ABNORMAL HIGH (ref 11.5–15.5)
WBC: 6 10*3/uL (ref 4.0–10.5)
nRBC: 0 % (ref 0.0–0.2)

## 2019-11-29 LAB — HEPATIC FUNCTION PANEL
ALT: 16 U/L (ref 0–44)
AST: 14 U/L — ABNORMAL LOW (ref 15–41)
Albumin: 3.6 g/dL (ref 3.5–5.0)
Alkaline Phosphatase: 72 U/L (ref 38–126)
Bilirubin, Direct: <0.1 mg/dL (ref 0.0–0.2)
Total Bilirubin: 0.7 mg/dL (ref 0.3–1.2)
Total Protein: 7.1 g/dL (ref 6.5–8.1)

## 2019-11-29 LAB — I-STAT BETA HCG BLOOD, ED (MC, WL, AP ONLY): I-stat hCG, quantitative: <5 m[IU]/mL (ref <5)

## 2019-11-29 LAB — BASIC METABOLIC PANEL
Anion gap: 9 (ref 5–15)
BUN: 15 mg/dL (ref 6–20)
CO2: 18 mmol/L — ABNORMAL LOW (ref 22–32)
Calcium: 9.2 mg/dL (ref 8.9–10.3)
Chloride: 112 mmol/L — ABNORMAL HIGH (ref 98–111)
Creatinine, Ser: 0.89 mg/dL (ref 0.44–1.00)
GFR calc Af Amer: >60 mL/min (ref >60)
GFR calc non Af Amer: >60 mL/min (ref >60)
Glucose, Bld: 123 mg/dL — ABNORMAL HIGH (ref 70–99)
Potassium: 3.9 mmol/L (ref 3.5–5.1)
Sodium: 139 mmol/L (ref 135–145)

## 2019-11-29 LAB — LIPASE, BLOOD: Lipase: 29 U/L (ref 11–51)

## 2019-11-29 MED ORDER — MORPHINE SULFATE (PF) 4 MG/ML IV SOLN
4.0000 mg | Freq: Once | INTRAVENOUS | Status: AC
  Administered 2019-11-29: 19:00:00 4 mg via INTRAVENOUS
  Filled 2019-11-29: qty 1

## 2019-11-29 MED ORDER — METHOCARBAMOL 500 MG PO TABS: 500.0000 mg | ORAL_TABLET | Freq: Two times a day (BID) | ORAL | 0 refills | Status: AC

## 2019-11-29 NOTE — ED Notes (Signed)
Patient verbalizes understanding of discharge instructions. Opportunity for questioning and answers were provided. Armband removed by staff, pt discharged from ED ambulatory.   

## 2019-11-29 NOTE — ED Notes (Signed)
Offered to speak with pt using translator, pt refusing at this time. Prefers speaking through husband.

## 2019-11-29 NOTE — ED Provider Notes (Signed)
[facility] EMERGENCY DEPARTMENT Provider Note   CSN: [id number] Arrival date & time: 11/29/19  1142     History Chief Complaint  Patient presents with   Back Pain    Megan Baxter is a 49 y.o. female history hypertension, dyslipidemia, prediabetes.  Patient presents today for abdominal pain, right-sided sharp intermittent pain moderate intensity no clear aggravating or alleviating factors onset 4-5 days ago, occasionally radiates to her back.  Second concern is upper back pain which she reports has been going on for months, sharp constant worsened with movement and palpation minimally improved with cream.  Denies fever/chills, headache, neck pain, chest pain/shortness of breath, cough/hemoptysis, nausea/vomiting, diarrhea, dysuria/hematuria, extremity swelling/color change, numbness/tingling, weakness or any additional concerns.   Video interpreter used with patient however she refused and told the translator that she only wanted her husband to translate for her. HPI     Past Medical History:  Diagnosis Date   Heartburn in pregnancy    Hypertension    Maternal anemia in pregnancy, antepartum 02/18/2015    Patient Active Problem List   Diagnosis Date Noted   Pseudotumor cerebri 11/24/2019   Vitamin D deficiency 06/10/2019   Prediabetes 06/10/2019   Primary insomnia 12/23/2016   Dyslipidemia 05/14/2016   Essential hypertension 11/21/2015   Absolute anemia 05/23/2015    Past Surgical History:  Procedure Laterality Date   CESAREAN SECTION N/A 04/26/2013   Procedure: CESAREAN SECTION;  Surgeon: Alwyn Pea, MD;  Location: [facility];  Service: Obstetrics;  Laterality: N/A;   CESAREAN SECTION N/A 05/24/2015   Procedure: CESAREAN SECTION;  Surgeon: Everett Graff, MD;  Location: [facility];  Service: Obstetrics;  Laterality: N/A;   NO PAST SURGERIES       OB History    Gravida  4   Para  4   Term  3   Preterm  1   AB  0   Living  3       SAB  0   TAB  0   Ectopic  0   Multiple  0   Live Births  3           Family History  Problem Relation Age of Onset   Healthy Father    Hypertension Mother    Alcohol abuse Neg Hx    Arthritis Neg Hx    Asthma Neg Hx    Birth defects Neg Hx    Cancer Neg Hx    COPD Neg Hx    Depression Neg Hx    Diabetes Neg Hx    Drug abuse Neg Hx    Early death Neg Hx    Hearing loss Neg Hx    Heart disease Neg Hx    Hyperlipidemia Neg Hx    Kidney disease Neg Hx    Learning disabilities Neg Hx    Mental illness Neg Hx    Mental retardation Neg Hx    Miscarriages / Stillbirths Neg Hx    Stroke Neg Hx    Vision loss Neg Hx     Social History   Tobacco Use   Smoking status: Never Smoker   Smokeless tobacco: Never Used  Substance Use Topics   Alcohol use: No   Drug use: No    Home Medications Prior to Admission medications   Medication Sig Start Date End Date Taking? Authorizing Provider  acetaZOLAMIDE (DIAMOX) 500 MG capsule Take 500 mg by mouth 2 (two) times daily.  05/24/19  Yes [provider]  amLODipine (NORVASC) 5 MG tablet Take 2 tablets (10 mg total) by mouth daily. 11/15/19  Yes Nicolette Bang, DO  atorvastatin (LIPITOR) 20 MG tablet Take 1 tablet (20 mg total) by mouth daily. 11/15/19  Yes Nicolette Bang, DO  diphenhydrAMINE (BENADRYL) 25 MG tablet Take 1 tablet (25 mg total) by mouth every 6 (six) hours as needed. 05/14/19  Yes Regan Lemming, MD  EPINEPHrine 0.3 mg/0.3 mL IJ SOAJ injection Inject 0.3 mLs (0.3 mg total) into the muscle as needed for anaphylaxis. 06/05/19  Yes Fulp, Cammie, MD  labetalol (NORMODYNE) 300 MG tablet Take 2 tablets (600 mg total) by mouth 2 (two) times daily. 11/15/19  Yes Nicolette Bang, DO  spironolactone (ALDACTONE) 25 MG tablet Take 1 tablet (25 mg total) by mouth daily. 08/15/18  Yes Jerline Pain, MD  methocarbamol (ROBAXIN) 500 MG tablet Take 1 tablet (500  mg total) by mouth 2 (two) times daily for 7 days. 11/29/19 12/06/19  Nuala Alpha A, PA-C  Vitamin D, Ergocalciferol, (DRISDOL) 1.25 MG (50000 UT) CAPS capsule Take 1 capsule (50,000 Units total) by mouth every 7 (seven) days. Patient not taking: Reported on 11/29/2019 06/10/19   Fulp, Ander Gaster, MD  lisinopril-hydrochlorothiazide (ZESTORETIC) 20-12.5 MG tablet Take 2 tablets by mouth daily. 05/01/19 05/14/19  Fulp, Ander Gaster, MD    Allergies    Drug class [pork-derived products]  Review of Systems   Review of Systems Ten systems are reviewed and are negative for acute change except as noted in the HPI  Physical Exam Updated Vital Signs BP (!) 150/98    Pulse 62    Temp 98.7 F (37.1 C) (Oral)    Resp 15    LMP 11/02/2019    SpO2 97%   Physical Exam Constitutional:      General: She is not in acute distress.    Appearance: Normal appearance. She is well-developed. She is not ill-appearing or diaphoretic.  HENT:     Head: Normocephalic and atraumatic.     Right Ear: External ear normal.     Left Ear: External ear normal.     Nose: Nose normal.  Eyes:     General: Vision grossly intact. Gaze aligned appropriately.     Pupils: Pupils are equal, round, and reactive to light.  Neck:     Trachea: Trachea and phonation normal. No tracheal deviation.  Cardiovascular:     Rate and Rhythm: Normal rate and regular rhythm.     Pulses: Normal pulses.  Pulmonary:     Effort: Pulmonary effort is normal. No respiratory distress.  Abdominal:     General: There is no distension.     Palpations: Abdomen is soft. There is no pulsatile mass.     Tenderness: There is abdominal tenderness in the right upper quadrant and right lower quadrant. There is no guarding or rebound.  Musculoskeletal:        General: Normal range of motion.     Cervical back: Normal range of motion.     Comments: Diffuse thoracic paraspinal muscular tenderness without overlying skin change. - No midline C/T/L spinal tenderness  to palpation, no deformity, crepitus, or step-off noted. No sign of injury to the neck or back.  Skin:    General: Skin is warm and dry.  Neurological:     Mental Status: She is alert.     GCS: GCS eye subscore is 4. GCS verbal subscore is 5. GCS motor subscore is 6.     Comments: Speech is  clear and goal oriented, follows commands Major Cranial nerves without deficit, no facial droop Moves extremities without ataxia, coordination intact  Psychiatric:        Behavior: Behavior normal.     ED Results / Procedures / Treatments   Labs (all labs ordered are listed, but only abnormal results are displayed) Labs Reviewed  CBC - Abnormal; Notable for the following components:      Result Value   Hemoglobin 10.5 (*)    HCT 34.0 (*)    MCV 78.9 (*)    MCH 24.4 (*)    RDW 16.5 (*)    Platelets 452 (*)    All other components within normal limits  BASIC METABOLIC PANEL - Abnormal; Notable for the following components:   Chloride 112 (*)    CO2 18 (*)    Glucose, Bld 123 (*)    All other components within normal limits  URINALYSIS, ROUTINE W REFLEX MICROSCOPIC - Abnormal; Notable for the following components:   APPearance HAZY (*)    Hgb urine dipstick MODERATE (*)    RBC / HPF >50 (*)    Bacteria, UA RARE (*)    All other components within normal limits  HEPATIC FUNCTION PANEL - Abnormal; Notable for the following components:   AST 14 (*)    All other components within normal limits  URINE CULTURE  LIPASE, BLOOD  I-STAT BETA HCG BLOOD, ED ([facility], [facility], [facility] ONLY)  TROPONIN I (HIGH SENSITIVITY)    EKG EKG Interpretation  Date/Time:  Wednesday Nov 29 2019 15:52:29 EDT Ventricular Rate:  61 PR Interval:  176 QRS Duration: 88 QT Interval:  430 QTC Calculation: 432 R Axis:   7 Text Interpretation: Normal sinus rhythm Cannot rule out Anterior infarct , age undetermined Abnormal ECG No significant change since 11/25/2019 Confirmed by Veryl Speak [phone]) on 11/29/2019 4:01:37  PM   Radiology DG Chest 2 View  Result Date: 11/29/2019 CLINICAL DATA:  Back and left shoulder pain, intermittent pain for 2 weeks EXAM: CHEST - 2 VIEW COMPARISON:  08/16/2018 FINDINGS: Frontal and lateral views of the chest demonstrate an unremarkable cardiac silhouette. No airspace disease, effusion, or pneumothorax. There are no acute or destructive bony lesions. IMPRESSION: 1. No acute intrathoracic process. Electronically Signed   By: Randa Ngo M.D.   On: 11/29/2019 18:20   CT Renal Stone Study  Result Date: 11/29/2019 CLINICAL DATA:  Left-sided flank pain for 2 weeks EXAM: CT ABDOMEN AND PELVIS WITHOUT CONTRAST TECHNIQUE: Multidetector CT imaging of the abdomen and pelvis was performed following the standard protocol without IV contrast. COMPARISON:  CT 05/21/2016 FINDINGS: Lower chest: No acute abnormality. Hepatobiliary: No focal liver abnormality is seen. No gallstones, gallbladder wall thickening, or biliary dilatation. Pancreas: Unremarkable. No pancreatic ductal dilatation or surrounding inflammatory changes. Spleen: Normal in size without focal abnormality. Adrenals/Urinary Tract: Adrenal glands are unremarkable. Kidneys are normal, without renal calculi, focal lesion, or hydronephrosis. Bladder is unremarkable. Stomach/Bowel: Stomach is within normal limits. Appendix appears normal. No evidence of bowel wall thickening, distention, or inflammatory changes. Vascular/Lymphatic: Mild aortic atherosclerosis. No aneurysm. No suspicious adenopathy Reproductive: Slightly bulky appearing uterine fundus with isolated calcification probably due to fibroid. No adnexal mass. Other: Negative for free air or free fluid Musculoskeletal: No acute or significant osseous findings. IMPRESSION: 1. Negative for hydronephrosis or ureteral stone 2. Probable uterine fibroids Electronically Signed   By: Donavan Foil M.D.   On: 11/29/2019 20:32   US Abdomen Limited RUQ  Result Date: 11/29/2019 CLINICAL  DATA:   Right upper quadrant pain 2 weeks. EXAM: ULTRASOUND ABDOMEN LIMITED RIGHT UPPER QUADRANT COMPARISON:  None. FINDINGS: Gallbladder: No gallstones or wall thickening visualized. No sonographic Murphy sign noted by sonographer. Common bile duct: Diameter: 2.5 mm. Liver: No focal lesion identified. Within normal limits in parenchymal echogenicity. Portal vein is patent on color Doppler imaging with normal direction of blood flow towards the liver. Other: None. IMPRESSION: Normal right upper quadrant ultrasound. Electronically Signed   By: Marin Olp M.D.   On: 11/29/2019 16:42    Procedures Procedures (including critical care time)  Medications Ordered in ED Medications  morphine 4 MG/ML injection 4 mg (4 mg Intravenous Given 11/29/19 1904)    ED Course  I have reviewed the triage vital signs and the nursing notes.  Pertinent labs & imaging results that were available during my care of the patient were reviewed by me and considered in my medical decision making (see chart for details).    MDM Rules/Calculators/A&P                      I reviewed patient's cardiology office visit from 11/24/2019, patient with hypertension well-controlled.  Chronic back pain, breast discomfort, abdominal discomfort, no evidence of shingles at that time, following with PCP. Pseudotumor cerebri doing well on Diamox.  I also reviewed urgent care visit from 11/25/2019.  Patient with abdominal tenderness and thoracic back tenderness.  They advised PCP follow-up, ice, Tylenol. --------------- CBC shows no excessive suggest infection, hemoglobin of 10.5 slightly below baseline.  Suspect mild decrease in hemoglobin is secondary to patient currently menstruating, she has no evidence of symptomatic anemia today.  BMP shows no emergent electrolyte derangement or evidence of kidney injury, gap normal.  Urinalysis shows hemoglobin, RBCs, WBCs, rare bacteria.  This may be secondary to patient's menstrual cycle today however cannot  rule out stone.  Urine culture pending.  Pregnancy test negative.  LFTs with no acute elevation suggestive of obstruction or hepatitis.  Lipase within normal limits no evidence of pancreatitis.  High-sensitivity troponin within normal limits.  Right upper quadrant ultrasound was ordered due to mild tenderness of the right upper quadrant.  RUQ [place]:    IMPRESSION:  Normal right upper quadrant ultrasound.   DG Chest:  IMPRESSION:  1. No acute intrathoracic process.  I personally reviewed patient's chest x-ray and agree with radiology interpretation.  CT Renal Stone Study:  IMPRESSION:  1. Negative for hydronephrosis or ureteral stone  2. Probable uterine fibroids   EKG: Normal sinus rhythm Cannot rule out Anterior infarct , age undetermined Abnormal ECG No significant change since 11/25/2019 Confirmed by Veryl Speak [phone]) on 11/29/2019 4:01:37 PM ---------- Suspect patient's back pain today is musculoskeletal in etiology, she has reproducible tenderness with palpation of the bilateral thoracic paraspinal muscles.  There is no overlying skin change suggestive of shingles, cellulitis or other emergent pathologies.  Additionally with reassuring work-up above doubt dissection, ACS, pneumonia, PE or other emergent cardiopulmonary etiology of her thoracic back pain today.  Will prescribe Robaxin and refer for PCP follow-up.  Additionally patient's abdominal pain is nonspecific at this point, no evidence of cholecystitis, appendicitis, kidney stone, perforation, SBO, pyelonephritis or other emergent pathologies, she is tolerating p.o. without difficulty, no significant tenderness on reexamination no negation for further work-up at this time will advise PCP follow-up.  I discussed results including incidental in full with patient's husband.  Again patient refused translator today, she told the [place] that she  did not want to use her and only wanted to use her husband as Optometrist.   Patient's husband stated understanding of care plan and relayed this to his wife, they are agreeable they have no further questions or concerns.   At this time there does not appear to be any evidence of an acute emergency medical condition and the patient appears stable for discharge with appropriate outpatient follow up. Diagnosis was discussed with patient and husband who verbalizes understanding of care plan and is agreeable to discharge. I have discussed return precautions with patient and husband who verbalizes understanding. Patient and husband encouraged to follow-up with their PCP. All questions answered.  Patient's case was reviewed with Dr. Stark Jock who agrees with discharge and PCP follow-up.  Note: Portions of this report may have been transcribed using voice recognition software. Every effort was made to ensure accuracy; however, inadvertent computerized transcription errors may still be present. Final Clinical Impression(s) / ED Diagnoses Final diagnoses:  RUQ abdominal pain  Chronic bilateral back pain, unspecified back location    Rx / DC Orders ED Discharge Orders         Ordered    methocarbamol (ROBAXIN) 500 MG tablet  2 times daily     11/29/19 2128           Gari Crown 11/29/19 2145    Veryl Speak, MD 11/30/19 1626

## 2019-11-29 NOTE — Discharge Instructions (Addendum)
You have been diagnosed today with Abdominal Pain and Back Pain.  At this time there does not appear to be the presence of an emergent medical condition, however there is always the potential for conditions to change. Please read and follow the below instructions.  Please return to the Emergency Department immediately for any new or worsening symptoms. Please be sure to follow up with your Primary Care Provider within one week regarding your visit today; please call their office to schedule an appointment even if you are feeling better for a follow-up visit. You may use the muscle relaxer Robaxin as prescribed to help with your symptoms.  Do not drive or operate heavy machinery while taking Robaxin as it will make you drowsy.  Do not drink alcohol or take other sedating medications while taking Robaxin as this will worsen side effects. Please drink plenty of water to avoid dehydration and get enough rest.  Get help right away if: You develop new bowel or bladder control problems. You have unusual weakness or numbness in your arms or legs. You develop nausea or vomiting. You develop abdominal pain. You feel faint. You cannot stop vomiting. Your pain is only in areas of your belly, such as the right side or the left lower part of the belly. You have bloody or black poop, or poop that looks like tar. You have very bad pain, cramping, or bloating in your belly. You have signs of not having enough fluid or water in your body (dehydration), such as: Dark pee, very little pee, or no pee. Cracked lips. Dry mouth. Sunken eyes. Sleepiness. Weakness. You have trouble breathing or chest pain. You have any new/concerning or worsening of symptoms  Please read the additional information packets attached to your discharge summary.  Do not take your medicine if  develop an itchy rash, swelling in your mouth or lips, or difficulty breathing; call 911 and seek immediate emergency medical attention if  this occurs.  Note: Portions of this text may have been transcribed using voice recognition software. Every effort was made to ensure accuracy; however, inadvertent computerized transcription errors may still be present. ------------------------------ Below has been translated using Google translate.  Errors may be present.  Interpret with caution.  Ci-dessous a t traduit  [employer] translate. Des erreurs peuvent tre prsentes. Interprtez avec prudence. ------------------------------ On vous a diagnostiqu aujourd'hui des douleurs abdominales et des maux de dos.   l'heure actuelle, il ne semble pas y avoir de condition mdicale mergente, mais il y a toujours la possibilit que les conditions changent. Veuillez lire et suivre les instructions ci-dessous.  1. Althea Grimmer retourner [employer] service des urgences pour tout nouveau symptme ou aggravation de vos symptmes. 2. Althea Grimmer vous assurer de faire un suivi auprs de votre fournisseur de soins primaires dans un dlai d'une semaine concernant votre visite aujourd'hui; Arts development officer pour prendre rendez-vous mme si vous vous sentez mieux pour [employer] visite de suivi. 3. Vous pouvez utiliser le relaxant musculaire Robaxin tel que prescrit pour soulager vos symptmes. Ne conduisez pas et n'utilisez pas de machinerie lourde pendant que vous prenez Robaxin car cela vous rendra somnolent. Ne buvez pas d'alcool ou ne prenez pas d'autres mdicaments sdatifs pendant que vous prenez Robaxin, car cela aggravera les effets secondaires. 4. Veuillez boire beaucoup d'eau pour viter la dshydratation et vous reposer suffisamment.  Fae Pippin de l'aide immdiatement si: ? Vous dveloppez de nouveaux problmes de contrle des intestins ou de la vessie. ? Vous avez une faiblesse  inhabituelle ou un engourdissement dans les bras ou les jambes. ? Vous dveloppez des [employer]. ? Vous dveloppez des [employer]. ? Vous vous sentez faible. 1. Vous ne pouvez pas arrter de vomir. 2. Votre douleur est seulement dans les zones de votre ventre, comme le ct droit ou la partie infrieure gauche du ventre. 3. Vous avez du Advertising copywriter, ou du caca qui ressemble  du goudron. 4. Vous avez de trs fortes douleurs, des crampes ou des [employer] ventre. 5. Vous prsentez des signes de manque de liquide ou d'eau dans votre corps (dshydratation), tels que: o Pipi fonc, trs peu de pipi ou pas de pipi. o Lvres gerces. o Bouche sche. o Yeux enfoncs. o Somnolence. o Faiblesse. 6. Vous avez des difficults  respirer ou des [employer]. 7. Vous avez des symptmes nouveaux / inquitants ou aggravs  Veuillez lire les paquets d'informations supplmentaires joints  votre rsum de sortie.  Ne prenez pas votre mdicament si vous dveloppez une ruption Manufacturing systems engineer, un gonflement de la bouche ou des lvres ou des difficults respiratoires; Building surveyor 911 et [employer] immdiatement un mdecin d'urgence si cela se produit.  Remarque: des parties de ce texte Journalist, newspaper t transcrites  l'aide d'un logiciel de Scientist, product/process development. Tous les efforts ont t faits pour assurer l'exactitude; cependant, des erreurs de transcription informatises par [employer] tre prsentes.

## 2019-11-29 NOTE — ED Triage Notes (Signed)
Patient c/o lower back pain and left shoulder pain for a while now. States pain is intermittent but worse over the past 2 weeks. Went to UC for same complaint has X-rays and was told to follow up with PCP. Unable to get appt until end of June. Denies any urinary symptoms. Minor relief with bengay cream.

## 2019-12-01 ENCOUNTER — Other Ambulatory Visit: Payer: Self-pay

## 2019-12-01 ENCOUNTER — Ambulatory Visit
Admission: RE | Admit: 2019-12-01 | Discharge: 2019-12-01 | Disposition: A | Discharge status: Home / Self Care | Payer: BC Managed Care – PPO | Source: Ambulatory Visit | Attending: Family Medicine | Admitting: Family Medicine

## 2019-12-01 DIAGNOSIS — Z1231 Encounter for screening mammogram for malignant neoplasm of breast: Secondary | ICD-10-CM

## 2019-12-10 ENCOUNTER — Other Ambulatory Visit: Payer: Self-pay | Admitting: Family Medicine

## 2019-12-10 ENCOUNTER — Other Ambulatory Visit: Payer: Self-pay | Admitting: Cardiology

## 2019-12-10 DIAGNOSIS — I10 Essential (primary) hypertension: Secondary | ICD-10-CM

## 2019-12-10 DIAGNOSIS — M62838 Other muscle spasm: Secondary | ICD-10-CM

## 2019-12-10 DIAGNOSIS — G932 Benign intracranial hypertension: Secondary | ICD-10-CM

## 2019-12-28 ENCOUNTER — Ambulatory Visit (INDEPENDENT_AMBULATORY_CARE_PROVIDER_SITE_OTHER): Payer: BC Managed Care – PPO | Admitting: Family Medicine

## 2019-12-28 ENCOUNTER — Other Ambulatory Visit: Payer: Self-pay

## 2019-12-28 ENCOUNTER — Encounter: Payer: Self-pay | Admitting: Family Medicine

## 2019-12-28 VITALS — BP 138/86 | HR 76 | Wt 147.0 lb

## 2019-12-28 DIAGNOSIS — R7303 Prediabetes: Secondary | ICD-10-CM | POA: Diagnosis not present

## 2019-12-28 DIAGNOSIS — Z131 Encounter for screening for diabetes mellitus: Secondary | ICD-10-CM | POA: Diagnosis not present

## 2019-12-28 DIAGNOSIS — I10 Essential (primary) hypertension: Secondary | ICD-10-CM | POA: Diagnosis not present

## 2019-12-28 LAB — POCT GLYCOSYLATED HEMOGLOBIN (HGB A1C): Hemoglobin A1C: 5.2 % (ref 4.0–5.6)

## 2019-12-28 NOTE — Patient Instructions (Signed)
Lovely to see you today. Your blood pressure looks good. Please bring your medications next time please bring your medications with you so we can go through which medications you are taking. Next visit we will do a pap smear.  Best wishes,  Dr Lattie Haw

## 2019-12-28 NOTE — Progress Notes (Signed)
    SUBJECTIVE:   CHIEF COMPLAINT / HPI:   Megan Baxter is a 49 year old female who presents today for new patient visit.  Accompanied by her husband.  Husband translated in [place]  HPI: No current concerns  PMH: Hypertension, pseudo tumor cerebri, hyperlipidemia, prediabetes  PSH: 2 C-sections  DH: NKDA, no pork-containing products, labetalol, atorvastatin and acetazolamide.  Patient has number of other hypertensive medications medication such as amlodipine lisinopril HCTZ and spironolactone however is unsure which medication she is taking at home. SH: Lives with husband and 4 children.  Works at [employer].  Denies smoking or drinking EtOH.  Health maintenance: Last Pap was in 2018 and normal.  Is due for next Pap.  PERTINENT  PMH / PSH: Pseudotumor cerebri, hyperlipidemia, hypertension, prediabetes  OBJECTIVE:   BP 138/86   Pulse 76   Wt 147 lb (66.7 kg)   LMP 11/26/2019 (Exact Date)   SpO2 100%   BMI 23.02 kg/m   General: Alert and cooperative and appears to be in no acute distressy Cardio: Normal S1 and S2, RRR Pulm: CTAB, normal work of breathing Abdomen: Bowel sounds normal. Abdomen soft and non-tender.  Extremities: No peripheral edema. Warm/ well perfused.  Strong radial pulse. Neuro: Cranial nerves grossly intact  ASSESSMENT/PLAN:   Essential hypertension BP at goal today.  Patient reports taking labetalol, however her medication list includes amlodipine, lisinopril-HCTZ and spironolactone.  Patient is unsure whether she takes the latter 3 medications.  Recommended patient come for blood pressure follow-up and brings all of her medications with her.  -Bp follow-up in 1-2 weeks.  Screening for diabetes mellitus A1c previously 6, 6 months ago. A1c today 5.2 and at goal. Continue to monitor A1c.    Lattie Haw, MD [place]

## 2019-12-31 ENCOUNTER — Other Ambulatory Visit: Payer: Self-pay | Admitting: Family Medicine

## 2019-12-31 DIAGNOSIS — G932 Benign intracranial hypertension: Secondary | ICD-10-CM

## 2019-12-31 DIAGNOSIS — Z131 Encounter for screening for diabetes mellitus: Secondary | ICD-10-CM | POA: Insufficient documentation

## 2019-12-31 DIAGNOSIS — I10 Essential (primary) hypertension: Secondary | ICD-10-CM

## 2019-12-31 NOTE — Assessment & Plan Note (Signed)
A1c previously 6, 6 months ago. A1c today 5.2 and at goal. Continue to monitor A1c.

## 2019-12-31 NOTE — Assessment & Plan Note (Signed)
BP at goal today.  Patient reports taking labetalol, however her medication list includes amlodipine, lisinopril-HCTZ and spironolactone.  Patient is unsure whether she takes the latter 3 medications.  Recommended patient come for blood pressure follow-up and brings all of her medications with her.  -Bp follow-up in 1-2 weeks.

## 2020-01-01 ENCOUNTER — Encounter: Payer: Self-pay | Admitting: Family Medicine

## 2020-01-02 NOTE — Telephone Encounter (Signed)
I believe this patient is followed now by [facility].

## 2020-03-01 ENCOUNTER — Ambulatory Visit (INDEPENDENT_AMBULATORY_CARE_PROVIDER_SITE_OTHER): Payer: BC Managed Care – PPO | Admitting: Family Medicine

## 2020-03-01 ENCOUNTER — Encounter: Payer: Self-pay | Admitting: Family Medicine

## 2020-03-01 ENCOUNTER — Other Ambulatory Visit: Payer: Self-pay

## 2020-03-01 VITALS — BP 124/77 | HR 64 | Ht 67.0 in | Wt 149.0 lb

## 2020-03-01 DIAGNOSIS — G8929 Other chronic pain: Secondary | ICD-10-CM | POA: Diagnosis not present

## 2020-03-01 DIAGNOSIS — I10 Essential (primary) hypertension: Secondary | ICD-10-CM | POA: Diagnosis not present

## 2020-03-01 DIAGNOSIS — G932 Benign intracranial hypertension: Secondary | ICD-10-CM | POA: Diagnosis not present

## 2020-03-01 DIAGNOSIS — M546 Pain in thoracic spine: Secondary | ICD-10-CM | POA: Diagnosis not present

## 2020-03-01 MED ORDER — DICLOFENAC SODIUM 1 % EX GEL: 4.0000 g | Freq: Four times a day (QID) | CUTANEOUS | Status: DC

## 2020-03-01 MED ORDER — DICLOFENAC SODIUM 1 % EX GEL: 4.0000 g | Freq: Four times a day (QID) | CUTANEOUS | 0 refills | Status: DC

## 2020-03-01 MED ORDER — ACETAMINOPHEN 500 MG PO TABS
1000.0000 mg | ORAL_TABLET | Freq: Four times a day (QID) | ORAL | 0 refills | Status: DC
Start: 2020-03-01 — End: 2022-01-22

## 2020-03-01 NOTE — Patient Instructions (Addendum)
Great to see you today! I think the backpain is related to muscle injury. Please try the stretches below and also try tylenol 1000mg  4 times a day and apply the gel to the back. You can also try heat pad/ice pack. I dont think this is anything serious. Follow up with me if it is still painful. We can refer to physical therapy.  Stop taking spironolactone. Please follow up with me in 1-2 weeks for the blood pressure. We will consider reducing blood pressure medicine.  Best wishes,  Dr Posey Pronto    Back Exercises The following exercises strengthen the muscles that help to support the trunk and back. They also help to keep the lower back flexible. Doing these exercises can help to prevent back pain or lessen existing pain.  If you have back pain or discomfort, try doing these exercises 2-3 times each day or as told by your health care provider.  As your pain improves, do them once each day, but increase the number of times that you repeat the steps for each exercise (do more repetitions).  To prevent the recurrence of back pain, continue to do these exercises once each day or as told by your health care provider. Do exercises exactly as told by your health care provider and adjust them as directed. It is normal to feel mild stretching, pulling, tightness, or discomfort as you do these exercises, but you should stop right away if you feel sudden pain or your pain gets worse. Exercises Single knee to chest Repeat these steps 3-5 times for each leg: 1. Lie on your back on a firm bed or the floor with your legs extended. 2. Bring one knee to your chest. Your other leg should stay extended and in contact with the floor. 3. Hold your knee in place by grabbing your knee or thigh with both hands and hold. 4. Pull on your knee until you feel a gentle stretch in your lower back or buttocks. 5. Hold the stretch for 10-30 seconds. 6. Slowly release and straighten your leg. Pelvic tilt Repeat these steps  5-10 times: 1. Lie on your back on a firm bed or the floor with your legs extended. 2. Bend your knees so they are pointing toward the ceiling and your feet are flat on the floor. 3. Tighten your lower abdominal muscles to press your lower back against the floor. This motion will tilt your pelvis so your tailbone points up toward the ceiling instead of pointing to your feet or the floor. 4. With gentle tension and even breathing, hold this position for 5-10 seconds. Cat-cow Repeat these steps until your lower back becomes more flexible: 1. Get into a hands-and-knees position on a firm surface. Keep your hands under your shoulders, and keep your knees under your hips. You may place padding under your knees for comfort. 2. Let your head hang down toward your chest. Contract your abdominal muscles and point your tailbone toward the floor so your lower back becomes rounded like the back of a cat. 3. Hold this position for 5 seconds. 4. Slowly lift your head, let your abdominal muscles relax and point your tailbone up toward the ceiling so your back forms a sagging arch like the back of a cow. 5. Hold this position for 5 seconds.  Press-ups Repeat these steps 5-10 times: 1. Lie on your abdomen (face-down) on the floor. 2. Place your palms near your head, about shoulder-width apart. 3. Keeping your back as relaxed as possible  and keeping your hips on the floor, slowly straighten your arms to raise the top half of your body and lift your shoulders. Do not use your back muscles to raise your upper torso. You may adjust the placement of your hands to make yourself more comfortable. 4. Hold this position for 5 seconds while you keep your back relaxed. 5. Slowly return to lying flat on the floor.  Bridges Repeat these steps 10 times: 1. Lie on your back on a firm surface. 2. Bend your knees so they are pointing toward the ceiling and your feet are flat on the floor. Your arms should be flat at your  sides, next to your body. 3. Tighten your buttocks muscles and lift your buttocks off the floor until your waist is at almost the same height as your knees. You should feel the muscles working in your buttocks and the back of your thighs. If you do not feel these muscles, slide your feet 1-2 inches farther away from your buttocks. 4. Hold this position for 3-5 seconds. 5. Slowly lower your hips to the starting position, and allow your buttocks muscles to relax completely. If this exercise is too easy, try doing it with your arms crossed over your chest. Abdominal crunches Repeat these steps 5-10 times: 1. Lie on your back on a firm bed or the floor with your legs extended. 2. Bend your knees so they are pointing toward the ceiling and your feet are flat on the floor. 3. Cross your arms over your chest. 4. Tip your chin slightly toward your chest without bending your neck. 5. Tighten your abdominal muscles and slowly raise your trunk (torso) high enough to lift your shoulder blades a tiny bit off the floor. Avoid raising your torso higher than that because it can put too much stress on your low back and does not help to strengthen your abdominal muscles. 6. Slowly return to your starting position. Back lifts Repeat these steps 5-10 times: 1. Lie on your abdomen (face-down) with your arms at your sides, and rest your forehead on the floor. 2. Tighten the muscles in your legs and your buttocks. 3. Slowly lift your chest off the floor while you keep your hips pressed to the floor. Keep the back of your head in line with the curve in your back. Your eyes should be looking at the floor. 4. Hold this position for 3-5 seconds. 5. Slowly return to your starting position. Contact a health care provider if:  Your back pain or discomfort gets much worse when you do an exercise.  Your worsening back pain or discomfort does not lessen within 2 hours after you exercise. If you have any of these problems,  stop doing these exercises right away. Do not do them again unless your health care provider says that you can. Get help right away if:  You develop sudden, severe back pain. If this happens, stop doing the exercises right away. Do not do them again unless your health care provider says that you can. This information is not intended to replace advice given to you by your health care provider. Make sure you discuss any questions you have with your health care provider. Document Revised: 11/17/2018 Document Reviewed: 04/14/2018 Elsevier Patient Education  [employer].

## 2020-03-01 NOTE — Progress Notes (Signed)
    SUBJECTIVE:   CHIEF COMPLAINT / HPI:    Megan Baxter is a 49 yr old female who presents today for back pain. Husband acted as Veterinary surgeon.  Back pain began 5 years ago, left sided thoracic back pain near left scapula. Unable to describe character of pain Patient has tried OTC gel called Bengay. Pain radiates to right anterior chest wall History of trauma or injury: no Patient believes might be causing their pain: unsure  Prior history of similar pain: yes, saw multiples back at home who thought it was not serious but patient is concerned, History of cancer: no  Weak immune system:  No  History of IV drug use: no  History of steroid use: no   Symptoms Incontinence of bowel or bladder:  No  Numbness of leg: possible Fever: no  Rest or Night pain: no Weight Loss: no Rash:  Recently has had a pruritic rash   HTN/pseudotumor cerebri  Pt brought all her anti-hypertensives with her. She says she takes spironolactone, acetazolamide and labetalol. Unsure when she was given spironolactone to take. Previous PCP rx labetalol. Acetazolamide is prescribed by neurologist. Saw neurologist 2 days who said BP was at goal and to continue acetazolamide.  PERTINENT  PMH / PSH: Pseudotumor cerebri  OBJECTIVE:   BP 124/77   Pulse 64   Ht 5\' 7"  (1.702 m)   Wt 149 lb (67.6 kg)   LMP 02/14/2020   SpO2 98%   BMI 23.34 kg/m    General: Alert, cooperative, no acute distress Back: no spinal tenderness, tenderness on palpation of thoracic, left paraspinal muscles. Full ROM at both shoulder joints, no obvious deformities.  Extremities: 5/5 strength upper and lower reflexes. DTR 2+, normal sensation throughout extremities. Neuro: Cranial nerves grossly intact Gait: normal  ASSESSMENT/PLAN:   Pseudotumor cerebri Continue Acetazolamide as per neurology.  Essential hypertension BP at goal. Stopped spironolactone today. Will aim to wean patient off Labetalol from next visit in 1-2 weeks if  she remains normontensive.  Chronic left-sided thoracic back pain No obvious deformity on exam today on examination of spine or shoulder joint. No red flags of back pain. Likely muscular in origin. Recommend 1g tylenol QID, voltaren gel to apply to affected area. Also recommended heat/ice therapy to area,. Provided back pain stretches. Follow up with me in 1-2 weeks. Would consider physical therapy if back pain not alleviated with conservative management. Pt and husband happy with plan.     Megan Haw, MD [place]

## 2020-03-03 DIAGNOSIS — M546 Pain in thoracic spine: Secondary | ICD-10-CM | POA: Insufficient documentation

## 2020-03-03 DIAGNOSIS — G8929 Other chronic pain: Secondary | ICD-10-CM | POA: Insufficient documentation

## 2020-03-03 NOTE — Assessment & Plan Note (Signed)
No obvious deformity on exam today on examination of spine or shoulder joint. No red flags of back pain. Likely muscular in origin. Recommend 1g tylenol QID, voltaren gel to apply to affected area. Also recommended heat/ice therapy to area,. Provided back pain stretches. Follow up with me in 1-2 weeks. Would consider physical therapy if back pain not alleviated with conservative management. Pt and husband happy with plan.

## 2020-03-03 NOTE — Assessment & Plan Note (Signed)
Continue Acetazolamide as per neurology.

## 2020-03-03 NOTE — Assessment & Plan Note (Signed)
BP at goal. Stopped spironolactone today. Will aim to wean patient off Labetalol from next visit in 1-2 weeks if she remains normontensive.

## 2020-03-22 ENCOUNTER — Other Ambulatory Visit: Payer: Self-pay

## 2020-03-22 ENCOUNTER — Encounter: Payer: Self-pay | Admitting: Family Medicine

## 2020-03-22 ENCOUNTER — Ambulatory Visit (INDEPENDENT_AMBULATORY_CARE_PROVIDER_SITE_OTHER): Payer: BC Managed Care – PPO | Admitting: Family Medicine

## 2020-03-22 VITALS — BP 144/86 | HR 71 | Wt 146.6 lb

## 2020-03-22 DIAGNOSIS — G8929 Other chronic pain: Secondary | ICD-10-CM | POA: Diagnosis not present

## 2020-03-22 DIAGNOSIS — M546 Pain in thoracic spine: Secondary | ICD-10-CM | POA: Diagnosis not present

## 2020-03-22 MED ORDER — IBUPROFEN 200 MG PO TABS: 400.0000 mg | ORAL_TABLET | Freq: Three times a day (TID) | ORAL | Status: DC

## 2020-03-22 MED ORDER — IBUPROFEN 400 MG PO TABS
400.0000 mg | ORAL_TABLET | Freq: Three times a day (TID) | ORAL | 0 refills | Status: AC | PRN
Start: 2020-03-22 — End: 2020-04-21

## 2020-03-22 NOTE — Patient Instructions (Signed)
Great to see you today!! I am glad to hear that your back pain has got better. I have prescribed motrin which you can take up to 3 times a day. I have referred you to sports medicine to see if they have any other suggestions. Please continue to use the diclofenac gel too.  Best wishes,  Dr Posey Pronto

## 2020-03-22 NOTE — Assessment & Plan Note (Signed)
Unclear etiology however most likely musculoskeletal in origin. Normal shoulder exam and low suspicion for shoulder pathology. Patient's pain has been chronic for many years and she has seen multiple doctors for it before with no clear diagnosis.  Patient has noticed some improvement in pain with Tylenol and Voltaren gel.  Recommended to continue this along with Motrin 400mg  every 8 as needed and referedr to sports medicine for second opinion.

## 2020-03-22 NOTE — Progress Notes (Signed)
    SUBJECTIVE:   CHIEF COMPLAINT / HPI:   Megan Baxter is a 49 year old female presents today for back pain follow-up.  Husband also present for patient.  Right scapula pain Since last visit patient has tried Tylenol and taking Voltaren gel for the left-sided scapula pain.  This has helped a little but after the medication wears off the pain starts to recur.  She wants imaging of the area to see why she still has the pain.   PERTINENT  PMH / PSH: Pseudotumor cerebri  OBJECTIVE:   BP (!) 144/86   Pulse 71   Wt 146 lb 9.6 oz (66.5 kg)   SpO2 99%   BMI 22.96 kg/m    General: Alert and cooperative and appears to be in no acute distress Cardio: well perfused  Pulm: Normal respiratory effort Extremities: No peripheral edema.  Neuro: Cranial nerves grossly intact MSK: Normal gait, tenderness on palpation of left lateral border of scapula.  No obvious deformities at glenohumeral joint. NMormal range of motion at the left glenohumeral joint.  No pain on palpation of glenohumeral joint.  5/5 strength upper extremities and normal sensation. DTR present and normal.  ASSESSMENT/PLAN:   Chronic left-sided thoracic back pain  Unclear etiology however most likely musculoskeletal in origin. Normal shoulder exam and low suspicion for shoulder pathology. Patient's pain has been chronic for many years and she has seen multiple doctors for it before with no clear diagnosis.  Patient has noticed some improvement in pain with Tylenol and Voltaren gel.  Recommended to continue this along with Motrin 400mg  every 8 as needed and referedr to sports medicine for second opinion.     Lattie Haw, MD [place]

## 2020-04-15 ENCOUNTER — Other Ambulatory Visit: Payer: Self-pay

## 2020-04-15 ENCOUNTER — Ambulatory Visit (INDEPENDENT_AMBULATORY_CARE_PROVIDER_SITE_OTHER): Payer: BC Managed Care – PPO | Admitting: Family Medicine

## 2020-04-15 ENCOUNTER — Encounter: Payer: Self-pay | Admitting: Family Medicine

## 2020-04-15 VITALS — BP 132/80 | HR 73 | Wt 146.8 lb

## 2020-04-15 DIAGNOSIS — Z124 Encounter for screening for malignant neoplasm of cervix: Secondary | ICD-10-CM | POA: Diagnosis not present

## 2020-04-15 DIAGNOSIS — M546 Pain in thoracic spine: Secondary | ICD-10-CM

## 2020-04-15 DIAGNOSIS — G8929 Other chronic pain: Secondary | ICD-10-CM | POA: Diagnosis not present

## 2020-04-15 NOTE — Patient Instructions (Addendum)
Great to see you today! Today we did a pap smear. If the results are normal I will send you a letter. If they are abnormal I will contact you.  I will place another referral to sports medicine for you.   I look forward to seeing you again soon!   Best wishes, Dr Posey Pronto

## 2020-04-15 NOTE — Progress Notes (Signed)
    SUBJECTIVE:   CHIEF COMPLAINT / HPI:   Megan Baxter is a 49 year old female who presents today for a pap smear. Patient was present with her husband who interpreted for her.   Pap smear Last Pap was in 2018 which was negative for intraepithelial lesions or malignancy. LMP 19 days ago. Cycles are regular. Patient has 4 children and no longer wishes to have anymore. Is not on birth control.  PERTINENT  PMH / PSH: back pain, HTN, dyslipidemia   OBJECTIVE:   BP 132/80   Pulse 73   Wt 146 lb 12.8 oz (66.6 kg)   SpO2 98%   BMI 22.99 kg/m    General: Alert, no acute distress, well appearing female  Cardio: warm and well perfused  Pulm: CTAB, normal respiratory effort Abdomen: Bowel sounds normal. Abdomen soft and non-tender.  Extremities: No peripheral edema. Neuro: Cranial nerves grossly intact  Pelvic exam chaperoned by CMA Jazmin. Normal cervix visualized. Pap smear obtained. Normal pelvic exam.   ASSESSMENT/PLAN:   Encounter for Papanicolaou smear of cervix Pap smear obtained today. Will inform patient of results.    Lattie Haw, MD PGY-2 [place]

## 2020-04-16 ENCOUNTER — Telehealth: Payer: Self-pay

## 2020-04-16 ENCOUNTER — Other Ambulatory Visit (HOSPITAL_COMMUNITY)
Admission: RE | Admit: 2020-04-16 | Discharge: 2020-04-16 | Disposition: A | Discharge status: Home / Self Care | Payer: BC Managed Care – PPO | Source: Ambulatory Visit | Attending: Family Medicine | Admitting: Family Medicine

## 2020-04-16 DIAGNOSIS — Z124 Encounter for screening for malignant neoplasm of cervix: Secondary | ICD-10-CM | POA: Diagnosis not present

## 2020-04-16 NOTE — Telephone Encounter (Signed)
Called and gave husband phone number. He will call and schedule an appt. Ottis Stain, CMA

## 2020-04-16 NOTE — Telephone Encounter (Signed)
-----   Message from Lattie Haw, MD sent at 04/16/2020 11:07 AM EDT ----- Regarding: sports medicine referral Hi team,  Can you please call the the patient's husband and give them the number for sports medicine [phone] clinic. They tried to contact patient but was unable to reach her.  Thank you! Poonam

## 2020-04-17 DIAGNOSIS — Z124 Encounter for screening for malignant neoplasm of cervix: Secondary | ICD-10-CM | POA: Insufficient documentation

## 2020-04-17 LAB — CYTOLOGY - PAP
Comment: NEGATIVE
Diagnosis: NEGATIVE
High risk HPV: NEGATIVE

## 2020-04-17 NOTE — Assessment & Plan Note (Signed)
Pap smear obtained today. Will inform patient of results.

## 2020-04-21 ENCOUNTER — Encounter: Payer: Self-pay | Admitting: Family Medicine

## 2020-04-22 ENCOUNTER — Encounter: Payer: Self-pay | Admitting: Family Medicine

## 2020-04-22 ENCOUNTER — Ambulatory Visit (INDEPENDENT_AMBULATORY_CARE_PROVIDER_SITE_OTHER): Payer: BC Managed Care – PPO | Admitting: Family Medicine

## 2020-04-22 VITALS — BP 164/104 | Ht 65.0 in | Wt 146.0 lb

## 2020-04-22 DIAGNOSIS — G8929 Other chronic pain: Secondary | ICD-10-CM | POA: Diagnosis not present

## 2020-04-22 DIAGNOSIS — M546 Pain in thoracic spine: Secondary | ICD-10-CM

## 2020-04-22 NOTE — Patient Instructions (Signed)
You have a chronic left thoracic strain. You can continue to use the diclofenac gel, tylenol if needed. However, medications will not fix this issue. Physical therapy has the best chance to cure this but you have to be patient with it. Do home exercises and stretches on days you don't go to therapy. If not improving over 6 weeks would consider imaging of your lower cervical spine including MRI. Follow up with me in 6 weeks.

## 2020-04-22 NOTE — Progress Notes (Signed)
PCP and consultation requested by: Lattie Haw, MD  Subjective:   HPI: Patient is a 49 y.o. female here for chronic back pain.  Patient is here with husband who served as interpreter - declined interpreter services. They report she has had several years of intermittent left sided mid-back pain. Pain can be rather severe and sharp but always starts around the shoulder blade area. Radiation from here into left side of chest anteriorly. She had a cardiac workup for this issue which was normal. Radiographs and MRI were done in 2015-6 of thoracic spine - small right disc protrusion at T8-9 and small left protrusion at T9-10 felt to be noncontributory and lower than level of her current pain. She denies having done physical therapy in the past but has been given home exercises in the past though not currently doing. Radiates into left armpit but not into extremities. No pain on right side. Ben-gay and voltaren gel have helped some. Notes indicate meloxicam has helped in past as well. No bowel/bladder dysfunction.  Past Medical History:  Diagnosis Date  . Heartburn in pregnancy   . Hypertension   . Maternal anemia in pregnancy, antepartum 02/18/2015    Current Outpatient Medications on File Prior to Visit  Medication Sig Dispense Refill  . acetaminophen (TYLENOL) 500 MG tablet Take 2 tablets (1,000 mg total) by mouth every 6 (six) hours. 30 tablet 0  . acetaZOLAMIDE (DIAMOX) 500 MG capsule Take 500 mg by mouth 2 (two) times daily.     Marland Kitchen atorvastatin (LIPITOR) 20 MG tablet Take 1 tablet (20 mg total) by mouth daily. 30 tablet 0  . diclofenac Sodium (VOLTAREN) 1 % GEL Apply 4 g topically 4 (four) times daily. 150 g 0  . diphenhydrAMINE (BENADRYL) 25 MG tablet Take 1 tablet (25 mg total) by mouth every 6 (six) hours as needed. 30 tablet 0  . EPINEPHrine 0.3 mg/0.3 mL IJ SOAJ injection Inject 0.3 mLs (0.3 mg total) into the muscle as needed for anaphylaxis. 1 each prn  . labetalol  (NORMODYNE) 300 MG tablet Take 2 tablets (600 mg total) by mouth 2 (two) times daily. 120 tablet 0  . Vitamin D, Ergocalciferol, (DRISDOL) 1.25 MG (50000 UT) CAPS capsule Take 1 capsule (50,000 Units total) by mouth every 7 (seven) days. (Patient not taking: Reported on 11/29/2019) 5 capsule 4  . [DISCONTINUED] lisinopril-hydrochlorothiazide (ZESTORETIC) 20-12.5 MG tablet Take 2 tablets by mouth daily. 60 tablet 0   No current facility-administered medications on file prior to visit.    Past Surgical History:  Procedure Laterality Date  . CESAREAN SECTION N/A 04/26/2013   Procedure: CESAREAN SECTION;  Surgeon: Alwyn Pea, MD;  Location: [facility];  Service: Obstetrics;  Laterality: N/A;  . CESAREAN SECTION N/A 05/24/2015   Procedure: CESAREAN SECTION;  Surgeon: Everett Graff, MD;  Location: [facility];  Service: Obstetrics;  Laterality: N/A;  . NO PAST SURGERIES      Allergies  Allergen Reactions  . Drug Class [Pork-Derived Products]     pork products    Social History   Socioeconomic History  . Marital status: Married    Spouse name: Not on file  . Number of children: Not on file  . Years of education: Not on file  . Highest education level: Not on file  Occupational History  . Not on file  Tobacco Use  . Smoking status: Never Smoker  . Smokeless tobacco: Never Used  Vaping Use  . Vaping Use: Never used  Substance and Sexual Activity  .  Alcohol use: No  . Drug use: No  . Sexual activity: Not on file  Other Topics Concern  . Not on file  Social History Narrative  . Not on file   Social Determinants of Health   Financial Resource Strain:   . Difficulty of Paying Living Expenses: Not on file  Food Insecurity:   . Worried About Charity fundraiser in the Last Year: Not on file  . Ran Out of Food in the Last Year: Not on file  Transportation Needs:   . Lack of Transportation (Medical): Not on file  . Lack of Transportation (Non-Medical): Not on file  Physical Activity:    . Days of Exercise per Week: Not on file  . Minutes of Exercise per Session: Not on file  Stress:   . Feeling of Stress : Not on file  Social Connections:   . Frequency of Communication with Friends and Family: Not on file  . Frequency of Social Gatherings with Friends and Family: Not on file  . Attends Religious Services: Not on file  . Active Member of Clubs or Organizations: Not on file  . Attends Archivist Meetings: Not on file  . Marital Status: Not on file  Intimate Partner Violence:   . Fear of Current or Ex-Partner: Not on file  . Emotionally Abused: Not on file  . Physically Abused: Not on file  . Sexually Abused: Not on file    Family History  Problem Relation Age of Onset  . Healthy Father   . Hypertension Mother   . Alcohol abuse Neg Hx   . Arthritis Neg Hx   . Asthma Neg Hx   . Birth defects Neg Hx   . Cancer Neg Hx   . COPD Neg Hx   . Depression Neg Hx   . Diabetes Neg Hx   . Drug abuse Neg Hx   . Early death Neg Hx   . Hearing loss Neg Hx   . Heart disease Neg Hx   . Hyperlipidemia Neg Hx   . Kidney disease Neg Hx   . Learning disabilities Neg Hx   . Mental illness Neg Hx   . Mental retardation Neg Hx   . Miscarriages / Stillbirths Neg Hx   . Stroke Neg Hx   . Vision loss Neg Hx     BP (!) 164/104   Ht 5\' 5"  (1.651 m)   Wt 146 lb (66.2 kg)   BMI 24.30 kg/m   No flowsheet data found.  No flowsheet data found.  Review of Systems: See HPI above.     Objective:  Physical Exam:  Gen: NAD, comfortable in exam room  Neck/upper back: No gross deformity, swelling, bruising. TTP left paraspinal thoracic region primarily medial to medial scapular border.  No midline/bony TTP. FROM neck with mild pain on flexion. BUE strength 5/5. Sensation intact to light touch.   2+ equal reflexes in triceps, biceps, brachioradialis tendons. Negative spurlings. NV intact distal BUEs.   Assessment & Plan:  1. Left thoracic back pain - chronic.   She has not yet done physical therapy for this - will start and do home exercises as well.  Discussed possibility of lower cervical radiculopathy being responsible for this given innervation of these muscles from cervical nerve roots.  Thoracic imaging in past has not shown cause for her pain and feel repeating this is unlikely to be of benefit.  She's tried meloxicam, voltaren gel with transient benefit.  Cardiac workup  negative.  F/u in 6 weeks.  Will go ahead with cervical spine imaging if not improving.

## 2020-05-14 ENCOUNTER — Encounter: Payer: Self-pay | Admitting: Physical Therapy

## 2020-05-14 ENCOUNTER — Ambulatory Visit: Payer: BC Managed Care – PPO | Attending: Family Medicine | Admitting: Physical Therapy

## 2020-05-14 ENCOUNTER — Other Ambulatory Visit: Payer: Self-pay

## 2020-05-14 DIAGNOSIS — M546 Pain in thoracic spine: Secondary | ICD-10-CM | POA: Insufficient documentation

## 2020-05-14 DIAGNOSIS — R252 Cramp and spasm: Secondary | ICD-10-CM | POA: Insufficient documentation

## 2020-05-14 DIAGNOSIS — R293 Abnormal posture: Secondary | ICD-10-CM | POA: Diagnosis present

## 2020-05-14 DIAGNOSIS — G8929 Other chronic pain: Secondary | ICD-10-CM | POA: Insufficient documentation

## 2020-05-14 NOTE — Therapy (Signed)
[place], [place], [address] Phone: [phone]   Fax:  [phone]  Physical Therapy Evaluation  Patient Details  Name: Megan Baxter MRN: [id number] Date of Birth: 08-Nov-1970 Referring Provider (PT): Karlton Lemon MD   Encounter Date: 05/14/2020   PT End of Session - 05/14/20 1019    Visit Number 1    Number of Visits 12    Date for PT Re-Evaluation 06/25/20    Authorization Type BCBS    PT Start Time 1018    PT Stop Time 1100    PT Time Calculation (min) 42 min    Activity Tolerance Patient limited by pain    Behavior During Therapy [facility] for tasks assessed/performed           Past Medical History:  Diagnosis Date  . Heartburn in pregnancy   . Hypertension   . Maternal anemia in pregnancy, antepartum 02/18/2015    Past Surgical History:  Procedure Laterality Date  . CESAREAN SECTION N/A 04/26/2013   Procedure: CESAREAN SECTION;  Surgeon: Alwyn Pea, MD;  Location: [facility];  Service: Obstetrics;  Laterality: N/A;  . CESAREAN SECTION N/A 05/24/2015   Procedure: CESAREAN SECTION;  Surgeon: Everett Graff, MD;  Location: [facility];  Service: Obstetrics;  Laterality: N/A;  . NO PAST SURGERIES      There were no vitals filed for this visit.    Subjective Assessment - 05/14/20 1027    Subjective Pt enters clinic with need for interpreter   - pain for 4 years and getting worse in Left upper back  medial side of scapular. she describes the pain as wrappping around to her left breast and over ribs.  Pt wants to know the reason she is in pain.  Her pain medications do not work.    Patient is accompained by: Interpreter   [place] # [phone]   Pertinent History HTN, C section x 2, pseudo cerebri tumor    Limitations House hold activities    Patient Stated Goals reduce pain  in left upper back    Currently in Pain? Yes    Pain Score 6     Pain Location Thoracic    Pain Orientation Left    Pain  Descriptors / Indicators Sharp;Aching    Pain Type Chronic pain    Pain Radiating Towards radiating around L breast and ribs    Pain Onset More than a month ago    Pain Frequency Constant              [facility] PT Assessment - 05/14/20 0001      Assessment   Medical Diagnosis chronic thoracic left side pain    Referring Provider (PT) Karlton Lemon MD    Onset Date/Surgical Date --   4 years of ongoing discomfort and pain   Hand Dominance Left    Next MD Visit not scheduled    Prior Therapy none      Precautions   Precautions None      Restrictions   Weight Bearing Restrictions No      [place] residence    Available Help at Discharge Family    Type of Home House      Prior Function   Level of Independence Independent      Cognition   Overall Cognitive Status Within Functional Limits for tasks assessed      Observation/Other Assessments   Focus on Therapeutic Outcomes (FOTO)  Language  NA      Posture/Postural Control   Posture/Postural Control Postural limitations    Postural Limitations Rounded Shoulders;Forward head      ROM / Strength   AROM / PROM / Strength AROM;Strength      AROM   Overall AROM  Deficits    Overall AROM Comments UE and neck WFL      Strength   Overall Strength Within functional limits for tasks performed    Overall Strength Comments grossly 4+/5 limited by pain witht thoracic rotation  to RT  pain in lt thoracic      Palpation   Palpation comment TTP medial LT scapula over ribs 5.6 7 LT side                      Objective measurements completed on examination: See above findings.       [place] Adult PT Treatment/Exercise - 05/14/20 0001      Lumbar Exercises: Sidelying   Other Sidelying Lumbar Exercises bookopening sidelying thoracic Rotation open book x 10 to LT and x 10 to RT      Neck Exercises: Stretches   Upper Trapezius Stretch Left;3 reps;30 seconds    Upper Trapezius  Stretch Limitations VC and TC    Levator Stretch 3 reps;Left;30 seconds    Levator Stretch Limitations VC and TC            Trigger Point Dry Needling - 05/14/20 0001    Consent Given? Yes    Education Handout Provided Yes    Muscles Treated Head and Neck Upper trapezius;Levator scapulae   lt only   Muscles Treated Upper Quadrant Rhomboids;Subscapularis   lt only   Dry Needling Comments 40 mm .30 gage    Upper Trapezius Response Twitch reponse elicited;Palpable increased muscle length    Levator Scapulae Response Twitch response elicited;Palpable increased muscle length    Rhomboids Response Twitch response elicited;Palpable increased muscle length    Subscapularis Response Twitch response elicited;Palpable increased muscle length                PT Education - 05/14/20 1739    Education Details POC Explanation of findings.  Intial HEP education on TPDN and manual    Person(s) Educated Patient;Other (comment)   interpreter   Methods Explanation;Demonstration;Tactile cues;Verbal cues;Handout    Comprehension Verbalized understanding;Returned demonstration            PT Short Term Goals - 05/14/20 1741      PT SHORT TERM GOAL #1   Title STG=LTG             PT Long Term Goals - 05/14/20 1112      PT LONG TERM GOAL #1   Title Pt will be independent with all HEP given    Baseline no knowledge    Time 6    Period Weeks    Status New    Target Date 06/25/20      PT LONG TERM GOAL #2   Title Pt will be able to perform household chores such as putting up dishes and sweeping/vacuuming with 2/10 or less pain    Baseline Pt is unable to do household chores when her upper back pain is occurring    Time 6    Period Weeks    Status New    Target Date 06/25/20      PT LONG TERM GOAL #3   Title Pt will be able to lift 25 # above head in  order to lift objects without exacerbating Left rib pain    Baseline Pt avoids lifting above her head with any weighted object     Time 6    Period Weeks    Status New    Target Date 06/25/20      PT LONG TERM GOAL #4   Title Pt will be more aware of neutral posture while doing normal work/household chores    Baseline Pt unaware of proper body mechanics    Time 6    Period Weeks    Status New    Target Date 06/25/20             Access Code: [email or web address]: [email or web address]: 10/19/2021Prepared by: Donnetta Simpers BeardsleyExercises  Seated Gentle Upper Trapezius Stretch - 2-3 x daily - 7 x weekly - 1 sets - 3 reps - 30 hold  Gentle Levator Scapulae Stretch - 2-3 x daily - 7 x weekly - 3 sets - 3 reps - 30 hold  Sidelying Thoracic Rotation with Open Book - 2 x daily - 7 x weekly - 1 sets - 10 reps       Plan - 05/14/20 1026    Clinical Impression Statement Pt enters cliniic and does not want anyone to interpret for her but husband but husband has  child with him.  Electronid interpreter speaking [place] begins and pt is able to continue with evaluation. Pt with LT neck to Left medial scapular spasms.  Pt states she has had pain for 4 years that is painful sfor 4 years and has been increasing in pain and wraps around from left medial scapula to front of Left breast.  Pt has pain with compression of ribs 5,6.7 on Lt side.  Pt consents to TPDN and is closely monitored throughout session after TPDN and manual/mobs to thoracici PA grade 3/4 T-4 to T-8. Pt with 3/10 pain after session. Pt will benefit from skilled PT for impairments pain, posture traininig, body mechanics.muscle weakness in order to return to pain free level of function    Examination-Activity Limitations Lift    Stability/Clinical Decision Making Stable/Uncomplicated    Clinical Decision Making Low    Rehab Potential Good    PT Frequency 2x / week    PT Duration 6 weeks    PT Treatment/Interventions Joint Manipulations;Spinal Manipulations;Taping;Dry needling;Passive range of motion;Manual techniques;Patient/family education;Neuromuscular  re-education;Therapeutic exercise;Ultrasound;Moist Heat;Iontophoresis 4mg /ml Dexamethasone;Cryotherapy;Electrical Stimulation    PT Next Visit Plan TPDN  neck strength, upper back strength and extension ( superman) scapular stabilizers    PT Home Exercise Plan [email or web address]    Consulted and Agree with Plan of Care Patient           Patient will benefit from skilled therapeutic intervention in order to improve the following deficits and impairments:  Pain, Improper body mechanics, Postural dysfunction, Impaired UE functional use, Increased muscle spasms, Decreased range of motion, Decreased strength  Visit Diagnosis: Chronic left-sided thoracic back pain  Cramp and spasm  Abnormal posture     Problem List Patient Active Problem List   Diagnosis Date Noted  . Encounter for Papanicolaou smear of cervix 04/17/2020  . Chronic left-sided thoracic back pain 03/03/2020  . Screening for diabetes mellitus 12/31/2019  . Pseudotumor cerebri 11/24/2019  . Vitamin D deficiency 06/10/2019  . Primary insomnia 12/23/2016  . Dyslipidemia 05/14/2016  . Essential hypertension 11/21/2015  . Absolute anemia 05/23/2015    Voncille Lo, PT, [place] Certified Exercise Expert for the Aging Adult  05/14/20 5:54 PM Phone: [phone] Fax: [phone]  [place], [place], [address] Phone: [phone]   Fax:  [phone]  Name: Verl Whitmore MRN: [id number] Date of Birth: Nov 12, 1970

## 2020-05-14 NOTE — Patient Instructions (Addendum)
    Voncille Lo, PT, [place] Certified Exercise Expert for the Aging Adult  05/14/20 11:08 AM Phone: [phone] Fax: [phone]

## 2020-06-04 ENCOUNTER — Encounter: Payer: Self-pay | Admitting: Physical Therapy

## 2020-06-04 ENCOUNTER — Other Ambulatory Visit: Payer: Self-pay

## 2020-06-04 ENCOUNTER — Ambulatory Visit: Payer: BC Managed Care – PPO | Attending: Family Medicine | Admitting: Physical Therapy

## 2020-06-04 DIAGNOSIS — M546 Pain in thoracic spine: Secondary | ICD-10-CM | POA: Insufficient documentation

## 2020-06-04 DIAGNOSIS — G8929 Other chronic pain: Secondary | ICD-10-CM

## 2020-06-04 DIAGNOSIS — R252 Cramp and spasm: Secondary | ICD-10-CM | POA: Diagnosis present

## 2020-06-04 DIAGNOSIS — R293 Abnormal posture: Secondary | ICD-10-CM | POA: Insufficient documentation

## 2020-06-04 NOTE — Therapy (Addendum)
[place], [place], [address] Phone: [phone]   Fax:  [phone]  Physical Therapy Treatment/Discharge Note  Patient Details  Name: Megan Baxter MRN: [id number] Date of Birth: 1971/03/08 Referring Provider (PT): Karlton Lemon MD   Encounter Date: 06/04/2020   PT End of Session - 06/04/20 1408    Visit Number 2    Number of Visits 12    Date for PT Re-Evaluation 06/25/20    Authorization Type BCBS    PT Start Time 1016    PT Stop Time 1100    PT Time Calculation (min) 44 min    Activity Tolerance Patient tolerated treatment well    Behavior During Therapy [facility] for tasks assessed/performed           Past Medical History:  Diagnosis Date  . Heartburn in pregnancy   . Hypertension   . Maternal anemia in pregnancy, antepartum 02/18/2015    Past Surgical History:  Procedure Laterality Date  . CESAREAN SECTION N/A 04/26/2013   Procedure: CESAREAN SECTION;  Surgeon: Alwyn Pea, MD;  Location: [facility];  Service: Obstetrics;  Laterality: N/A;  . CESAREAN SECTION N/A 05/24/2015   Procedure: CESAREAN SECTION;  Surgeon: Everett Graff, MD;  Location: [facility];  Service: Obstetrics;  Laterality: N/A;  . NO PAST SURGERIES      There were no vitals filed for this visit.   Subjective Assessment - 06/04/20 1032    Subjective discussed need for husband to interpret and be available each time    Patient is accompained by: Family member   must sign form   Pertinent History HTN, C section x 2, pseudo cerebri tumor    Limitations House hold activities    Currently in Pain? Yes    Pain Score 5     Pain Location Thoracic    Pain Orientation Left    Pain Descriptors / Indicators Sharp;Aching    Pain Type Chronic pain                             [facility] Adult PT Treatment/Exercise - 06/04/20 0001      Lumbar Exercises: Sidelying   Other Sidelying Lumbar Exercises bookopening sidelying  thoracic Rotation open book x 10 to LT and x 10 to RT      Shoulder Exercises: Standing   External Rotation Theraband;10 reps;Strengthening;Both    Theraband Level (Shoulder External Rotation) Level 2 (Red)    External Rotation Weight (lbs) x3    External Rotation Limitations Vc and TC    Extension Strengthening;10 reps;Theraband;Both    Theraband Level (Shoulder Extension) Level 2 (Red)    Extension Weight (lbs) x3 VC and TC    Row Theraband;Strengthening;Both;10 reps    Theraband Level (Shoulder Row) Level 2 (Red)    Row Weight (lbs) x 3 TC and VC    Other Standing Exercises UE wall slides x 20      Manual Therapy   Manual Therapy Joint mobilization;Soft tissue mobilization    Joint Mobilization thoracic PA mobs and  left Rib mob grade 4 pistol grip in supine. with cavitation.    Soft tissue mobilization STW to Left Rhomboids and thoracic paraspinals      Neck Exercises: Stretches   Upper Trapezius Stretch Left;3 reps;30 seconds    Upper Trapezius Stretch Limitations VC and TC    Levator Stretch 3 reps;Left;30 seconds    Levator Stretch Limitations VC and  TC                  PT Education - 06/04/20 1048    Education Details added to HEP  for upper back pain    Person(s) Educated Patient;Spouse   spoouse on phone   Methods Explanation;Demonstration;Tactile cues;Verbal cues;Handout    Comprehension Returned demonstration;Verbalized understanding            PT Short Term Goals - 05/14/20 1741      PT SHORT TERM GOAL #1   Title STG=LTG             PT Long Term Goals - 05/14/20 1112      PT LONG TERM GOAL #1   Title Pt will be independent with all HEP given    Baseline no knowledge    Time 6    Period Weeks    Status New    Target Date 06/25/20      PT LONG TERM GOAL #2   Title Pt will be able to perform household chores such as putting up dishes and sweeping/vacuuming with 2/10 or less pain    Baseline Pt is unable to do household chores when her  upper back pain is occurring    Time 6    Period Weeks    Status New    Target Date 06/25/20      PT LONG TERM GOAL #3   Title Pt will be able to lift 25 # above head in order to lift objects without exacerbating Left rib pain    Baseline Pt avoids lifting above her head with any weighted object    Time 6    Period Weeks    Status New    Target Date 06/25/20      PT LONG TERM GOAL #4   Title Pt will be more aware of neutral posture while doing normal work/household chores    Baseline Pt unaware of proper body mechanics    Time 6    Period Weeks    Status New    Target Date 06/25/20                 Plan - 06/04/20 1038    Clinical Impression Statement Pt needing interpreter but refusing interpreter even by  electronic or by specific need.  Husband was available by phone and will continue to be available for future appt.  Pt was explained need for an interpreter and that she could not just " copy" what I did for exercises. Pt with decrease in pain after manual and Ther ex.  Pt refused any more TPDN after last experience prefering to do exercises    Examination-Activity Limitations Lift    PT Frequency 2x / week    PT Duration 6 weeks    PT Treatment/Interventions Joint Manipulations;Spinal Manipulations;Taping;Dry needling;Passive range of motion;Manual techniques;Patient/family education;Neuromuscular re-education;Therapeutic exercise;Ultrasound;Moist Heat;Iontophoresis 29m/ml Dexamethasone;Cryotherapy;Electrical Stimulation    PT Next Visit Plan assess TPDN    PT Home Exercise Plan [email or web address]          Patient will benefit from skilled therapeutic intervention in order to improve the following deficits and impairments:  Pain, Improper body mechanics, Postural dysfunction, Impaired UE functional use, Increased muscle spasms, Decreased range of motion, Decreased strength  Visit Diagnosis: Chronic left-sided thoracic back pain  Abnormal posture  Cramp and spasm  Access  Code: [email or web address]: [email or web address]: 11/09/2021Prepared by: LDonnetta SimpersBeardsleyExercises  Shoulder External Rotation and Scapular Retraction with Resistance - 2 x daily - 7  x weekly - 3 sets - 10 reps  Scapular Retraction with Resistance - 2 x daily - 7 x weekly - 3 sets - 10 reps  Scapular Retraction with Resistance Advanced - 2 x daily - 7 x weekly - 3 sets - 10 reps  Sidelying Open Book Thoracic Lumbar Rotation and Extension - 1 x daily - 7 x weekly - 2 sets - 10 reps  Seated Gentle Upper Trapezius Stretch - 1 x daily - 7 x weekly - 1 sets - 3 reps - 20-30 sec hold  Gentle Levator Scapulae Stretch - 1 x daily - 7 x weekly - 1 sets - 3 reps - 2-3 hold    Problem List Patient Active Problem List   Diagnosis Date Noted  . Encounter for Papanicolaou smear of cervix 04/17/2020  . Chronic left-sided thoracic back pain 03/03/2020  . Screening for diabetes mellitus 12/31/2019  . Pseudotumor cerebri 11/24/2019  . Vitamin D deficiency 06/10/2019  . Primary insomnia 12/23/2016  . Dyslipidemia 05/14/2016  . Essential hypertension 11/21/2015  . Absolute anemia 05/23/2015   Voncille Lo, PT, [place] Certified Exercise Expert for the Aging Adult  06/04/20 2:17 PM Phone: [phone] Fax: [facility] [address] [place], [place], [address] Phone: [phone]   Fax:  [phone]  Name: Falesha Schommer MRN: [id number] Date of Birth: 09-16-1970   PHYSICAL THERAPY DISCHARGE SUMMARY  Visits from Start of Care: 2  Current functional level related to goals / functional outcomes: unknown   Remaining deficits: unknown   Education / Equipment: Initial HEP Plan:                                                    Patient goals were not met. Patient is being discharged due to not returning since the last visit.  ?????    Voncille Lo, PT, [place] [facility] Certified Exercise Expert for the Aging Adult  09/03/20  8:25 AM Phone: [phone] Fax: [phone]

## 2020-06-04 NOTE — Patient Instructions (Signed)
° ° ° °  Voncille Lo, PT, [place] Certified Exercise Expert for the Aging Adult  06/04/20 10:48 AM Phone: [phone] Fax: [phone]

## 2020-06-11 ENCOUNTER — Ambulatory Visit: Payer: BC Managed Care – PPO | Admitting: Physical Therapy

## 2020-06-18 ENCOUNTER — Telehealth: Payer: Self-pay | Admitting: Physical Therapy

## 2020-06-18 ENCOUNTER — Ambulatory Visit: Payer: BC Managed Care – PPO | Admitting: Physical Therapy

## 2020-06-18 NOTE — Telephone Encounter (Signed)
LVM on phone about missed appt today at 10:15.  Pt did not show for appt and was called and left office number to confirm other appt. Attendance policy /cancellation reminder given. Excessive cancellations /no shows will indicate need to take scheduled appt off of schedule and schedule one at a time.  Voncille Lo, PT, [place] Certified Exercise Expert for the Aging Adult  06/18/20 11:19 AM Phone: [phone] Fax: [phone]

## 2020-06-25 ENCOUNTER — Ambulatory Visit: Payer: BC Managed Care – PPO | Admitting: Physical Therapy

## 2020-06-25 ENCOUNTER — Telehealth: Payer: Self-pay | Admitting: Physical Therapy

## 2020-06-25 NOTE — Telephone Encounter (Signed)
Pt did not show for 2nd appt.  Pt was left message reminding of attendance policy and that all appt would be cancelled and made one a time if she did not show for final appt next week.  Pt given office number to call with any questions.  Voncille Lo, PT, [place] Certified Exercise Expert for the Aging Adult  06/25/20 2:12 PM Phone: [phone] Fax: [phone]

## 2020-07-02 ENCOUNTER — Ambulatory Visit: Payer: BC Managed Care – PPO | Admitting: Physical Therapy

## 2020-07-09 ENCOUNTER — Encounter: Payer: BC Managed Care – PPO | Admitting: Physical Therapy

## 2020-08-06 ENCOUNTER — Other Ambulatory Visit: Payer: Self-pay | Admitting: *Deleted

## 2020-08-06 DIAGNOSIS — G932 Benign intracranial hypertension: Secondary | ICD-10-CM

## 2020-08-06 DIAGNOSIS — I10 Essential (primary) hypertension: Secondary | ICD-10-CM

## 2020-08-06 MED ORDER — LABETALOL HCL 300 MG PO TABS: 600.0000 mg | ORAL_TABLET | Freq: Two times a day (BID) | ORAL | 0 refills | Status: DC

## 2020-08-15 NOTE — Progress Notes (Signed)
     SUBJECTIVE:   CHIEF COMPLAINT / HPI:   Megan Baxter is a 50 y.o. female presents for back pain follow up  Husband interpreted for this encounter, refused interpretor  Left thoracic back pain Since last clinic visit she has seen Sports Medicine and done rehab. She does not enjoy rehab and "does not see the point". She does not want to go back. Has been doing tylenol, motrin and gel which has not helped much for the pain. Is requesting further tests for her back pain.   Hypertension: Patient's current antihypertensive  medications include: labetalol 600mg  BID, diamox 500mg  BID. Compliant with medications and tolerating well without side effects.  Checking BP at home with readings between 573-220 and 254 diastolic. Denies any SOB, CP, vision changes, LE edema, medication SEs, or symptoms of hypotension.   Most recent creatinine trend:  Lab Results  Component Value Date   CREATININE 0.94 08/16/2020   CREATININE 0.89 11/29/2019   CREATININE 0.76 06/05/2019     Patient has had a BMP in the past 1 year.  Health maintenance  Covid vaccine: refused booster today Flu vaccine: overdue  Colonoscopy: overdue  Pap: up to date  PERTINENT  PMH / PSH: back pain, pseudotumor cerebri  OBJECTIVE:   BP (!) 170/98   Pulse 68   Wt 151 lb 6.4 oz (68.7 kg)   SpO2 99%   BMI 25.19 kg/m    General: Alert, no acute distress, cooperative  Cardio: Normal S1 and S2, RRR, no r/m/g Pulm: CTAB, normal work of breathing Abdomen: Bowel sounds normal. Abdomen soft and non-tender.  Extremities: No peripheral edema.  Neuro: Cranial nerves grossly intact   ASSESSMENT/PLAN:   Chronic left-sided thoracic back pain On going musculoskeletal thoracic back pain. Pt has seen by Dr Barbaraann Barthel at Sports Medicine back in Sept 2021 who discussed possibility of lower cervical radiculopathy causing her pain. He recommended PT and follow up in 6 weeks. She did do 2 sessions of PT but did not follow up as she did  not find it helpful. I have order cervical spine xray today, recommended by Dr Barbaraann Barthel if no improvement in her sx. I explained that there is not much else we can do except pain management and PT. Encouraged returning to PT and home exercises.  Essential hypertension BP significantly elevated, 186/100 and 170/98. Pt is asymptomatic. Started on Losartan 25mg . Obtained BMP today. F/u in 2 weeks with myself, will consider increasing to 50mg  if still elevated.  Encounter for hepatitis C screening test for low risk patient Obtained hep c antibody today for hepatitis screening.     Lattie Haw, MD PGY-2 [place]

## 2020-08-16 ENCOUNTER — Encounter: Payer: Self-pay | Admitting: Family Medicine

## 2020-08-16 ENCOUNTER — Other Ambulatory Visit: Payer: Self-pay | Admitting: Family Medicine

## 2020-08-16 ENCOUNTER — Other Ambulatory Visit: Payer: Self-pay

## 2020-08-16 ENCOUNTER — Ambulatory Visit (INDEPENDENT_AMBULATORY_CARE_PROVIDER_SITE_OTHER): Payer: BC Managed Care – PPO | Admitting: Family Medicine

## 2020-08-16 VITALS — BP 170/98 | HR 68 | Wt 151.4 lb

## 2020-08-16 DIAGNOSIS — G8929 Other chronic pain: Secondary | ICD-10-CM

## 2020-08-16 DIAGNOSIS — M546 Pain in thoracic spine: Secondary | ICD-10-CM | POA: Diagnosis not present

## 2020-08-16 DIAGNOSIS — Z1159 Encounter for screening for other viral diseases: Secondary | ICD-10-CM

## 2020-08-16 DIAGNOSIS — I10 Essential (primary) hypertension: Secondary | ICD-10-CM

## 2020-08-16 DIAGNOSIS — G932 Benign intracranial hypertension: Secondary | ICD-10-CM

## 2020-08-16 MED ORDER — LOSARTAN POTASSIUM 25 MG PO TABS
25.0000 mg | ORAL_TABLET | Freq: Every day | ORAL | 3 refills | Status: DC
Start: 2020-08-16 — End: 2020-08-30

## 2020-08-16 NOTE — Patient Instructions (Addendum)
  Thank you for coming to see me today. It was a pleasure. Today we discussed your back pain. I am sorry that you did not get on well with the therapy. I would still recommend going back to sports medicine and continuing the exercises they recommended. I am ordering a xray of the neck. You can go to this without an appointment.  This is the address [address],  [address] Phone [phone] Get Directions   Your BP was very high. I am starting you on a new medicine called losartan. Take this daily. Continue to monitor your BP at home.  Please follow-up with me 1-2 weeks.  If you have any questions or concerns, please do not hesitate to call the office at [phone].  If you develop fevers>100.5, shortness of breath, chest pain, palpitations, dizziness, abdominal pain, nausea, vomiting, diarrhea or cannot eat or drink then please go to the ER immediately.  Best wishes,   Dr Posey Pronto          We will get some labs today.  If they are abnormal or we need to do something about them, I will call you.  If they are normal, I will send you a message on MyChart (if it is active) or a letter in the mail.  If you don't hear from [place] in 2 weeks, please call the office at the number below.

## 2020-08-17 LAB — BASIC METABOLIC PANEL
BUN/Creatinine Ratio: 18 (ref 9–23)
BUN: 17 mg/dL (ref 6–24)
CO2: 16 mmol/L — ABNORMAL LOW (ref 20–29)
Calcium: 9.1 mg/dL (ref 8.7–10.2)
Chloride: 111 mmol/L — ABNORMAL HIGH (ref 96–106)
Creatinine, Ser: 0.94 mg/dL (ref 0.57–1.00)
GFR calc Af Amer: 83 mL/min/{1.73_m2} (ref >59)
GFR calc non Af Amer: 72 mL/min/{1.73_m2} (ref >59)
Glucose: 88 mg/dL (ref 65–99)
Potassium: 3.6 mmol/L (ref 3.5–5.2)
Sodium: 139 mmol/L (ref 134–144)

## 2020-08-17 LAB — HEPATITIS C ANTIBODY: Hep C Virus Ab: <0.1 s/co ratio (ref 0.0–0.9)

## 2020-08-18 DIAGNOSIS — Z1159 Encounter for screening for other viral diseases: Secondary | ICD-10-CM | POA: Insufficient documentation

## 2020-08-18 NOTE — Assessment & Plan Note (Signed)
Obtained hep c antibody today for hepatitis screening.

## 2020-08-18 NOTE — Assessment & Plan Note (Signed)
BP significantly elevated, 186/100 and 170/98. Pt is asymptomatic. Started on Losartan 25mg . Obtained BMP today. F/u in 2 weeks with myself, will consider increasing to 50mg  if still elevated.

## 2020-08-18 NOTE — Assessment & Plan Note (Signed)
On going musculoskeletal thoracic back pain. Pt has seen by Dr Barbaraann Barthel at Sports Medicine back in Sept 2021 who discussed possibility of lower cervical radiculopathy causing her pain. He recommended PT and follow up in 6 weeks. She did do 2 sessions of PT but did not follow up as she did not find it helpful. I have order cervical spine xray today, recommended by Dr Barbaraann Barthel if no improvement in her sx. I explained that there is not much else we can do except pain management and PT. Encouraged returning to PT and home exercises.

## 2020-08-30 ENCOUNTER — Encounter: Payer: Self-pay | Admitting: Family Medicine

## 2020-08-30 ENCOUNTER — Ambulatory Visit (INDEPENDENT_AMBULATORY_CARE_PROVIDER_SITE_OTHER): Payer: BC Managed Care – PPO | Admitting: Family Medicine

## 2020-08-30 ENCOUNTER — Other Ambulatory Visit: Payer: Self-pay

## 2020-08-30 DIAGNOSIS — I10 Essential (primary) hypertension: Secondary | ICD-10-CM

## 2020-08-30 MED ORDER — LOSARTAN POTASSIUM 50 MG PO TABS: 50.0000 mg | ORAL_TABLET | Freq: Every day | ORAL | 3 refills | Status: DC

## 2020-08-30 NOTE — Progress Notes (Signed)
     SUBJECTIVE:   CHIEF COMPLAINT / HPI:   Megan Baxter is a 50 y.o. female presents for HTN follow up.  Husband also present at visit. Declined interpretor.  Hypertension: Patient's current antihypertensive  medications include: Losartan 25mg  and labetalol 300mg . She has not been taking Labetalol as she was confused whether she should be taking this. Denies any SOB, CP, vision changes, LE edema, medication SEs, or symptoms of hypotension.   Most recent creatinine trend:  Lab Results  Component Value Date   CREATININE 0.94 08/16/2020   CREATININE 0.89 11/29/2019   CREATININE 0.76 06/05/2019     Patient has had a BMP in the past 1 year.  PERTINENT  PMH / PSH: pseudotumor cerebri, HTN,   OBJECTIVE:   BP (!) 182/100   Pulse 79   Ht 5\' 5"  (1.651 m)   Wt 152 lb (68.9 kg)   LMP 08/29/2020 (Exact Date)   SpO2 98%   BMI 25.29 kg/m    General: Alert, no acute distress, pleasant, cooperative  Cardio: Normal S1 and S2, RRR, no r/m/g Pulm: CTAB, normal work of breathing Abdomen: Bowel sounds normal. Abdomen soft and non-tender.  Extremities: No peripheral edema.  Neuro: Cranial nerve exam normal   ASSESSMENT/PLAN:   Essential hypertension BP elevated to 182/100. Denies chest pain, headaches, vision changes etc. BMP obtained 2 weeks ago wnl. Increased Losartan to 50mg . Continue Labetalol 300mg  TID. Recommended pt that she takes both losartan and labetalol for her BP and keeps BP diary at home until next visit. Explained increased risk of MI, stroke, kidney injury with uncontrolled HTN. Will work on weaning off labetalol and onto amlodipine/diuretic once BP better controlled.     Lattie Haw, MD PGY-2 [place]

## 2020-08-30 NOTE — Patient Instructions (Signed)
Thank you for coming to see me today. It was a pleasure. Today we discussed your BP it is still very high. Double the dose of LOSARTAN to 50mg .  Please follow-up with me in 1-2 weeks   If you have any questions or concerns, please do not hesitate to call the office at [phone].  If you develop fevers>100.5, shortness of breath, chest pain, palpitations, dizziness, abdominal pain, nausea, vomiting, diarrhea or cannot eat or drink then please go to the ER immediately.  Best wishes,   Dr Posey Pronto

## 2020-08-31 NOTE — Assessment & Plan Note (Addendum)
BP elevated to 182/100. Denies chest pain, headaches, vision changes etc. BMP obtained 2 weeks ago wnl. Increased Losartan to 50mg . Continue Labetalol 300mg  TID. Recommended pt that she takes both losartan and labetalol for her BP and keeps BP diary at home until next visit. Explained increased risk of MI, stroke, kidney injury with uncontrolled HTN. Will work on weaning off labetalol and onto amlodipine/diuretic once BP better controlled.

## 2020-09-13 ENCOUNTER — Encounter: Payer: Self-pay | Admitting: Family Medicine

## 2020-09-13 ENCOUNTER — Other Ambulatory Visit: Payer: Self-pay

## 2020-09-13 ENCOUNTER — Ambulatory Visit (INDEPENDENT_AMBULATORY_CARE_PROVIDER_SITE_OTHER): Payer: BC Managed Care – PPO | Admitting: Family Medicine

## 2020-09-13 DIAGNOSIS — R238 Other skin changes: Secondary | ICD-10-CM | POA: Insufficient documentation

## 2020-09-13 DIAGNOSIS — I10 Essential (primary) hypertension: Secondary | ICD-10-CM | POA: Diagnosis not present

## 2020-09-13 NOTE — Patient Instructions (Signed)
Thank you for coming to see me today. It was a pleasure. Today we discussed you BP. It is perfect. I recommend containing the medications as they are.  Please follow-up with me in 4 weeks   If you have any questions or concerns, please do not hesitate to call the office at [phone].   Best wishes,   Dr Posey Pronto

## 2020-09-13 NOTE — Progress Notes (Signed)
     SUBJECTIVE:   CHIEF COMPLAINT / HPI:   Megan Baxter is a 50 y.o. female presents for HTN follow up   Hypertension: Patient's current antihypertensive  medications include: labetalol, losartan, acetazolamide.  Compliant with medications and tolerating well without side effects.  Checking BP at home with readings between 130 and 155 mmHg. Denies any SOB, CP, vision changes, LE edema, medication SEs, or symptoms of hypotension.   Most recent creatinine trend:  Lab Results  Component Value Date   CREATININE 0.94 08/16/2020   CREATININE 0.89 11/29/2019   CREATININE 0.76 06/05/2019     Patient has had a BMP in the past 1 year.  Lump on scalp  Feels painful lump on left parietal side of scalp which started a few days ago. Does not wear hair in tight ponytail. No new shampoos. Denies fevers or cervical lymphadenopathy.  PERTINENT  PMH / PSH: HTN,  Pseudotumor cerebri  OBJECTIVE:   BP 130/86   Pulse 68   Ht 5\' 5"  (1.651 m)   Wt 147 lb (66.7 kg)   LMP 08/29/2020 (Exact Date)   SpO2 99%   BMI 24.46 kg/m    General: Alert, no acute distress HEENT: NCAT, no obvious palpable lump over scalp, healthy scalp and hair follicles, no obvious erythema, no cervical lymphadenopathy  Cardio: well perfused  Pulm: normal work of breathing Neuro: Cranial nerves grossly intact   ASSESSMENT/PLAN:   Essential hypertension BP 130/86 and at goal. Congratulated pt on medication compliance and encouraged her to keep up the great work. Follow 2-3 months. No changes to medications today.  Scalp irritation No obvious palpable masses or rashes on scalp exam today. Healthy scalp and hair follicles. Recommended tylenol for pain and monitoring her sx. If she continues to feel a lump/ it gets bigger or has fevers then to follow up in clinic.      Lattie Haw, MD PGY-2 [place]

## 2020-09-13 NOTE — Assessment & Plan Note (Signed)
BP 130/86 and at goal. Congratulated pt on medication compliance and encouraged her to keep up the great work. Follow 2-3 months. No changes to medications today.

## 2020-09-13 NOTE — Assessment & Plan Note (Addendum)
No obvious palpable masses or rashes on scalp exam today. Healthy scalp and hair follicles. Recommended tylenol for pain and monitoring her sx. If she continues to feel a lump/ it gets bigger or has fevers then to follow up in clinic.

## 2020-10-27 ENCOUNTER — Other Ambulatory Visit: Payer: Self-pay | Admitting: Family Medicine

## 2021-04-22 ENCOUNTER — Other Ambulatory Visit: Payer: Self-pay | Admitting: Family Medicine

## 2021-04-22 DIAGNOSIS — Z1231 Encounter for screening mammogram for malignant neoplasm of breast: Secondary | ICD-10-CM

## 2021-05-28 ENCOUNTER — Other Ambulatory Visit: Payer: Self-pay

## 2021-05-28 ENCOUNTER — Ambulatory Visit
Admission: RE | Admit: 2021-05-28 | Discharge: 2021-05-28 | Disposition: A | Discharge status: Home / Self Care | Payer: BC Managed Care – PPO | Source: Ambulatory Visit | Attending: Family Medicine | Admitting: Family Medicine

## 2021-05-28 DIAGNOSIS — Z1231 Encounter for screening mammogram for malignant neoplasm of breast: Secondary | ICD-10-CM

## 2021-06-23 ENCOUNTER — Other Ambulatory Visit: Payer: Self-pay | Admitting: Family Medicine

## 2021-06-23 DIAGNOSIS — G932 Benign intracranial hypertension: Secondary | ICD-10-CM

## 2021-06-23 DIAGNOSIS — I10 Essential (primary) hypertension: Secondary | ICD-10-CM

## 2021-09-07 ENCOUNTER — Other Ambulatory Visit: Payer: Self-pay | Admitting: Family Medicine

## 2021-11-12 ENCOUNTER — Other Ambulatory Visit: Payer: Self-pay | Admitting: Family Medicine

## 2021-11-12 ENCOUNTER — Ambulatory Visit (INDEPENDENT_AMBULATORY_CARE_PROVIDER_SITE_OTHER): Payer: Self-pay | Admitting: Family Medicine

## 2021-11-12 DIAGNOSIS — G932 Benign intracranial hypertension: Secondary | ICD-10-CM

## 2021-11-12 DIAGNOSIS — I10 Essential (primary) hypertension: Secondary | ICD-10-CM

## 2021-11-12 MED ORDER — ACETAZOLAMIDE ER 500 MG PO CP12: 500.0000 mg | ORAL_CAPSULE | Freq: Two times a day (BID) | ORAL | 0 refills | Status: DC

## 2021-11-12 MED ORDER — LABETALOL HCL 300 MG PO TABS: 600.0000 mg | ORAL_TABLET | Freq: Two times a day (BID) | ORAL | 0 refills | Status: DC

## 2021-11-12 MED ORDER — ATORVASTATIN CALCIUM 20 MG PO TABS
20.0000 mg | ORAL_TABLET | Freq: Every day | ORAL | 0 refills | Status: DC
Start: 2021-11-12 — End: 2022-01-21

## 2021-11-12 MED ORDER — LOSARTAN POTASSIUM 50 MG PO TABS: 50.0000 mg | ORAL_TABLET | Freq: Every day | ORAL | 0 refills | Status: DC

## 2021-11-12 NOTE — Patient Instructions (Signed)
Please take your medications daily. I have provided 90 day supplies for each of them.  ? ?Please follow up with [place] next week for a blood pressure check.  ? ?Please go to the ED if you have weakness on one side of your body, confusion, severe headache or dizziness.  ? ? ?

## 2021-11-12 NOTE — Progress Notes (Addendum)
? ? ?  SUBJECTIVE:  ? ?CHIEF COMPLAINT / HPI: Elevated blood pressure ? ?Patient is [facility] speaking. Interpreter was present for this encounter via ipad or in-person.  ? ?Patient reports that she does not check her BP at home but knows signs  ?She has been having HA, neck pain and blurry vision so she knows her BP is elevated  ?She has been having these symptoms since 1 week ?She reports she has not been taking any of her blood pressure medications due to being completely out of them for 5-6 weeks ?Patient reports that her vision becomes blurry when her blood pressure is elevated  ? ?PERTINENT  PMH / PSH:  ?Hypertension ?Pseudotumor cerebra ?Anemia ? ?OBJECTIVE:  ? ?BP (!) 192/88   Pulse 83   Ht '5\' 5"'$  (1.651 m)   Wt 159 lb 3.2 oz (72.2 kg)   LMP 10/31/2021   SpO2 97%   BMI 26.49 kg/m?   ?Physical Exam ?Constitutional:   ?   General: She is not in acute distress. ?   Appearance: Normal appearance. She is not ill-appearing, toxic-appearing or diaphoretic.  ?Eyes:  ?   Extraocular Movements: Extraocular movements intact.  ?Cardiovascular:  ?   Rate and Rhythm: Normal rate and regular rhythm.  ?   Pulses: Normal pulses.  ?   Heart sounds: Normal heart sounds.  ?Pulmonary:  ?   Effort: Pulmonary effort is normal.  ?   Breath sounds: No wheezing or rales.  ?Skin: ?   Capillary Refill: Capillary refill takes less than 2 seconds.  ?Neurological:  ?   Mental Status: She is alert and oriented to person, place, and time.  ?   Cranial Nerves: Cranial nerves 2-12 are intact. No cranial nerve deficit, dysarthria or facial asymmetry.  ?   Sensory: Sensation is intact.  ?   Motor: Motor function is intact. No weakness, tremor or abnormal muscle tone.  ?   Gait: Gait is intact.  ? ? ?ASSESSMENT/PLAN:  ? ?Essential hypertension ?Uncontrolled ?Patient states that she has been out of her antihypertensive medications for several weeks ?Refill Diamox 500 mg daily ?-Refill losartan 50 mg daily ?Refill labetalol 600 mg twice  daily ?Counseled extensively on importance of regular adherence to medications ?Provided refills for medications ?Patient to follow-up in 1 week ?No red flag symptoms on exam today, appears to be neurologically intact ?Discussed precautions to return to the ED, patient voiced understanding ?Virtual interpreter was present for this encounter ? ?  ? ? ?Eulis Foster, MD ?[place]  ?

## 2021-11-16 NOTE — Assessment & Plan Note (Signed)
Uncontrolled ?Patient states that she has been out of her antihypertensive medications for several weeks ?Refill Diamox 500 mg daily ?-Refill losartan 50 mg daily ?Refill labetalol 600 mg twice daily ?Counseled extensively on importance of regular adherence to medications ?Provided refills for medications ?Patient to follow-up in 1 week ?No red flag symptoms on exam today, appears to be neurologically intact ?Discussed precautions to return to the ED, patient voiced understanding ?Virtual interpreter was present for this encounter ? ? ?

## 2021-11-17 ENCOUNTER — Ambulatory Visit (INDEPENDENT_AMBULATORY_CARE_PROVIDER_SITE_OTHER): Payer: Self-pay | Admitting: Family Medicine

## 2021-11-17 ENCOUNTER — Encounter: Payer: Self-pay | Admitting: Family Medicine

## 2021-11-17 DIAGNOSIS — I10 Essential (primary) hypertension: Secondary | ICD-10-CM

## 2021-11-17 DIAGNOSIS — G932 Benign intracranial hypertension: Secondary | ICD-10-CM

## 2021-11-17 MED ORDER — LABETALOL HCL 300 MG PO TABS: 600.0000 mg | ORAL_TABLET | Freq: Two times a day (BID) | ORAL | 0 refills | Status: DC

## 2021-11-17 MED ORDER — BLOOD PRESSURE MONITOR/L CUFF MISC: 1.0000 | Freq: Once | 0 refills | Status: AC

## 2021-11-17 NOTE — Progress Notes (Signed)
? ? ? ?  SUBJECTIVE:  ? ?CHIEF COMPLAINT / HPI:  ? ?Megan Baxter is a 51 y.o. female presents for HTN follow up ? ?Daughter present at this visit. ?  ?Hypertension ?Patient's current antihypertensive  medications include: acetazolamide, losartan and labetalol (she only 5-6 tablets left). Does not check BP at home.Compliant with medications and tolerating well without side effects.  Denies any SOB, CP, vision changes, LE edema, medication SEs, or symptoms of hypotension.  ? ?Most recent creatinine trend:  ?Lab Results  ?Component Value Date  ? CREATININE 0.94 08/16/2020  ? CREATININE 0.89 11/29/2019  ? CREATININE 0.76 06/05/2019  ?  ?Patient has not had a BMP in the past 1 year. ? ?[place] Office Visit from 11/17/2021 in [place]  ?PHQ-9 Total Score 1  ? ?  ?   ? ?PERTINENT  PMH / PSH: HTN, pseudotumor cerebri  ? ?OBJECTIVE:  ? ?BP (!) 203/87   Pulse 89   Wt 164 lb (74.4 kg)   LMP 10/31/2021   BMI 27.29 kg/m?   ? ?148/108 ? ?General: Alert, no acute distress ?Cardio: well perfused ?Pulm: normal work of breathing ?Neuro: Cranial nerves grossly intact  ? ?ASSESSMENT/PLAN:  ? ?Essential hypertension ?Uncontrolled HTN. Initial 203/87, on recheck BP 148/108. Pt reports difficulty affording medications at her current pharmacy and she only had a few pills of her labetalol. I have sent labetalol to [facility] which should be cheaper for pt as well as a BP cuff. Recommends she checks her BP at home twice a day this week. Follow up with me in 3 days for BP recheck. I will aim to wean her off the labetalol as this is not a usual anti-hypertensive for HTN (it was started in her pregnancy) and start her on amlodipine. Due to lab closure today unable to obtain BMP. Will check BMP at next visit in 3 days. Strict ER precautions given to pt.   ?  ? ?Lattie Haw, MD PGY-3 ?[place]  ?

## 2021-11-17 NOTE — Patient Instructions (Signed)
?  Merci d??tre venus me voir aujourd?hui. Ce fut un plaisir. Aujourd?hui, nous avons discut? de votre hypertension art?rielle. Il est encore assez ?lev?Marland Kitchen J?ai envoy? plus de lab?talol ? la pharmacie et un brassard BP. Veuillez v?rifier votre tension art?rielle ? la maison et l?apporter ? la prochaine visite  ? ?S?il vous pla?t faire un suivi avec moi dans 3 jours pour [employer] v?rification de BP ? ?S?il est encore ?lev?, j?augmenterai la dose de losartan  ? ?Si vous avez des questions ou des pr?occupations, n?h?sitez pas ? appeler [facility] [phone]. ? ?Meilleurs voeux  ? ?Dr Posey Pronto   ? ? ?Thank you for coming to see me today. It was a pleasure. Today we discussed your high blood pressure. It is still quite high. I have sent more labetalol to the pharmacy and a BP cuff. Please check your  blood pressure at home and bring it to the next visit  ? ?Please follow-up with me in 3 days for BP recheck ? ?If it is still high I will increase the dose of the losartan  ? ?If you have any questions or concerns, please do not hesitate to call the office at [phone]. ? ?Best wishes,  ? ?Dr Posey Pronto   ?

## 2021-11-18 ENCOUNTER — Other Ambulatory Visit: Payer: Self-pay

## 2021-11-18 ENCOUNTER — Encounter (HOSPITAL_COMMUNITY): Payer: Self-pay

## 2021-11-18 ENCOUNTER — Emergency Department (HOSPITAL_COMMUNITY)
Admission: EM | Admit: 2021-11-18 | Discharge: 2021-11-18 | Disposition: A | Discharge status: Home / Self Care | Payer: Self-pay | Attending: Emergency Medicine | Admitting: Emergency Medicine

## 2021-11-18 ENCOUNTER — Emergency Department (HOSPITAL_COMMUNITY): Payer: Self-pay

## 2021-11-18 DIAGNOSIS — R519 Headache, unspecified: Secondary | ICD-10-CM | POA: Insufficient documentation

## 2021-11-18 DIAGNOSIS — I10 Essential (primary) hypertension: Secondary | ICD-10-CM | POA: Insufficient documentation

## 2021-11-18 DIAGNOSIS — Z79899 Other long term (current) drug therapy: Secondary | ICD-10-CM | POA: Insufficient documentation

## 2021-11-18 LAB — CBC WITH DIFFERENTIAL/PLATELET
Abs Immature Granulocytes: 0.01 10*3/uL (ref 0.00–0.07)
Basophils Absolute: 0.1 10*3/uL (ref 0.0–0.1)
Basophils Relative: 1 %
Eosinophils Absolute: 0.2 10*3/uL (ref 0.0–0.5)
Eosinophils Relative: 3 %
HCT: 33.2 % — ABNORMAL LOW (ref 36.0–46.0)
Hemoglobin: 10.8 g/dL — ABNORMAL LOW (ref 12.0–15.0)
Immature Granulocytes: 0 %
Lymphocytes Relative: 37 %
Lymphs Abs: 2.5 10*3/uL (ref 0.7–4.0)
MCH: 25.2 pg — ABNORMAL LOW (ref 26.0–34.0)
MCHC: 32.5 g/dL (ref 30.0–36.0)
MCV: 77.6 fL — ABNORMAL LOW (ref 80.0–100.0)
Monocytes Absolute: 0.6 10*3/uL (ref 0.1–1.0)
Monocytes Relative: 8 %
Neutro Abs: 3.4 10*3/uL (ref 1.7–7.7)
Neutrophils Relative %: 51 %
Platelets: 450 10*3/uL — ABNORMAL HIGH (ref 150–400)
RBC: 4.28 MIL/uL (ref 3.87–5.11)
RDW: 16.6 % — ABNORMAL HIGH (ref 11.5–15.5)
WBC: 6.7 10*3/uL (ref 4.0–10.5)
nRBC: 0 % (ref 0.0–0.2)

## 2021-11-18 LAB — COMPREHENSIVE METABOLIC PANEL
ALT: 17 U/L (ref 0–44)
AST: 13 U/L — ABNORMAL LOW (ref 15–41)
Albumin: 4.1 g/dL (ref 3.5–5.0)
Alkaline Phosphatase: 80 U/L (ref 38–126)
Anion gap: 6 (ref 5–15)
BUN: 16 mg/dL (ref 6–20)
CO2: 18 mmol/L — ABNORMAL LOW (ref 22–32)
Calcium: 9.2 mg/dL (ref 8.9–10.3)
Chloride: 114 mmol/L — ABNORMAL HIGH (ref 98–111)
Creatinine, Ser: 0.77 mg/dL (ref 0.44–1.00)
GFR, Estimated: >60 mL/min (ref >60)
Glucose, Bld: 92 mg/dL (ref 70–99)
Potassium: 3.2 mmol/L — ABNORMAL LOW (ref 3.5–5.1)
Sodium: 138 mmol/L (ref 135–145)
Total Bilirubin: 0.7 mg/dL (ref 0.3–1.2)
Total Protein: 7.6 g/dL (ref 6.5–8.1)

## 2021-11-18 LAB — TROPONIN I (HIGH SENSITIVITY): Troponin I (High Sensitivity): 6 ng/L (ref <18)

## 2021-11-18 MED ORDER — HYDRALAZINE HCL 20 MG/ML IJ SOLN
10.0000 mg | Freq: Once | INTRAMUSCULAR | Status: AC
  Administered 2021-11-18: 10 mg via INTRAVENOUS
  Filled 2021-11-18: qty 1

## 2021-11-18 MED ORDER — DIPHENHYDRAMINE HCL 50 MG/ML IJ SOLN
25.0000 mg | Freq: Once | INTRAMUSCULAR | Status: AC
  Administered 2021-11-18: 25 mg via INTRAVENOUS
  Filled 2021-11-18: qty 1

## 2021-11-18 MED ORDER — METOCLOPRAMIDE HCL 5 MG/ML IJ SOLN
10.0000 mg | Freq: Once | INTRAMUSCULAR | Status: AC
Start: 2021-11-18 — End: 2021-11-18
  Administered 2021-11-18: 10 mg via INTRAVENOUS
  Filled 2021-11-18: qty 2

## 2021-11-18 NOTE — ED Triage Notes (Addendum)
Patient went to [place] today and reports that she has been having right sided body pain x 2 weeks.  ?Patient reports that she has hypertension and is compliant with her meds. Patient also c/o blurred visin that she has had "for a long time." ? ?BP in triage-240/117. ?

## 2021-11-18 NOTE — Discharge Instructions (Signed)
Please take your blood pressure medicines ? ?See your family practice doctor  ? ?Return to ER if you have worse headache, chest pain, trouble breathing. ? ? ?

## 2021-11-18 NOTE — ED Provider Notes (Signed)
?[facility] DEPT ?Provider Note ? ? ?CSN: [id number] ?Arrival date & time: 11/18/21  1737 ? ?  ? ?History ? ?Chief Complaint  ?Patient presents with  ? Hypertension  ? ? ?Megan Baxter is a 51 y.o. female hx of HTN, here with hypertension, headache. Patient states that she has been having headache for several days. She saw Family practice yesterday and apparently has not been able to afford her blood pressure medicines.  She only has several pills of labetalol at home.  She went to her pharmacy today to get her refill but the refills not ready.  She then went to urgent care was sent here because her blood pressure was 240.  Denies any chest pain or shortness of breath ? ?The history is provided by the patient.  ? ?  ? ?Home Medications ?Prior to Admission medications   ?Medication Sig Start Date End Date Taking? Authorizing Provider  ?acetaminophen (TYLENOL) 500 MG tablet Take 2 tablets (1,000 mg total) by mouth every 6 (six) hours. 03/01/20   Lattie Haw, MD  ?acetaZOLAMIDE ER (DIAMOX) 500 MG capsule TAKE 1 CAPSULE(500 MG) BY MOUTH TWICE DAILY 11/12/21   Lattie Haw, MD  ?acetaZOLAMIDE ER (DIAMOX) 500 MG capsule TAKE 1 CAPSULE(500 MG) BY MOUTH TWICE DAILY 11/12/21   Lattie Haw, MD  ?atorvastatin (LIPITOR) 20 MG tablet Take 1 tablet (20 mg total) by mouth daily. 11/12/21   Simmons-Robinson, Riki Sheer, MD  ?diclofenac Sodium (VOLTAREN) 1 % GEL Apply 4 g topically 4 (four) times daily. 03/01/20   Lattie Haw, MD  ?diphenhydrAMINE (BENADRYL) 25 MG tablet Take 1 tablet (25 mg total) by mouth every 6 (six) hours as needed. 05/14/19   Regan Lemming, MD  ?EPINEPHrine 0.3 mg/0.3 mL IJ SOAJ injection Inject 0.3 mLs (0.3 mg total) into the muscle as needed for anaphylaxis. 06/05/19   Fulp, Cammie, MD  ?labetalol (NORMODYNE) 300 MG tablet Take 2 tablets (600 mg total) by mouth 2 (two) times daily. 11/17/21 02/15/22  Lattie Haw, MD  ?losartan (COZAAR) 50 MG tablet Take 1 tablet (50 mg total) by  mouth at bedtime. 11/12/21   Simmons-Robinson, Riki Sheer, MD  ?Vitamin D, Ergocalciferol, (DRISDOL) 1.25 MG (50000 UT) CAPS capsule Take 1 capsule (50,000 Units total) by mouth every 7 (seven) days. 06/10/19   Fulp, Cammie, MD  ?lisinopril-hydrochlorothiazide (ZESTORETIC) 20-12.5 MG tablet Take 2 tablets by mouth daily. 05/01/19 05/14/19  Antony Blackbird, MD  ?   ? ?Allergies    ?Drug class [pork-derived products]   ? ?Review of Systems   ?Review of Systems  ?Neurological:  Positive for headaches.  ?All other systems reviewed and are negative. ? ?Physical Exam ?Updated Vital Signs ?BP (!) 205/90   Pulse 78   Temp 98.8 ?F (37.1 ?C) (Oral)   Resp 17   Ht '5\' 5"'$  (1.651 m)   Wt 74.1 kg   LMP 10/31/2021 (Approximate)   SpO2 99%   BMI 27.20 kg/m?  ?Physical Exam ?Vitals and nursing note reviewed.  ?HENT:  ?   Head: Normocephalic.  ?   Nose: Nose normal.  ?   Mouth/Throat:  ?   Mouth: Mucous membranes are moist.  ?Eyes:  ?   Extraocular Movements: Extraocular movements intact.  ?   Pupils: Pupils are equal, round, and reactive to light.  ?Cardiovascular:  ?   Rate and Rhythm: Normal rate and regular rhythm.  ?   Pulses: Normal pulses.  ?   Heart sounds: Normal heart sounds.  ?Pulmonary:  ?  Effort: Pulmonary effort is normal.  ?   Breath sounds: Normal breath sounds.  ?Abdominal:  ?   General: Abdomen is flat.  ?   Palpations: Abdomen is soft.  ?Musculoskeletal:     ?   General: Normal range of motion.  ?   Cervical back: Normal range of motion and neck supple.  ?Skin: ?   General: Skin is warm.  ?   Capillary Refill: Capillary refill takes less than 2 seconds.  ?Neurological:  ?   General: No focal deficit present.  ?   Mental Status: She is alert and oriented to person, place, and time.  ?   Cranial Nerves: No cranial nerve deficit.  ?   Sensory: No sensory deficit.  ?   Motor: No weakness.  ?   Coordination: Coordination normal.  ?Psychiatric:     ?   Mood and Affect: Mood normal.     ?   Behavior: Behavior normal.   ? ? ?ED Results / Procedures / Treatments   ?Labs ?(all labs ordered are listed, but only abnormal results are displayed) ?Labs Reviewed  ?CBC WITH DIFFERENTIAL/PLATELET - Abnormal; Notable for the following components:  ?    Result Value  ? Hemoglobin 10.8 (*)   ? HCT 33.2 (*)   ? MCV 77.6 (*)   ? MCH 25.2 (*)   ? RDW 16.6 (*)   ? Platelets 450 (*)   ? All other components within normal limits  ?COMPREHENSIVE METABOLIC PANEL - Abnormal; Notable for the following components:  ? Potassium 3.2 (*)   ? Chloride 114 (*)   ? CO2 18 (*)   ? AST 13 (*)   ? All other components within normal limits  ?TROPONIN I (HIGH SENSITIVITY)  ?TROPONIN I (HIGH SENSITIVITY)  ? ? ?EKG ?EKG Interpretation ? ?Date/Time:  Tuesday November 18 2021 17:51:37 EDT ?Ventricular Rate:  65 ?PR Interval:  170 ?QRS Duration: 84 ?QT Interval:  384 ?QTC Calculation: 399 ?R Axis:   10 ?Text Interpretation: Normal sinus rhythm Left ventricular hypertrophy with repolarization abnormality ( R in aVL , Cornell product , Romhilt-Estes ) Abnormal ECG When compared with ECG of 29-Nov-2019 15:52, PREVIOUS ECG IS PRESENT Confirmed by Wandra Arthurs [phone]) on 11/18/2021 6:47:53 PM ? ?Radiology ?CT HEAD WO CONTRAST (5MM) ? ?Result Date: 11/18/2021 ?CLINICAL DATA:  Headache, chronic, new features or increased frequency Patient reports right-sided body pain for 2 weeks. EXAM: CT HEAD WITHOUT CONTRAST TECHNIQUE: Contiguous axial images were obtained from the base of the skull through the vertex without intravenous contrast. RADIATION DOSE REDUCTION: This exam was performed according to the departmental dose-optimization program which includes automated exposure control, adjustment of the mA and/or kV according to patient size and/or use of iterative reconstruction technique. COMPARISON:  Head CT 10/17/2013 FINDINGS: Brain: No intracranial hemorrhage, mass effect, or midline shift. Minor deep white matter hypodensity typical of chronic small vessel ischemia, stable from  prior. No hydrocephalus. The basilar cisterns are patent. No evidence of territorial infarct or acute ischemia. No extra-axial or intracranial fluid collection. Vascular: No hyperdense vessel or unexpected calcification. Skull: No fracture or focal lesion. Sinuses/Orbits: No acute finding. Tiny mucous retention cyst left maxillary sinus. Other: None. IMPRESSION: No acute intracranial abnormality. Electronically Signed   By: Keith Rake M.D.   On: 11/18/2021 19:13   ? ?Procedures ?Procedures  ? ? ?Medications Ordered in ED ?Medications  ?hydrALAZINE (APRESOLINE) injection 10 mg (10 mg Intravenous Given 11/18/21 1920)  ?metoCLOPramide (REGLAN) injection 10 mg (  10 mg Intravenous Given 11/18/21 1920)  ?diphenhydrAMINE (BENADRYL) injection 25 mg (25 mg Intravenous Given 11/18/21 1919)  ? ? ?ED Course/ Medical Decision Making/ A&P ?  ?                        ?Medical Decision Making ?Megan Baxter is a 52 y.o. female here presenting with headache and hypertension. Patient was hypertensive yesterday at PCP office.  Patient's blood pressure was about 240 on arrival.  Patient is on multiple BP meds but has not filled her prescription.  We will get CT head and labs.  We will give migraine cocktail. ? ?8:51 PM ?CT head unremarkable.  Blood pressure down to 200 from 240.  Stable for discharge.  Told her to fill her prescription ? ? ?Problems Addressed: ?Hypertension, unspecified type: acute illness or injury ? ?Amount and/or Complexity of Data Reviewed ?Labs: ordered. Decision-making details documented in ED Course. ?Radiology: ordered and independent interpretation performed. Decision-making details documented in ED Course. ? ?Risk ?Prescription drug management. ? ?Final Clinical Impression(s) / ED Diagnoses ?Final diagnoses:  ?None  ? ? ?Rx / DC Orders ?ED Discharge Orders   ? ? None  ? ?  ? ? ?  ?Drenda Freeze, MD ?11/18/21 2054 ? ?

## 2021-11-18 NOTE — Assessment & Plan Note (Signed)
Uncontrolled HTN. Initial 203/87, on recheck BP 148/108. Pt reports difficulty affording medications at her current pharmacy and she only had a few pills of her labetalol. I have sent labetalol to [facility] which should be cheaper for pt as well as a BP cuff. Recommends she checks her BP at home twice a day this week. Follow up with me in 3 days for BP recheck. I will aim to wean her off the labetalol as this is not a usual anti-hypertensive for HTN (it was started in her pregnancy) and start her on amlodipine. Due to lab closure today unable to obtain BMP. Will check BMP at next visit in 3 days. Strict ER precautions given to pt.   ?

## 2021-11-18 NOTE — ED Notes (Signed)
Discharge instructions discussed with pt. Pt verbalized understanding with no questions at this time. Pt to go home with family at bedside 

## 2021-11-19 ENCOUNTER — Ambulatory Visit: Payer: Self-pay | Admitting: Family Medicine

## 2021-11-21 ENCOUNTER — Encounter: Payer: Self-pay | Admitting: Family Medicine

## 2021-11-21 ENCOUNTER — Ambulatory Visit (INDEPENDENT_AMBULATORY_CARE_PROVIDER_SITE_OTHER): Payer: Self-pay | Admitting: Family Medicine

## 2021-11-21 VITALS — BP 221/99 | HR 85 | Ht 65.0 in | Wt 164.5 lb

## 2021-11-21 DIAGNOSIS — I10 Essential (primary) hypertension: Secondary | ICD-10-CM

## 2021-11-21 MED ORDER — LOSARTAN POTASSIUM 100 MG PO TABS: 100.0000 mg | ORAL_TABLET | Freq: Every day | ORAL | 3 refills | Status: DC

## 2021-11-21 MED ORDER — AMLODIPINE BESYLATE 5 MG PO TABS: 5.0000 mg | ORAL_TABLET | Freq: Every day | ORAL | 3 refills | Status: DC

## 2021-11-21 NOTE — Progress Notes (Signed)
? ? ? ?  SUBJECTIVE:  ? ?CHIEF COMPLAINT / HPI:  ? ?Megan Baxter is a 51 y.o. female presents for HTN ? ?Accompanied by her daughter. She interpreted for pt. They declined an interpretor. ? ?Hypertension ?Patient's current antihypertensive  medications include: losartan, labetalol and acetazolamide. Compliant with medications and tolerating well without side effects.  Did not pick up the BP cuff from the pharmacy. But has been checking her Bp at work. Values> 656 systolic consistently. Pt was seen in the ED 2 days ago for severe HTN and headache. Denies any SOB, CP, vision changes, LE edema, medication SEs, or symptoms of hypotension.  ? ?Most recent creatinine trend:  ?Lab Results  ?Component Value Date  ? CREATININE 0.83 11/21/2021  ? CREATININE 0.77 11/18/2021  ? CREATININE 0.94 08/16/2020  ?  ? ?Patient has had a BMP in the past 1 year. ? ?[place] [facility] Office Visit from 11/21/2021 in [place]  ?PHQ-9 Total Score 1  ? ?  ?  ?PERTINENT  PMH / PSH: HTN, pseudotumor cerebri ? ?OBJECTIVE:  ? ?BP (!) 221/99   Pulse 85   Ht '5\' 5"'$  (1.651 m)   Wt 164 lb 8 oz (74.6 kg)   LMP 10/31/2021 (Approximate)   SpO2 99%   BMI 27.37 kg/m?   ? ?BP 221/99 ? ?General: Alert, no acute distress ?Cardio: well perfused  ?Pulm: normal work of breathing ?Neuro: Cranial nerves grossly intact  ? ?ASSESSMENT/PLAN:  ? ?Severe hypertension ?Severe hypertension today (hypertensive urgency). BP rechecked twice and persistently above 812 systolic.  It is reassuring that patient is asymptomatic, she denies headaches, vision changes, altered mental status, chest pain etc. Aditionally reassuring that patient was seen in the ED a few days ago with troponin, CMP,  CT head within normal limits and which was normal.  Recommended that she starts amlodipine 5 mg and increases her losartan to 100 mg today, I have sent this to the pharmacy.  After blood pressure is well controlled I will wean her off the labetalol and consider  switching her to carvedilol which has a stronger antihypertensive property compared to labetalol.  Reiterated that patient should keep a blood pressure diary at home over the weekend after starting the additional medications and she should bring them with her to the next visit.  Strict ER precautions given to patient. She has a follow-up with Dr. Larae Grooms on 1st May to follow-up on her blood pressure ?.  ?  ? ?Lattie Haw, MD PGY-3 ?[place]   ?

## 2021-11-21 NOTE — Patient Instructions (Addendum)
Merci d'?tre venu me voir aujourd'hui. Ce fut un plaisir. Aujourd'hui, nous avons discut? de votre tension art?rielle, elle est toujours tr?s ?LEV?E. ?Je recommande: ?*COMMENCER LOSARTAN '100MG'$  ?*COMMENCER AMLODIPINE '5MG'$  ? ?Rendez-vous aux urgences si votre tension art?rielle est constamment sup?rieure ? 200/110 ou si vous avez des maux de t?te, des douleurs ? la poitrine, Social research officer, government. ? ?Veuillez nous contacter dans 3 jours pour v?rifier la tension art?rielle ? ?Si vous avez des questions ou des pr?occupations, n'h?sitez pas ? appeler [employer] [phone]. ? ?Meilleurs v?ux, ? ?Docteur Kristy Schomburg ? ? ? ? ? ? ? ? ? ?Thank you for coming to see me today. It was a pleasure. Today we discussed your blood pressure it is still very HIGH.  ?I recommend: ?*START LOSARTAN '100MG'$  ?*START AMLODIPINE '5MG'$  ? ?Got to the ED if your blood pressure is consistently above 200/110 or if you have headaches, chest pain etc.  ? ?Please follow-up with  [place] in 3 days for blood pressure check ? ?If you have any questions or concerns, please do not hesitate to call the office at [phone]. ? ?Best wishes,  ? ?Dr Posey Pronto   ?

## 2021-11-22 DIAGNOSIS — I10 Essential (primary) hypertension: Secondary | ICD-10-CM | POA: Insufficient documentation

## 2021-11-22 LAB — BASIC METABOLIC PANEL
BUN/Creatinine Ratio: 13 (ref 9–23)
BUN: 11 mg/dL (ref 6–24)
CO2: 18 mmol/L — ABNORMAL LOW (ref 20–29)
Calcium: 9.7 mg/dL (ref 8.7–10.2)
Chloride: 111 mmol/L — ABNORMAL HIGH (ref 96–106)
Creatinine, Ser: 0.83 mg/dL (ref 0.57–1.00)
Glucose: 99 mg/dL (ref 70–99)
Potassium: 3.8 mmol/L (ref 3.5–5.2)
Sodium: 140 mmol/L (ref 134–144)
eGFR: 86 mL/min/{1.73_m2} (ref >59)

## 2021-11-22 NOTE — Assessment & Plan Note (Addendum)
Severe hypertension today (hypertensive urgency). BP rechecked twice and persistently above 035 systolic.  It is reassuring that patient is asymptomatic, she denies headaches, vision changes, altered mental status, chest pain etc. Aditionally reassuring that patient was seen in the ED a few days ago with troponin, CMP,  CT head within normal limits and which was normal.  Recommended that she starts amlodipine 5 mg and increases her losartan to 100 mg today, I have sent this to the pharmacy.  After blood pressure is well controlled I will wean her off the labetalol and consider switching her to carvedilol which has a stronger antihypertensive property compared to labetalol.  Reiterated that patient should keep a blood pressure diary at home over the weekend after starting the additional medications and she should bring them with her to the next visit.  Strict ER precautions given to patient. She has a follow-up with Dr. Larae Grooms on 1st May to follow-up on her blood pressure ?Marland Kitchen  ?

## 2021-11-24 ENCOUNTER — Encounter: Payer: Self-pay | Admitting: Family Medicine

## 2021-11-24 ENCOUNTER — Ambulatory Visit (INDEPENDENT_AMBULATORY_CARE_PROVIDER_SITE_OTHER): Payer: Self-pay | Admitting: Family Medicine

## 2021-11-24 VITALS — BP 162/92 | HR 74 | Wt 160.0 lb

## 2021-11-24 DIAGNOSIS — I10 Essential (primary) hypertension: Secondary | ICD-10-CM

## 2021-11-24 MED ORDER — AMLODIPINE BESYLATE 10 MG PO TABS: 10.0000 mg | ORAL_TABLET | Freq: Every day | ORAL | 3 refills | Status: DC

## 2021-11-24 NOTE — Assessment & Plan Note (Signed)
-  BP 174/99, on repeat 162/92, improved ?-increased amlodipine to 10 mg  ?-continue losartan 100 mg ?-pending BMP given increase in losartan at last visit ?-maintain record of BP and bring to next visit ?-instructed to bring all meds to next visit ?-follow up in 1 week for BP recheck  ?

## 2021-11-24 NOTE — Patient Instructions (Addendum)
C'?tait super de vous voir aujourd'hui ! ? ?Aujourd'hui, nous avons discut? de votre tension art?rielle, elle s'est am?lior?e mais n'a toujours pas atteint l'objectif que nous souhaitons. Je vais augmenter votre amlodipine de 5 mg ? 10 mg. Veuillez prendre de l'amlodipine 10 mg par jour et du losartan 100 mg par [employer]. Continuez ? v?rifier votre tension art?rielle quotidiennement et enregistrez-la, Surveyor, minerals ce dossier lors de votre prochaine visite. ? ?Nous prendrons ?galement la tension art?rielle pour nous assurer que vos ?lectrolytes sont normaux, je vous informerai de tout r?sultat anormal. ? ?Apportez tous vos m?dicaments lors de votre prochaine visite. ? ?Veuillez faire un suivi lors de votre prochain rendez-vous pr?vu dans 1 semaine, si quelque chose survient d'ici l?, n'h?sitez pas ? Tree surgeon. ? ? ?Merci de nous permettre de faire partie de vos soins m?dicaux! ? ?Merci, ?Dr Larae Grooms ? ?It was great seeing you today! ? ?Today we discussed your blood pressure, it has improved but still not at the goal that we want. I will increase your amlodipine from 5 mg to 10 mg. Please take amlodipine 10 mg daily and losartan 100 mg daily. Continue to check your blood pressure daily and record it, please bring in this record to your next visit.  ? ?We will also get blood pressure to make sure your electrolytes are normal, I will notify you of any abnormal results.  ? ?Bring all your medications to your next visit.  ? ?Please follow up at your next scheduled appointment in 1 week, if anything arises between now and then, please don't hesitate to contact our office. ? ? ?Thank you for allowing [place] to be a part of your medical care! ? ?Thank you, ?Dr. Larae Grooms  ?

## 2021-11-24 NOTE — Progress Notes (Signed)
    SUBJECTIVE:   CHIEF COMPLAINT / HPI:   Patient presents for blood pressure follow up. Seen last week both in the clinic and ED with systolic BP as high as 846N. Instructed to take amlodipine 5 mg and losartan 100 mg daily. Endorses compliance. Denies chest pain, dyspnea, vision changes, weakness and leg swelling. She is accompanied by her daughter, she declines an interpretor.  Home blood pressures recorded at home but did not bring in the record. Daughter says that this morning BP 181/90, this seems to be the lowest checked BP at home.   OBJECTIVE:   BP (!) 162/92   Pulse 74   Wt 160 lb (72.6 kg)   LMP 10/31/2021 (Approximate)   SpO2 99%   BMI 26.63 kg/m   General: Patient well-appearing, in no acute distress. CV: RRR, no murmurs or gallops  Resp: CTAB, no wheezing, rales or rhonchi noted GI: soft, nontender, nondistended, presence of bowel sounds Ext: no LE edema noted bilaterally Psych: mood appropriate, very pleasant   ASSESSMENT/PLAN:   Essential hypertension -BP 174/99, on repeat 162/92, improved -increased amlodipine to 10 mg  -continue losartan 100 mg -pending BMP given increase in losartan at last visit -maintain record of BP and bring to next visit -instructed to bring all meds to next visit -follow up in 1 week for BP recheck    -PHQ-9 score of 1 with negative question 9 reviewed.   Donney Dice, [place]

## 2021-11-25 LAB — BASIC METABOLIC PANEL
BUN/Creatinine Ratio: 14 (ref 9–23)
BUN: 12 mg/dL (ref 6–24)
CO2: 18 mmol/L — ABNORMAL LOW (ref 20–29)
Calcium: 9.7 mg/dL (ref 8.7–10.2)
Chloride: 107 mmol/L — ABNORMAL HIGH (ref 96–106)
Creatinine, Ser: 0.83 mg/dL (ref 0.57–1.00)
Glucose: 124 mg/dL — ABNORMAL HIGH (ref 70–99)
Potassium: 3.4 mmol/L — ABNORMAL LOW (ref 3.5–5.2)
Sodium: 136 mmol/L (ref 134–144)
eGFR: 86 mL/min/{1.73_m2} (ref >59)

## 2021-11-26 ENCOUNTER — Other Ambulatory Visit: Payer: Self-pay | Admitting: Family Medicine

## 2021-11-26 DIAGNOSIS — E876 Hypokalemia: Secondary | ICD-10-CM

## 2021-11-26 MED ORDER — POTASSIUM CHLORIDE CRYS ER 20 MEQ PO TBCR: 40.0000 meq | EXTENDED_RELEASE_TABLET | Freq: Two times a day (BID) | ORAL | 0 refills | Status: DC

## 2021-12-01 ENCOUNTER — Ambulatory Visit (INDEPENDENT_AMBULATORY_CARE_PROVIDER_SITE_OTHER): Payer: Self-pay | Admitting: Family Medicine

## 2021-12-01 VITALS — BP 188/106 | HR 109 | Wt 160.0 lb

## 2021-12-01 DIAGNOSIS — I10 Essential (primary) hypertension: Secondary | ICD-10-CM

## 2021-12-01 MED ORDER — OLMESARTAN MEDOXOMIL-HCTZ 40-25 MG PO TABS: 1.0000 | ORAL_TABLET | Freq: Every day | ORAL | 1 refills | Status: DC

## 2021-12-01 NOTE — Patient Instructions (Addendum)
It was great seeing you today! ? ?Today we discussed your blood pressure, it still remains to be very high which I am concerned about. I have started a new medication called olmesartan-hydrochlorothiazide. Please take this every morning daily. Please continue to also take amlodipine 10 mg. Please STOP taking losartan.  ? ?Please record your blood pressures daily and bring this to your next visit. Please also bring all your medications to all your upcoming visits.  ? ?If you notice a blood pressure with the top number greater than 160 along with chest pain, shortness of breath, headaches, vision changes or weakness then please go to the emergency department.  ? ?Please follow up at your next scheduled appointment in 1 week, if anything arises between now and then, please don't hesitate to contact our office. ? ? ?Thank you for allowing [place] to be a part of your medical care! ? ?Thank you, ?Dr. Larae Grooms  ? ?C'?tait super de vous voir aujourd'hui ! ? ?Aujourd'hui, nous avons discut? de votre tension art?rielle, elle reste encore tr?s ?lev?e, ce qui m'inqui?te. J'ai commenc? un nouveau m?dicament appel? olm?sartan-hydrochlorothiazide. Veuillez le prendre tous les matins tous les jours. Veuillez continuer ? prendre ?galement de l'amlodipine 10 mg. Althea Grimmer ARR?TER de prendre du losartan. ? ?Veuillez noter quotidiennement votre tension art?rielle et Museum/gallery exhibitions officer de votre prochaine visite. Veuillez ?galement apporter tous vos m?dicaments ? toutes vos visites ? venir. ? ?Si vous remarquez une tension art?rielle Catering manager sup?rieur est sup?rieur ? 160 ainsi que des douleurs thoraciques, un essoufflement, des maux de t?te, Water quality scientist de vision ou une faiblesse, Psychologist, counselling vous rendre [employer]. ? ?Veuillez faire un suivi lors de votre prochain rendez-vous pr?vu dans 1 semaine, si quelque chose survient d'ici l?, n'h?sitez pas ? Tree surgeon. ? ? ?Merci de nous permettre de faire partie de vos  soins m?dicaux! ? ?Merci, ?Dr Larae Grooms ?

## 2021-12-01 NOTE — Progress Notes (Signed)
? ? ?  SUBJECTIVE:  ? ?CHIEF COMPLAINT / HPI:  ? ?Patient presents with daughter for blood pressure check. Compliant on amlodipine and losartan daily as prescribed. Over the week, her blood pressures are 811-572I systolic. Denies chest pain, dyspnea, vision changes, leg swelling, palpitations, headaches and weakness.  ? ?OBJECTIVE:  ? ?BP (!) 188/106   Pulse (!) 109   Wt 160 lb (72.6 kg)   SpO2 100%   BMI 26.63 kg/m?   ?General: Patient well-appearing, in no acute distress. ?CV: RRR, no murmurs or gallops auscultated ?Resp: CTAB, no wheezing, rales or rhonchi noted ?Ext: radial and distal pulses strong and equal bilaterally, no LE edema bilaterally  ? ?ASSESSMENT/PLAN:  ? ?Essential hypertension ?-BP 204/119, on repeat 188/106  ?-discontinued losartan, started olmesartan-HCTZ 40/25 mg daily  ?-continue amlodipine 10 mg  ?-instructed to bring all meds to all visits ?-instructed to check BP daily and bring record to next visit ?-strict ED precautions provided  ?-follow up in 1 week for BP check, repeat BMP at this time ?  ?Patient declining interpretor, daughter serving as interpretor per patient request.  ? ?Donney Dice, DO ?[place]  ?

## 2021-12-01 NOTE — Assessment & Plan Note (Signed)
-  BP 204/119, on repeat 188/106  ?-discontinued losartan, started olmesartan-HCTZ 40/25 mg daily  ?-continue amlodipine 10 mg  ?-instructed to bring all meds to all visits ?-instructed to check BP daily and bring record to next visit ?-strict ED precautions provided  ?-follow up in 1 week for BP check, repeat BMP at this time ?

## 2021-12-08 ENCOUNTER — Ambulatory Visit (INDEPENDENT_AMBULATORY_CARE_PROVIDER_SITE_OTHER): Payer: Medicaid Other | Admitting: Family Medicine

## 2021-12-08 ENCOUNTER — Encounter: Payer: Self-pay | Admitting: Family Medicine

## 2021-12-08 VITALS — BP 106/72 | HR 81 | Ht 65.0 in | Wt 156.8 lb

## 2021-12-08 DIAGNOSIS — I10 Essential (primary) hypertension: Secondary | ICD-10-CM | POA: Diagnosis not present

## 2021-12-08 NOTE — Patient Instructions (Addendum)
Merci d'?tre venu me voir aujourd'hui. Ce fut un plaisir. Aujourd'hui, nous avons discut? de votre tension art?rielle, elle est bien meilleure aujourd'hui. Je recommande de continuer les m?dicaments actuels ? ?V?rification de la fonction r?nale aujourd'hui ? ?Veuillez me suivre dans 4 ? 6 semaines ? ?Si vous avez des questions ou des pr?occupations, n'h?sitez pas ? appeler [employer] [phone]. ? ?Meilleurs v?ux, ? ?Docteur Kerissa Coia ? ? ?Thank you for coming to see me today. It was a pleasure. Today we discussed your blood pressure, it is much better today. I recommend continuing current medications ? ?Checking kidney function today  ? ?Please follow-up with me in 4-6 weeks  ? ?If you have any questions or concerns, please do not hesitate to call the office at [phone]. ? ?Best wishes,  ? ?Dr Posey Pronto   ?

## 2021-12-08 NOTE — Assessment & Plan Note (Addendum)
BP 106/72, much improved. Asymptomatic. Continue olmesartan-hctz 40-'25mg'$ , amlodipine '10mg'$ . Follow up in 4-6 weeks. Obtained BMP today. ?

## 2021-12-08 NOTE — Progress Notes (Signed)
? ? ? ?  SUBJECTIVE:  ? ?CHIEF COMPLAINT / HPI:  ? ?Megan Baxter is a 51 y.o. female presents for HTN  ? ? ?Hypertension: ?Patient's current antihypertensive  medications include: olmesartan-hctz 40-'25mg'$ , amlodipine '10mg'$ .   Compliant with medications and tolerating well without side effects.  Checking BP at home with readings between 312-8118A systolic at home. Denies any SOB, CP, vision changes, LE edema, medication SEs, or symptoms of hypotension.  ? ?Most recent creatinine trend:  ?Lab Results  ?Component Value Date  ? CREATININE 0.83 11/24/2021  ? CREATININE 0.83 11/21/2021  ? CREATININE 0.77 11/18/2021  ?  ? ?Patient has had a BMP in the past 1 year. ? ? ?[place] Office Visit from 12/08/2021 in [facility]  ?PHQ-9 Total Score 1  ? ?  ? ? ?PERTINENT  PMH / PSH: HTN, HLD  ? ?OBJECTIVE:  ? ?BP 106/72   Pulse 81   Ht '5\' 5"'$  (1.651 m)   Wt 156 lb 12.8 oz (71.1 kg)   SpO2 100%   BMI 26.09 kg/m?   ? ?General: Alert, no acute distress ?Cardio: well perfused  ?Pulm: normal work of breathing ?Neuro: Cranial nerves grossly intact  ? ?ASSESSMENT/PLAN:  ? ?Essential hypertension ?BP 106/72, much improved. Asymptomatic. Continue olmesartan-hctz 40-'25mg'$ , amlodipine '10mg'$ . Follow up in 4-6 weeks. Obtained BMP today. ?  ? ?Lattie Haw, MD PGY-3 ?[place]  ?

## 2021-12-09 LAB — BASIC METABOLIC PANEL
BUN/Creatinine Ratio: 19 (ref 9–23)
BUN: 24 mg/dL (ref 6–24)
CO2: 21 mmol/L (ref 20–29)
Calcium: 9.1 mg/dL (ref 8.7–10.2)
Chloride: 98 mmol/L (ref 96–106)
Creatinine, Ser: 1.29 mg/dL — ABNORMAL HIGH (ref 0.57–1.00)
Glucose: 145 mg/dL — ABNORMAL HIGH (ref 70–99)
Potassium: 3.4 mmol/L — ABNORMAL LOW (ref 3.5–5.2)
Sodium: 134 mmol/L (ref 134–144)
eGFR: 51 mL/min/{1.73_m2} — ABNORMAL LOW (ref >59)

## 2021-12-11 ENCOUNTER — Telehealth: Payer: Self-pay | Admitting: Family Medicine

## 2021-12-11 ENCOUNTER — Other Ambulatory Visit: Payer: Medicaid Other

## 2021-12-11 ENCOUNTER — Other Ambulatory Visit: Payer: Self-pay | Admitting: Family Medicine

## 2021-12-11 DIAGNOSIS — I10 Essential (primary) hypertension: Secondary | ICD-10-CM

## 2021-12-11 NOTE — Telephone Encounter (Signed)
Called pt and LV with pacific interpretor for repeat BMP. Will try again later.

## 2021-12-11 NOTE — Telephone Encounter (Signed)
Called pt using [place] interpreters.  Discussed recent BMP results and that her creatinine has increased due to recent antihypertensive medication (olmesartan-hctz) she started. Recommended repeat BMP tomorrow if creatinine is still elevated we will need to reduce the dose or stop the medication.  I will call her tomorrow or over the weekend with the results.  Patient expressed understanding and is happy with the plan.

## 2021-12-12 ENCOUNTER — Telehealth: Payer: Self-pay | Admitting: Family Medicine

## 2021-12-12 ENCOUNTER — Other Ambulatory Visit: Payer: Self-pay | Admitting: Family Medicine

## 2021-12-12 DIAGNOSIS — E876 Hypokalemia: Secondary | ICD-10-CM

## 2021-12-12 LAB — BASIC METABOLIC PANEL
BUN/Creatinine Ratio: 15 (ref 9–23)
BUN: 16 mg/dL (ref 6–24)
CO2: 21 mmol/L (ref 20–29)
Calcium: 9.4 mg/dL (ref 8.7–10.2)
Chloride: 98 mmol/L (ref 96–106)
Creatinine, Ser: 1.05 mg/dL — ABNORMAL HIGH (ref 0.57–1.00)
Glucose: 130 mg/dL — ABNORMAL HIGH (ref 70–99)
Potassium: 2.9 mmol/L — ABNORMAL LOW (ref 3.5–5.2)
Sodium: 133 mmol/L — ABNORMAL LOW (ref 134–144)
eGFR: 65 mL/min/{1.73_m2} (ref >59)

## 2021-12-12 MED ORDER — POTASSIUM CHLORIDE CRYS ER 20 MEQ PO TBCR: 40.0000 meq | EXTENDED_RELEASE_TABLET | Freq: Two times a day (BID) | ORAL | 0 refills | Status: DC

## 2021-12-12 MED ORDER — OLMESARTAN MEDOXOMIL 40 MG PO TABS: 40.0000 mg | ORAL_TABLET | Freq: Every day | ORAL | 0 refills | Status: DC

## 2021-12-12 NOTE — Telephone Encounter (Signed)
Called pt using pacific interpretors-french language. Explained that her potassium is low but her overall creatinine has improved compared to lab test 4 days ago. Recommended she stops benicar-hctz and starts benicar only in addition to 3 days of 61mq Kcl which I have sent to the pharmacy. Explained given her hx of severe HTN we need to monitor her BP closely and see her in clinic next week. Emphasized the importance of good BP control to prevent stroke/CVD etc. Recommended f/u in [facility] at 3:50pm on Wednesday 24th May. Strict ER precautions given to pt who expressed understanding and was grateful for the call.
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
## 2021-12-17 ENCOUNTER — Ambulatory Visit: Payer: Self-pay | Admitting: Student

## 2021-12-17 ENCOUNTER — Other Ambulatory Visit: Payer: Self-pay

## 2021-12-17 ENCOUNTER — Ambulatory Visit (INDEPENDENT_AMBULATORY_CARE_PROVIDER_SITE_OTHER): Payer: Medicaid Other | Admitting: Family Medicine

## 2021-12-17 VITALS — BP 132/68 | HR 89 | Wt 163.0 lb

## 2021-12-17 DIAGNOSIS — Z659 Problem related to unspecified psychosocial circumstances: Secondary | ICD-10-CM

## 2021-12-17 DIAGNOSIS — F4329 Adjustment disorder with other symptoms: Secondary | ICD-10-CM | POA: Diagnosis not present

## 2021-12-17 DIAGNOSIS — I10 Essential (primary) hypertension: Secondary | ICD-10-CM

## 2021-12-17 NOTE — Patient Instructions (Addendum)
It was wonderful to see you today.  Please bring ALL of your medications with you to every visit.   Today we talked about:  -Continuez vos mdicaments tels que prescrits. -Notez votre tension artrielle et apportez-les  vos rendez-vous de suivi. -Si vos lectures sont >140/90 rgulirement, revenez nous voir.  -Nous effectuons des travaux de laboratoire aujourd'hui pour vrifier vos reins et vos lectrolytes. Je vous ferai savoir si les rsultats sont anormaux.  -Nous avons plac une rfrence  notre quipe de travailleurs sociaux qui vous appelleront pour vous mettre en contact [employer].  -Continue your medications as prescribed. -Write down your blood pressures and bring them to your follow up appointments. -If your readings are >140/90 consistently, please come back and see [place].  -We are doing lab work today to check your kidney and electrolytes. I will let you know if the results are abnormal.  -We placed a referral to our team of social workers who will call to connect you with resources.   Thank you for choosing [facility].   Please call [phone] with any questions about today's appointment.  Please be sure to schedule follow up at the front  desk before you leave today.   Sharion Settler, DO PGY-2 Family Medicine
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
## 2021-12-18 ENCOUNTER — Encounter: Payer: Self-pay | Admitting: Family Medicine

## 2021-12-18 LAB — BASIC METABOLIC PANEL
BUN/Creatinine Ratio: 13 (ref 9–23)
BUN: 9 mg/dL (ref 6–24)
CO2: 17 mmol/L — ABNORMAL LOW (ref 20–29)
Calcium: 9.3 mg/dL (ref 8.7–10.2)
Chloride: 110 mmol/L — ABNORMAL HIGH (ref 96–106)
Creatinine, Ser: 0.7 mg/dL (ref 0.57–1.00)
Glucose: 157 mg/dL — ABNORMAL HIGH (ref 70–99)
Potassium: 3.5 mmol/L (ref 3.5–5.2)
Sodium: 139 mmol/L (ref 134–144)
eGFR: 106 mL/min/{1.73_m2} (ref >59)

## 2021-12-26 ENCOUNTER — Other Ambulatory Visit: Payer: Self-pay
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
## 2021-12-26 NOTE — Patient Instructions (Signed)
Visit Information  Megan Baxter was given information about Medicaid Managed Care team care coordination services as a part of their [place] Medicaid benefit. Megan Baxter verbally consented to engagement with the [facility] Managed Care team.   If you are experiencing a medical emergency, please call 911 or report to your local emergency department or urgent care.   If you have a non-emergency medical problem during routine business hours, please contact your provider's office and ask to speak with a nurse.   For questions related to your [facility], please call: [phone] or visit the homepage here: [email or web address]  If you would like to schedule transportation through your [facility], please call the following number at least 2 days in advance of your appointment: [phone].  Rides for urgent appointments can also be made after hours by calling Member Services.  Call the [place] at [phone], at any time, 24 hours a day, 7 days a week. If you are in danger or need immediate medical attention call 911.  If you would like help to quit smoking, call 1-800-QUIT-NOW 937-503-9076) OR Espaol: 1-855-Djelo-Ya (6-384-536-4680) o para ms informacin haga clic aqu or Text READY to 200-400 to register via text  Megan Baxter - following are the goals we discussed in your visit today:   Goals Addressed   None       Social Worker will follow up in 14 days .   Megan Baxter, BSW, [place] Managed Medicaid Team  [phone]   Following is a copy of your plan of care:  There are no care plans that you recently modified to display for this patient.

## 2022-01-15 ENCOUNTER — Other Ambulatory Visit: Payer: Self-pay

## 2022-01-15 NOTE — Patient Outreach (Signed)
Care Coordination  01/15/2022  [name] Jun 14, 1971 [id number]   Medicaid Managed Care   Unsuccessful Outreach Note  01/15/2022 Name: Megan Baxter MRN: [id number] DOB: 09-21-1970  Referred by: Lattie Haw, MD Reason for referral : High Risk Managed Medicaid (MM Social work unsuccessful telephone outreach)   An unsuccessful telephone outreach was attempted today. The patient was referred to the case management team for assistance with care management and care coordination.   Follow Up Plan: A HIPAA compliant phone message was left for the patient providing contact information and requesting a return call.   Mickel Fuchs, BSW, [place] Managed Medicaid Team  [phone]

## 2022-01-15 NOTE — Patient Instructions (Signed)
Visit Information  Ms. Megan Baxter  - as a part of your Medicaid benefit, you are eligible for care management and care coordination services at no cost or copay. I was unable to reach you by phone today but would be happy to help you with your health related needs. Please feel free to call me @ [phone]).     Mickel Fuchs, BSW, [place] Managed Medicaid Team  [phone]

## 2022-01-21 ENCOUNTER — Encounter: Payer: Self-pay | Admitting: Family Medicine

## 2022-01-21 ENCOUNTER — Ambulatory Visit (INDEPENDENT_AMBULATORY_CARE_PROVIDER_SITE_OTHER): Payer: Medicaid Other | Admitting: Family Medicine

## 2022-01-21 DIAGNOSIS — Z59811 Housing instability, housed, with risk of homelessness: Secondary | ICD-10-CM | POA: Diagnosis not present

## 2022-01-21 DIAGNOSIS — Z5941 Food insecurity: Secondary | ICD-10-CM

## 2022-01-21 DIAGNOSIS — E785 Hyperlipidemia, unspecified: Secondary | ICD-10-CM | POA: Diagnosis not present

## 2022-01-21 DIAGNOSIS — I10 Essential (primary) hypertension: Secondary | ICD-10-CM

## 2022-01-21 MED ORDER — ATORVASTATIN CALCIUM 20 MG PO TABS: 20.0000 mg | ORAL_TABLET | Freq: Every day | ORAL | 0 refills | Status: DC

## 2022-01-21 MED ORDER — OLMESARTAN MEDOXOMIL 40 MG PO TABS: 40.0000 mg | ORAL_TABLET | Freq: Every day | ORAL | 0 refills | Status: DC

## 2022-01-21 NOTE — Patient Instructions (Signed)
Thank you for coming to see me today. It was a pleasure. Today we discussed your blood pressure, it is a little high today. I have sent refills for your medications  Please follow-up with me as needed  If you have any questions or concerns, please do not hesitate to call the office at [phone].  Best wishes,   Dr Posey Pronto
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
## 2022-01-22 DIAGNOSIS — Z59811 Housing instability, housed, with risk of homelessness: Secondary | ICD-10-CM | POA: Insufficient documentation

## 2022-01-22 DIAGNOSIS — Z5941 Food insecurity: Secondary | ICD-10-CM | POA: Insufficient documentation

## 2022-01-22 DIAGNOSIS — E785 Hyperlipidemia, unspecified: Secondary | ICD-10-CM | POA: Insufficient documentation
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
## 2022-01-22 NOTE — Assessment & Plan Note (Addendum)
Initial BP elevated to 155/98, on recheck 142/82. Slightly elevated in the setting of being out of her Benicar for the last week. Refilled Benicar. Continue Amlodipine '10mg'$  and Benicar '40mg'$ . Of note pt should no longer be taking acetazolamide. This has been discontinued. Follow up as needed.

## 2022-02-22 ENCOUNTER — Other Ambulatory Visit: Payer: Self-pay | Admitting: Family Medicine

## 2022-04-10 NOTE — Progress Notes (Unsigned)
  SUBJECTIVE:   CHIEF COMPLAINT / HPI:   Hypertension: BP: (!) 161/97 today. Home medications include: amlodipine '10mg'$  daily, benicar '40mg'$  daily. She endorses taking these medications as prescribed. Does not check blood pressure at home. Patient has had a BMP in the past 1 year.   PERTINENT  PMH / PSH: HTN, HLD  OBJECTIVE:  BP (!) 161/97   Pulse 88   Ht '5\' 5"'$  (1.651 m)   Wt 165 lb 6.4 oz (75 kg)   LMP 04/12/2022 (Exact Date)   SpO2 100%   BMI 27.52 kg/m   General: NAD, pleasant, able to participate in exam Cardiac: RRR, 3/6 holosystolic murmur at LUSB Respiratory: CTAB, normal WOB Psych: Normal affect and mood  ASSESSMENT/PLAN:  Essential hypertension Assessment & Plan: BP: (!) 161/97 today. Poorly controlled. Goal of <130/80. Follow up in 1 month.  Medication regimen: Amlodipine 10 mg daily, olmesartan 40 mg daily, start HCTZ 12.5 mg daily  Orders: -     hydroCHLOROthiazide; Take 1 tablet (12.5 mg total) by mouth daily.  Dispense: 30 tablet; Refill: 3  Hyperglycemia Assessment & Plan: Hyperglycemic up to 150s on most recent BMPs.  Concern for prediabetes or diabetes.  Check A1c today.  Orders: -     POCT glycosylated hemoglobin (Hb A1C)  Mixed hyperlipidemia Assessment & Plan: Currently taking Lipitor 20 mg daily.  Recheck lipid panel today as this has not been checked for 3 years.  Orders: -     Lipid panel  Murmur, cardiac Assessment & Plan: Newly presenting systolic murmur, not radiating to neck.  Orders: -     ECHOCARDIOGRAM COMPLETE; Future   Return in about 1 month (around 05/13/2022) for HTN follow-up. [name], DO 04/13/2022, 3:26 PM PGY-2, [place]

## 2022-04-13 ENCOUNTER — Ambulatory Visit (INDEPENDENT_AMBULATORY_CARE_PROVIDER_SITE_OTHER): Payer: BC Managed Care – PPO | Admitting: Student

## 2022-04-13 ENCOUNTER — Encounter: Payer: Self-pay | Admitting: Student

## 2022-04-13 VITALS — BP 161/97 | HR 88 | Ht 65.0 in | Wt 165.4 lb

## 2022-04-13 DIAGNOSIS — R7303 Prediabetes: Secondary | ICD-10-CM

## 2022-04-13 DIAGNOSIS — R011 Cardiac murmur, unspecified: Secondary | ICD-10-CM

## 2022-04-13 DIAGNOSIS — E782 Mixed hyperlipidemia: Secondary | ICD-10-CM | POA: Diagnosis not present

## 2022-04-13 DIAGNOSIS — I10 Essential (primary) hypertension: Secondary | ICD-10-CM | POA: Diagnosis not present

## 2022-04-13 DIAGNOSIS — R739 Hyperglycemia, unspecified: Secondary | ICD-10-CM | POA: Diagnosis not present

## 2022-04-13 LAB — POCT GLYCOSYLATED HEMOGLOBIN (HGB A1C): Hemoglobin A1C: 5.7 % — AB (ref 4.0–5.6)

## 2022-04-13 MED ORDER — HYDROCHLOROTHIAZIDE 12.5 MG PO TABS: 12.5000 mg | ORAL_TABLET | Freq: Every day | ORAL | 3 refills | Status: DC

## 2022-04-13 NOTE — Assessment & Plan Note (Addendum)
Newly presenting systolic murmur, not radiating to neck.

## 2022-04-13 NOTE — Assessment & Plan Note (Signed)
Currently taking Lipitor 20 mg daily.  Recheck lipid panel today as this has not been checked for 3 years.

## 2022-04-13 NOTE — Assessment & Plan Note (Signed)
Hyperglycemic up to 150s on most recent BMPs.  Concern for prediabetes or diabetes.  Check A1c today.

## 2022-04-13 NOTE — Assessment & Plan Note (Signed)
BP: (!) 161/97 today. Poorly controlled. Goal of <130/80. Follow up in 1 month.  Medication regimen: Amlodipine 10 mg daily, olmesartan 40 mg daily, start HCTZ 12.5 mg daily

## 2022-04-13 NOTE — Patient Instructions (Addendum)
It was great to see you today! Thank you for choosing [facility] for your primary care. Megan Baxter was seen for blood pressure follow-up.  Today we addressed: High blood pressure: Please continue to take your current medications.  Additionally, I have prescribed you hydrochlorothiazide to take daily.  I have attached a blood pressure log if you are able to take your blood pressures at home.  Please follow-up in 1 month to continue discussing blood pressure management. High blood sugar: I am checking your A1c to check for diabetes. High cholesterol: I am checking your cholesterol levels to see if we need to change your Lipitor dosage. Murmur: We have scheduled you for an echocardiogram which is an ultrasound of your heart. This will allow [place] to see if you have any further concerns.  If you haven't already, sign up for My Chart to have easy access to your labs results, and communication with your primary care physician.  We are checking some labs today. If they are abnormal, I will call you. If they are normal, I will send you a MyChart message (if it is active) or a letter in the mail. If you do not hear about your labs in the next 2 weeks, please call the office. I recommend that you always bring your medications to each appointment as this makes it easy to ensure you are on the correct medications and helps [place] not miss refills when you need them. Call the clinic at [phone] if your symptoms worsen or you have any concerns.  You should return to our clinic Return in about 1 month (around 05/13/2022) for HTN follow-up. Please arrive 15 minutes before your appointment to ensure smooth check in process.  We appreciate your efforts in making this happen.  Thank you for allowing me to participate in your care, [name], DO 04/13/2022, 3:07 PM PGY-2, [facility]   Blood Pressure Record Sheet To take your blood pressure, you will need a blood pressure machine.  You can buy a blood pressure machine (blood pressure monitor) at your clinic, drug store, or online. When choosing one, consider: An automatic monitor that has an arm cuff. A cuff that wraps snugly around your upper arm. You should be able to fit only one finger between your arm and the cuff. A device that stores blood pressure reading results. Do not choose a monitor that measures your blood pressure from your wrist or finger. Follow your health care provider's instructions for how to take your blood pressure. To use this form: Take your blood pressure medications every day These measurements should be taken when you have been at rest for at least 10-15 min Take at least 2 readings with each blood pressure check. This makes sure the results are correct. Wait 1-2 minutes between measurements. Write down the results in the spaces on this form. Keep in mind it should always be recorded systolic over diastolic. Both numbers are important.  Repeat this every day for 2-3 weeks, or as told by your health care provider.  Make a follow-up appointment with your health care provider to discuss the results.  Blood Pressure Log Date Medications taken? (Y/N) Blood Pressure Time of Day

## 2022-04-14 ENCOUNTER — Encounter: Payer: Self-pay | Admitting: Student

## 2022-04-14 LAB — LIPID PANEL
Chol/HDL Ratio: 3.3 ratio (ref 0.0–4.4)
Cholesterol, Total: 164 mg/dL (ref 100–199)
HDL: 49 mg/dL (ref >39)
LDL Chol Calc (NIH): 91 mg/dL (ref 0–99)
Triglycerides: 134 mg/dL (ref 0–149)
VLDL Cholesterol Cal: 24 mg/dL (ref 5–40)

## 2022-04-20 ENCOUNTER — Ambulatory Visit (HOSPITAL_COMMUNITY)
Admission: RE | Admit: 2022-04-20 | Discharge: 2022-04-20 | Disposition: A | Discharge status: Home / Self Care | Payer: BC Managed Care – PPO | Source: Ambulatory Visit | Attending: Family Medicine | Admitting: Family Medicine

## 2022-04-20 DIAGNOSIS — I3481 Nonrheumatic mitral (valve) annulus calcification: Secondary | ICD-10-CM | POA: Diagnosis not present

## 2022-04-20 DIAGNOSIS — E785 Hyperlipidemia, unspecified: Secondary | ICD-10-CM | POA: Insufficient documentation

## 2022-04-20 DIAGNOSIS — I119 Hypertensive heart disease without heart failure: Secondary | ICD-10-CM | POA: Insufficient documentation

## 2022-04-20 DIAGNOSIS — E119 Type 2 diabetes mellitus without complications: Secondary | ICD-10-CM | POA: Diagnosis not present

## 2022-04-20 DIAGNOSIS — I10 Essential (primary) hypertension: Secondary | ICD-10-CM

## 2022-04-20 DIAGNOSIS — R011 Cardiac murmur, unspecified: Secondary | ICD-10-CM

## 2022-04-20 LAB — ECHOCARDIOGRAM COMPLETE
Area-P 1/2: 5.38 cm2
Calc EF: 70.2 %
S' Lateral: 2.6 cm
Single Plane A2C EF: 73.1 %
Single Plane A4C EF: 68.6 %

## 2022-04-20 MED ORDER — PERFLUTREN LIPID MICROSPHERE
1.0000 mL | INTRAVENOUS | Status: AC | PRN
  Administered 2022-04-20: 2 mL via INTRAVENOUS
  Filled 2022-04-20: qty 10

## 2022-04-22 ENCOUNTER — Encounter: Payer: Self-pay | Admitting: Student

## 2022-05-11 ENCOUNTER — Ambulatory Visit (INDEPENDENT_AMBULATORY_CARE_PROVIDER_SITE_OTHER): Payer: BC Managed Care – PPO | Admitting: Student

## 2022-05-11 ENCOUNTER — Encounter: Payer: Self-pay | Admitting: Student

## 2022-05-11 VITALS — BP 181/98 | HR 84 | Ht 65.0 in | Wt 164.6 lb

## 2022-05-11 DIAGNOSIS — E782 Mixed hyperlipidemia: Secondary | ICD-10-CM | POA: Diagnosis not present

## 2022-05-11 DIAGNOSIS — I10 Essential (primary) hypertension: Secondary | ICD-10-CM

## 2022-05-11 MED ORDER — SPIRONOLACTONE 25 MG PO TABS: 25.0000 mg | ORAL_TABLET | Freq: Every day | ORAL | 0 refills | Status: DC

## 2022-05-11 MED ORDER — OLMESARTAN-AMLODIPINE-HCTZ 40-10-12.5 MG PO TABS: 1.0000 | ORAL_TABLET | Freq: Every day | ORAL | 0 refills | Status: DC

## 2022-05-11 MED ORDER — ATORVASTATIN CALCIUM 20 MG PO TABS: 20.0000 mg | ORAL_TABLET | Freq: Every day | ORAL | 3 refills | Status: DC
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
## 2022-05-11 NOTE — Assessment & Plan Note (Addendum)
BP: (!) 181/98 today. Poorly controlled. Goal of <130/80.  Medication regimen: Represcribed medications and to combo pill (amlodipine 10 mg, olmesartan 40 mg, HCTZ 12.5 mg), initiated spironolactone 25 mg daily Lab follow-up in 1 week.  Follow-up in 3 weeks.

## 2022-05-11 NOTE — Patient Instructions (Signed)
C'tait super de vous voir aujourd'hui ! Merci d'avoir choisi [facility] pour vos soins primaires. Megan Baxter a t consulte pour une hypertension.  Aujourd'hui, nous avons abord : 1. Hypertension artrielle : veuillez arrter de prendre vos 3 mdicaments prcdents. Je vous ai prescrit une combinaison de mdicaments qui contient les trois dans un seul comprim  la mme dose. J'ai galement prescrit de la spironolactone  prendre quotidiennement. Mme s'il y aura plus de mdicaments, il y aura moins de pilules. Je ne prescris qu'une dose de 30 jours parce que j'aimerais que vous reveniez dans quelques semaines pour qu'on puisse vrifier comment a se passe. J'aurai besoin que vous reveniez dans 1 semaine pour un rendez-vous au laboratoire puisque nous commenons ce nouveau mdicament et cela peut avoir pour effet d'augmenter trop son potassium.  Si vous ne l'avez pas dj fait, inscrivez-vous  My Chart pour accder facilement  vos rsultats de laboratoire et [employer] votre mdecin traitant.  Nous vrifions quelques laboratoires aujourd'hui. S'ils sont anormaux, je vous appellerai. S'ils sont normaux, je vous enverrai un message MyChart (s'il est actif) ou une lettre par courrier. Si vous n'avez pas de nouvelles de vos laboratoires [employer] 2 prochaines semaines, Careers information officer [employer]. Appelez la clinique au [phone] si vos symptmes s'aggravent ou si vous avez des inquitudes.  Vous devez revenir  [employer] environ 3 semaines (vers le 01/04/2022) pour un suivi HTN. Veuillez arriver 15 minutes avant votre rendez-vous pour garantir un processus d'enregistrement fluide. Nous apprcions vos efforts pour y Biomedical scientist.  Thank you for allowing me to participate in your care, [name], DO 05/11/2022, 10:45 AM PGY-2, [place]

## 2022-05-18 ENCOUNTER — Other Ambulatory Visit: Payer: Medicaid Other

## 2022-05-18 DIAGNOSIS — I10 Essential (primary) hypertension: Secondary | ICD-10-CM

## 2022-05-19 ENCOUNTER — Encounter: Payer: Self-pay | Admitting: Student

## 2022-05-19 LAB — BASIC METABOLIC PANEL
BUN/Creatinine Ratio: 13 (ref 9–23)
BUN: 8 mg/dL (ref 6–24)
CO2: 21 mmol/L (ref 20–29)
Calcium: 10.1 mg/dL (ref 8.7–10.2)
Chloride: 100 mmol/L (ref 96–106)
Creatinine, Ser: 0.64 mg/dL (ref 0.57–1.00)
Glucose: 98 mg/dL (ref 70–99)
Potassium: 4.7 mmol/L (ref 3.5–5.2)
Sodium: 138 mmol/L (ref 134–144)
eGFR: 108 mL/min/{1.73_m2} (ref >59)

## 2022-06-01 ENCOUNTER — Encounter: Payer: Self-pay | Admitting: Student

## 2022-06-01 ENCOUNTER — Ambulatory Visit (INDEPENDENT_AMBULATORY_CARE_PROVIDER_SITE_OTHER): Payer: BC Managed Care – PPO | Admitting: Student

## 2022-06-01 DIAGNOSIS — I10 Essential (primary) hypertension: Secondary | ICD-10-CM

## 2022-06-01 MED ORDER — OLMESARTAN-AMLODIPINE-HCTZ 40-10-12.5 MG PO TABS: 1.0000 | ORAL_TABLET | Freq: Every day | ORAL | 0 refills | Status: DC

## 2022-06-01 MED ORDER — SPIRONOLACTONE 25 MG PO TABS: 25.0000 mg | ORAL_TABLET | Freq: Every day | ORAL | 0 refills | Status: DC

## 2022-06-01 NOTE — Progress Notes (Unsigned)
  SUBJECTIVE:   CHIEF COMPLAINT / HPI:   BP F/u  Reports she's only been taking spironolactone and atorvastatin. Has stopped taking other meds, said that she stopped them two weeks ago. Patient intended to be on combo pill plus spironlactone, but has only been taking spironalactone. Meds: Combo of (amlodipine 10 mg, olmesartan 40 mg, HCTZ 12.5 mg), and spironolactone 25 mg daily    PERTINENT  PMH / PSH: HTN   OBJECTIVE:  There were no vitals taken for this visit. Physical Exam   ASSESSMENT/PLAN:  There are no diagnoses linked to this encounter. No follow-ups on file. Holley Bouche, MD 06/01/2022, 6:06 AM PGY-2, [place] {    This will disappear when note is signed, click to select method of visit    :1}

## 2022-06-01 NOTE — Patient Instructions (Addendum)
It was great to see you! Thank you for allowing me to participate in your care!  Thank you for bringing your medicine to the appointment. I recommend that you always bring your medications to each appointment as this makes it easy to ensure we are on the correct medications and helps [place] not miss when refills are needed.  Our plans for today:  - Continue Spironolactone daily - Take combination pill daily (Has a combination of 3 medicine - amlodipine, olmesartan, HCTZ) - Take both pills daily - Make follow up appointment to be seen in 2 weeks - Check blood pressure daily and record the values  Seek immediate medical care if: Develop chest pain or Shortness of breath Develop headache that is unbearable or wont go away with medication   Take care and seek immediate care sooner if you develop any concerns.   Dr. Holley Bouche, MD [facility]           In [place] super de te voir ! Merci de me permettre de participer  vos soins !  Merci d'apporter vos mdicaments au rendez-vous. Je vous recommande de toujours apporter vos mdicaments  chaque rendez-vous, car cela nous permet de nous assurer facilement que nous prenons les bons mdicaments et nous aide  ne pas manquer les renouvellements ncessaires.  Nos projets pour aujourd'hui : - Continuez la Spironolactone quotidiennement - Prendre quotidiennement une pilule combine (contient une combinaison de 3 mdicaments : amlodipine, olmsartan, HCTZ) - Prenez les deux comprims quotidiennement - Prendre rendez-vous de Administrator, arts 2 semaines - Copy quotidiennement la tension artrielle et enregistrez les valeurs  Consultez immdiatement un mdecin si : Dvelopper des douleurs thoraciques ou un essoufflement Dvelopper des maux de tte insupportables ou qui ne disparaissent pas avec des mdicaments   Faites attention et [employer] des soins immdiats plus tt si vous dveloppez des inquitudes.  Dr Holley Bouche,  MD [facility]

## 2022-06-04 NOTE — Assessment & Plan Note (Addendum)
Patient presents for BP f/u, but has only been taking spirnolactone. BP elevated today 162/104. Patient thought she was to d/c the other meds. Explained to patient that her other 3 meds had been combined into 1 pill. She was to be taking two pills to manage her BP. Patient understood and agreeable. Patient otherwise doing well with no complaints of chest pain or SOB.  -Continue Spirnolactone 25 -Continue Amlodipine 10, olmesartan 40, HCTZ 12.5 (combined) - F/u in 2 weeks -Record BP at home -Emergency precautions reviewed

## 2022-06-15 ENCOUNTER — Encounter: Payer: Self-pay | Admitting: Student

## 2022-06-15 ENCOUNTER — Ambulatory Visit (INDEPENDENT_AMBULATORY_CARE_PROVIDER_SITE_OTHER): Payer: BC Managed Care – PPO | Admitting: Student

## 2022-06-15 VITALS — BP 141/82 | HR 90 | Wt 164.8 lb

## 2022-06-15 DIAGNOSIS — I10 Essential (primary) hypertension: Secondary | ICD-10-CM | POA: Diagnosis not present

## 2022-06-15 MED ORDER — OLMESARTAN-AMLODIPINE-HCTZ 40-10-12.5 MG PO TABS: 1.0000 | ORAL_TABLET | Freq: Every day | ORAL | 0 refills | Status: DC

## 2022-06-15 NOTE — Patient Instructions (Addendum)
It was great to see you! Thank you for allowing me to participate in your care!  I recommend that you always bring your medications to each appointment as this makes it easy to ensure you are on the correct medications and helps [place] not miss when refills are needed.  Our plans for today:  - Check your blood pressure at home once a day and record it. There should be a top number and a bottom number.  - The goal is for the top number to be at or below 140 and the bottom number to be at or below 90: < or = 140/90 -Continue both blood pressure medications: Spironolactone and olmesartan-amlodipine-HCTZ -return in 2 weeks for a follow up visit  We are checking some labs today, I will call you if they are abnormal will send you a MyChart message or a letter if they are normal.  If you do not hear about your labs in the next 2 weeks please let us know.  Take care and seek immediate care sooner if you develop any concerns.   Dr. Precious Gilding, DO [facility]

## 2022-06-15 NOTE — Assessment & Plan Note (Signed)
Blood pressure readings at home have been in the 210 systolic, higher than goal of 140/90.  However, since repeat blood pressure today was 141/82, we will continue with current medication regimen and have patient come and to recheck blood pressure in 2 weeks.  She was instructed to check her blood pressure once daily and record it and bring the readings to her next visit.  If blood pressures are elevated next visit, can consider increasing spironolactone to 50 mg daily. -Continue both blood pressure medications: Spironolactone 25 mg and olmesartan-amlodipine-HCTZ 40-10-12.5 mg  -return in 2 weeks for a follow up visit -BMP today

## 2022-06-15 NOTE — Progress Notes (Signed)
    SUBJECTIVE:   CHIEF COMPLAINT / HPI:   HTN Patient was seen on 06/01/2022 for hypertension. She has been taking both spironolactone and the olmesartan-amlodipine-HCTZ 40-10-12.5 mg combo pill every day. Bps at home have been recorded to be mostly in 734'K systolic and 87'G-811'X diastolic. She states she feels well and denies and headache, blurry vision, or chest pain.    PERTINENT  PMH / PSH: HTN  OBJECTIVE:   Vitals:   06/15/22 0840 06/15/22 0854  BP: (!) 160/90 (!) 141/82  Pulse: 90   SpO2: 98%      General: NAD, pleasant, able to participate in exam Cardiac: RRR, no murmurs. Respiratory: CTAB, normal effort, No wheezes, rales or rhonchi Skin: warm and dry, no rashes noted Neuro: alert, no obvious focal deficits Psych: Normal affect and mood  ASSESSMENT/PLAN:   Essential hypertension Blood pressure readings at home have been in the 726 systolic, higher than goal of 140/90.  However, since repeat blood pressure today was 141/82, we will continue with current medication regimen and have patient come and to recheck blood pressure in 2 weeks.  She was instructed to check her blood pressure once daily and record it and bring the readings to her next visit.  If blood pressures are elevated next visit, can consider increasing spironolactone to 50 mg daily. -Continue both blood pressure medications: Spironolactone 25 mg and olmesartan-amlodipine-HCTZ 40-10-12.5 mg  -return in 2 weeks for a follow up visit -BMP today     Dr. Precious Gilding, [place]

## 2022-06-16 ENCOUNTER — Encounter: Payer: Self-pay | Admitting: Student

## 2022-06-16 LAB — BASIC METABOLIC PANEL
BUN/Creatinine Ratio: 20 (ref 9–23)
BUN: 15 mg/dL (ref 6–24)
CO2: 22 mmol/L (ref 20–29)
Calcium: 10.2 mg/dL (ref 8.7–10.2)
Chloride: 97 mmol/L (ref 96–106)
Creatinine, Ser: 0.74 mg/dL (ref 0.57–1.00)
Glucose: 93 mg/dL (ref 70–99)
Potassium: 4.4 mmol/L (ref 3.5–5.2)
Sodium: 135 mmol/L (ref 134–144)
eGFR: 99 mL/min/{1.73_m2} (ref >59)

## 2022-06-28 NOTE — Progress Notes (Signed)
  SUBJECTIVE:   CHIEF COMPLAINT / HPI:   Hypertension: BP: (!) 142/78 today. Home medications include: Olmesartan-amlodipine-HCTZ 40-10-12.5 mg daily, spironolactone 25 mg daily. She endorses taking these medications as prescribed. Does check blood pressure at home. 140s-150s/90s-100s at home.  Patient has had a BMP in the past 1 year.  PERTINENT  PMH / PSH: HTN, HLD, prediabetes  OBJECTIVE:  BP (!) 142/78   Pulse 81   Wt 166 lb 9.6 oz (75.6 kg)   LMP 06/25/2022   SpO2 98%   BMI 27.72 kg/m  General: Awake, alert, NAD CV: RRR, no murmurs auscultated Pulm: CTAB, normal WOB  ASSESSMENT/PLAN:  Essential hypertension Assessment & Plan: BP: (!) 142/78 today.  Improved, Poorly controlled. Goal of <130/80. Follow up in 2 to 3 weeks via phone and notify me/my staff of blood pressures at home.  Lab appointment set in 1 week to check for hyperkalemia and change in kidney function. Medication regimen: Increase to olmesartan-amlodipine-HCTZ 40 - 10 - 25 mg daily, spironolactone 50 mg daily  Orders: -     Olmesartan-amLODIPine-HCTZ; Take 1 tablet by mouth daily.  Dispense: 90 tablet; Refill: 3 -     Spironolactone; Take 1 tablet (50 mg total) by mouth daily.  Dispense: 30 tablet; Refill: 0  Shelby Mattocks, DO 06/29/2022, 2:00 PM PGY-2, [facility]

## 2022-06-29 ENCOUNTER — Encounter: Payer: Self-pay | Admitting: Student

## 2022-06-29 ENCOUNTER — Ambulatory Visit (INDEPENDENT_AMBULATORY_CARE_PROVIDER_SITE_OTHER): Payer: BC Managed Care – PPO | Admitting: Student

## 2022-06-29 VITALS — BP 142/78 | HR 81 | Wt 166.6 lb

## 2022-06-29 DIAGNOSIS — I10 Essential (primary) hypertension: Secondary | ICD-10-CM

## 2022-06-29 MED ORDER — SPIRONOLACTONE 50 MG PO TABS: 50.0000 mg | ORAL_TABLET | Freq: Every day | ORAL | 0 refills | Status: DC

## 2022-06-29 MED ORDER — OLMESARTAN-AMLODIPINE-HCTZ 40-10-25 MG PO TABS: 1.0000 | ORAL_TABLET | Freq: Every day | ORAL | 3 refills | Status: DC

## 2022-06-29 NOTE — Assessment & Plan Note (Signed)
BP: (!) 142/78 today.  Improved, Poorly controlled. Goal of <130/80. Follow up in 2 to 3 weeks via phone and notify me/my staff of blood pressures at home.  Lab appointment set in 1 week to check for hyperkalemia and change in kidney function. Medication regimen: Increase to olmesartan-amlodipine-HCTZ 40 - 10 - 25 mg daily, spironolactone 50 mg daily

## 2022-06-29 NOTE — Patient Instructions (Addendum)
It was great to see you today! Thank you for choosing [facility] for your primary care. Megan Baxter was seen for hypertension follow-up.  Today we addressed: Hypertension: I have increased both of your medications to olmesartan-amlodipine-HCTZ 40 - 10 - 25 mg daily and spironolactone 50 mg daily.  Your goal blood pressure is <130/80.  I have attached a blood pressure log for you to write your blood pressures down daily.  You need a lab appointment in 1 week.  And follow-up in 2 weeks.  If you haven't already, sign up for My Chart to have easy access to your labs results, and communication with your primary care physician.  I recommend that you always bring your medications to each appointment as this makes it easy to ensure you are on the correct medications and helps [place] not miss refills when you need them. Call the clinic at [phone] if your symptoms worsen or you have any concerns.  You should return to our clinic Return in about 2 weeks (around 07/13/2022). Please arrive 15 minutes before your appointment to ensure smooth check in process.  We appreciate your efforts in making this happen.  Thank you for allowing me to participate in your care, [name], DO 06/29/2022, 1:48 PM PGY-2, [place]   Blood Pressure Record Sheet To take your blood pressure, you will need a blood pressure machine. You can buy a blood pressure machine (blood pressure monitor) at your clinic, drug store, or online. When choosing one, consider: An automatic monitor that has an arm cuff. A cuff that wraps snugly around your upper arm. You should be able to fit only one finger between your arm and the cuff. A device that stores blood pressure reading results. Do not choose a monitor that measures your blood pressure from your wrist or finger. Follow your health care provider's instructions for how to take your blood pressure. To use this form: Take your blood pressure  medications every day These measurements should be taken when you have been at rest for at least 10-15 min Take at least 2 readings with each blood pressure check. This makes sure the results are correct. Wait 1-2 minutes between measurements. Write down the results in the spaces on this form. Keep in mind it should always be recorded systolic over diastolic. Both numbers are important.  Repeat this every day for 2-3 weeks, or as told by your health care provider.  Make a follow-up appointment with your health care provider to discuss the results.  Blood Pressure Log Date Medications taken? (Y/N) Blood Pressure Time of Day

## 2022-07-02 ENCOUNTER — Other Ambulatory Visit: Payer: Self-pay | Admitting: Student

## 2022-07-02 DIAGNOSIS — I10 Essential (primary) hypertension: Secondary | ICD-10-CM

## 2022-07-06 ENCOUNTER — Other Ambulatory Visit: Payer: BC Managed Care – PPO

## 2022-07-06 DIAGNOSIS — I10 Essential (primary) hypertension: Secondary | ICD-10-CM | POA: Diagnosis not present

## 2022-07-07 ENCOUNTER — Encounter: Payer: Self-pay | Admitting: Student

## 2022-07-07 LAB — BASIC METABOLIC PANEL
BUN/Creatinine Ratio: 15 (ref 9–23)
BUN: 11 mg/dL (ref 6–24)
CO2: 20 mmol/L (ref 20–29)
Calcium: 9.7 mg/dL (ref 8.7–10.2)
Chloride: 101 mmol/L (ref 96–106)
Creatinine, Ser: 0.71 mg/dL (ref 0.57–1.00)
Glucose: 134 mg/dL — ABNORMAL HIGH (ref 70–99)
Potassium: 4 mmol/L (ref 3.5–5.2)
Sodium: 136 mmol/L (ref 134–144)
eGFR: 104 mL/min/{1.73_m2} (ref >59)

## 2022-08-28 ENCOUNTER — Other Ambulatory Visit: Payer: Self-pay | Admitting: Student

## 2022-08-28 DIAGNOSIS — I10 Essential (primary) hypertension: Secondary | ICD-10-CM

## 2022-09-07 ENCOUNTER — Telehealth: Payer: Self-pay

## 2022-09-07 NOTE — Telephone Encounter (Signed)
Patients daughter calls nurse line in regards to blood pressure.   She reports the pharmacy only gave her #30 on 12/4. She reports her mother has not taken her medication in "a few weeks." She reports the pharmacy is out of stock.   I asked the patient to take her BP while on the phone with me. She reported 150/190. She does report intermittent headaches and dizziness.   I offered an apt for tomorrow morning, however she reports she has to work.  Given this blood pressure is unreliable and she has been out of her medication and has been symptomatic, I advised them to go to and UC this evening.   They agreed with plan.

## 2022-09-08 NOTE — Telephone Encounter (Signed)
Called and left voicemail for patient to call back to schedule follow up appointment.   Thanks [employer]

## 2022-10-26 ENCOUNTER — Encounter: Payer: Self-pay | Admitting: Student

## 2022-10-26 ENCOUNTER — Ambulatory Visit (INDEPENDENT_AMBULATORY_CARE_PROVIDER_SITE_OTHER): Payer: BC Managed Care – PPO | Admitting: Student

## 2022-10-26 VITALS — BP 138/80 | HR 92 | Wt 164.0 lb

## 2022-10-26 DIAGNOSIS — I10 Essential (primary) hypertension: Secondary | ICD-10-CM

## 2022-10-26 DIAGNOSIS — O139 Gestational [pregnancy-induced] hypertension without significant proteinuria, unspecified trimester: Secondary | ICD-10-CM | POA: Insufficient documentation

## 2022-10-26 DIAGNOSIS — S46811A Strain of other muscles, fascia and tendons at shoulder and upper arm level, right arm, initial encounter: Secondary | ICD-10-CM | POA: Diagnosis not present

## 2022-10-26 DIAGNOSIS — S46819A Strain of other muscles, fascia and tendons at shoulder and upper arm level, unspecified arm, initial encounter: Secondary | ICD-10-CM | POA: Insufficient documentation

## 2022-10-26 HISTORY — DX: Gestational (pregnancy-induced) hypertension without significant proteinuria, unspecified trimester: O13.9

## 2022-10-26 MED ORDER — OLMESARTAN-AMLODIPINE-HCTZ 40-10-25 MG PO TABS: 1.0000 | ORAL_TABLET | Freq: Every day | ORAL | 1 refills | Status: DC

## 2022-10-26 MED ORDER — SPIRONOLACTONE 50 MG PO TABS: 50.0000 mg | ORAL_TABLET | Freq: Every day | ORAL | 1 refills | Status: DC

## 2022-10-26 NOTE — Assessment & Plan Note (Signed)
Advised conservative management, reassured given benign appearance of mole in that area.

## 2022-10-26 NOTE — Progress Notes (Signed)
  SUBJECTIVE:   CHIEF COMPLAINT / HPI:   Hypertension: BP: 138/80 today. Home medications include: Olmesartan-amlodipine-HCTZ 40 - 10 - 25 mg daily, spironolactone 50 mg daily. She endorses taking these medications as prescribed.   Patient has had a BMP in the past 1 year.  Neck pain: notes that she has a spot on her neck/upper back. Endorses pain since February.  She believes that the mole that is there may be related and could be cancerous.  She has used a muscle cream on there which has helped.   PERTINENT  PMH / PSH: HTN, HLD, prediabetes  Patient Care Team: [name], DO as PCP - General (Family Medicine) Jerline Pain, MD as PCP - Cardiology (Cardiology) Ethelda Chick as Social Worker OBJECTIVE:  BP 138/80   Pulse 92   Wt 164 lb (74.4 kg)   SpO2 98%   BMI 27.29 kg/m  General: Well-appearing, NAD CV: 3/6 holosystolic murmur at left upper sternal border, RRR Respiratory: CTAB, WOB MSK: Hypertonic right trapezius muscle, full ROM of cervical spine Skin: 2 x 2 millimeter homogenous dark brown well-circumscribed benign-appearing nevus posterior neck/upper back (center of photo)    ASSESSMENT/PLAN:  Essential hypertension Assessment & Plan: BP: 138/80 today. Well controlled. Goal of <140/90. Continue to work on healthy dietary habits and exercise.  Most recent BMP is reflective of being on medication for 1 week.  Would not repeat at this time.  Follow up in 6 months.  Should her blood pressure be elevated in the future medication, may need to consider secondary causes of hypertension and referral to cardiology. Medication regimen: Olmesartan-amlodipine-HCTZ 40 - 10 - 25 mg daily, spironolactone 50 mg daily  Orders: -     Spironolactone; Take 1 tablet (50 mg total) by mouth daily.  Dispense: 180 tablet; Refill: 1 -     Olmesartan-amLODIPine-HCTZ; Take 1 tablet by mouth daily.  Dispense: 180 tablet; Refill: 1  Strain of right trapezius muscle, initial  encounter Assessment & Plan: Advised conservative management, reassured given benign appearance of mole in that area.   Return if symptoms worsen or fail to improve. [name], DO 10/26/2022, 3:13 PM PGY-2, [place]

## 2022-10-26 NOTE — Patient Instructions (Signed)
It was great to see you today! Thank you for choosing [facility] for your primary care. Megan Baxter was seen for blood pressure follow-up.  Today we addressed: Blood pressure has improved, I have refilled your prescriptions for a total of 1 year. Muscle pain: I do not believe you are more is related to this as it does not have features concerning for cancer.  I would recommend monitoring this.  I have attached exercises you can use for trapezius muscle cramping.  I would try these exercises daily.  You may also try Voltaren gel or lidocaine patches which she can buy over-the-counter.  You should also consider if you are pillow has not been changed recently and perhaps sleeping incorrectly may be causing this.  If you haven't already, sign up for My Chart to have easy access to your labs results, and communication with your primary care physician.  Call the clinic at [phone] if your symptoms worsen or you have any concerns.  Please arrive 15 minutes before your appointment to ensure smooth check in process.  We appreciate your efforts in making this happen.  Thank you for allowing me to participate in your care, [name], DO 10/26/2022, 2:55 PM PGY-2, [place]

## 2022-10-26 NOTE — Assessment & Plan Note (Addendum)
BP: 138/80 today. Well controlled. Goal of <140/90. Continue to work on healthy dietary habits and exercise.  Most recent BMP is reflective of being on medication for 1 week.  Would not repeat at this time.  Follow up in 6 months.  Should her blood pressure be elevated in the future medication, may need to consider secondary causes of hypertension and referral to cardiology. Medication regimen: Olmesartan-amlodipine-HCTZ 40 - 10 - 25 mg daily, spironolactone 50 mg daily

## 2022-12-16 ENCOUNTER — Other Ambulatory Visit: Payer: Self-pay

## 2022-12-16 DIAGNOSIS — I10 Essential (primary) hypertension: Secondary | ICD-10-CM

## 2022-12-16 MED ORDER — SPIRONOLACTONE 50 MG PO TABS: 50.0000 mg | ORAL_TABLET | Freq: Every day | ORAL | 1 refills | Status: DC

## 2022-12-16 NOTE — Telephone Encounter (Signed)
Pt changing pharmacy. Sunday Spillers, CMA

## 2023-04-07 ENCOUNTER — Emergency Department (HOSPITAL_COMMUNITY): Payer: BC Managed Care – PPO

## 2023-04-07 ENCOUNTER — Other Ambulatory Visit: Payer: Self-pay

## 2023-04-07 ENCOUNTER — Emergency Department (HOSPITAL_COMMUNITY)
Admission: EM | Admit: 2023-04-07 | Discharge: 2023-04-07 | Disposition: A | Discharge status: Home / Self Care | Payer: BC Managed Care – PPO | Attending: Emergency Medicine | Admitting: Emergency Medicine

## 2023-04-07 ENCOUNTER — Encounter (HOSPITAL_COMMUNITY): Payer: Self-pay

## 2023-04-07 DIAGNOSIS — D649 Anemia, unspecified: Secondary | ICD-10-CM | POA: Insufficient documentation

## 2023-04-07 DIAGNOSIS — Z79899 Other long term (current) drug therapy: Secondary | ICD-10-CM | POA: Diagnosis not present

## 2023-04-07 DIAGNOSIS — R519 Headache, unspecified: Secondary | ICD-10-CM | POA: Diagnosis not present

## 2023-04-07 DIAGNOSIS — I10 Essential (primary) hypertension: Secondary | ICD-10-CM | POA: Diagnosis not present

## 2023-04-07 LAB — CBC WITH DIFFERENTIAL/PLATELET
Abs Immature Granulocytes: 0 10*3/uL (ref 0.00–0.07)
Basophils Absolute: 0 10*3/uL (ref 0.0–0.1)
Basophils Relative: 1 %
Eosinophils Absolute: 0.4 10*3/uL (ref 0.0–0.5)
Eosinophils Relative: 5 %
HCT: 37.2 % (ref 36.0–46.0)
Hemoglobin: 11.5 g/dL — ABNORMAL LOW (ref 12.0–15.0)
Immature Granulocytes: 0 %
Lymphocytes Relative: 28 %
Lymphs Abs: 1.9 10*3/uL (ref 0.7–4.0)
MCH: 25.6 pg — ABNORMAL LOW (ref 26.0–34.0)
MCHC: 30.9 g/dL (ref 30.0–36.0)
MCV: 82.9 fL (ref 80.0–100.0)
Monocytes Absolute: 0.6 10*3/uL (ref 0.1–1.0)
Monocytes Relative: 8 %
Neutro Abs: 4 10*3/uL (ref 1.7–7.7)
Neutrophils Relative %: 58 %
Platelets: 500 10*3/uL — ABNORMAL HIGH (ref 150–400)
RBC: 4.49 MIL/uL (ref 3.87–5.11)
RDW: 14.8 % (ref 11.5–15.5)
WBC: 6.9 10*3/uL (ref 4.0–10.5)
nRBC: 0 % (ref 0.0–0.2)

## 2023-04-07 LAB — MAGNESIUM: Magnesium: 2.2 mg/dL (ref 1.7–2.4)

## 2023-04-07 LAB — COMPREHENSIVE METABOLIC PANEL
ALT: 27 U/L (ref 0–44)
AST: 22 U/L (ref 15–41)
Albumin: 3.9 g/dL (ref 3.5–5.0)
Alkaline Phosphatase: 93 U/L (ref 38–126)
Anion gap: 10 (ref 5–15)
BUN: 8 mg/dL (ref 6–20)
CO2: 24 mmol/L (ref 22–32)
Calcium: 9.1 mg/dL (ref 8.9–10.3)
Chloride: 102 mmol/L (ref 98–111)
Creatinine, Ser: 0.73 mg/dL (ref 0.44–1.00)
GFR, Estimated: >60 mL/min (ref >60)
Glucose, Bld: 121 mg/dL — ABNORMAL HIGH (ref 70–99)
Potassium: 3.5 mmol/L (ref 3.5–5.1)
Sodium: 136 mmol/L (ref 135–145)
Total Bilirubin: 0.6 mg/dL (ref 0.3–1.2)
Total Protein: 7.5 g/dL (ref 6.5–8.1)

## 2023-04-07 LAB — TROPONIN I (HIGH SENSITIVITY)
Troponin I (High Sensitivity): 4 ng/L (ref <18)
Troponin I (High Sensitivity): 4 ng/L (ref <18)

## 2023-04-07 MED ORDER — HYDRALAZINE HCL 20 MG/ML IJ SOLN
10.0000 mg | Freq: Once | INTRAMUSCULAR | Status: AC
  Administered 2023-04-07: 10 mg via INTRAVENOUS
  Filled 2023-04-07: qty 1

## 2023-04-07 MED ORDER — PROCHLORPERAZINE EDISYLATE 10 MG/2ML IJ SOLN
10.0000 mg | Freq: Once | INTRAMUSCULAR | Status: AC
  Administered 2023-04-07: 10 mg via INTRAVENOUS
  Filled 2023-04-07: qty 2

## 2023-04-07 MED ORDER — DIPHENHYDRAMINE HCL 50 MG/ML IJ SOLN
12.5000 mg | Freq: Once | INTRAMUSCULAR | Status: AC
  Administered 2023-04-07: 12.5 mg via INTRAVENOUS
  Filled 2023-04-07: qty 1

## 2023-04-07 MED ORDER — SODIUM CHLORIDE 0.9 % IV BOLUS
1000.0000 mL | Freq: Once | INTRAVENOUS | Status: AC
  Administered 2023-04-07: 1000 mL via INTRAVENOUS

## 2023-04-07 MED ORDER — KETOROLAC TROMETHAMINE 15 MG/ML IJ SOLN
15.0000 mg | Freq: Once | INTRAMUSCULAR | Status: AC
  Administered 2023-04-07: 15 mg via INTRAVENOUS
  Filled 2023-04-07: qty 1

## 2023-04-07 NOTE — ED Triage Notes (Signed)
Pt reports with a headache since Friday. Pt reports taking her htn medication but not monitoring her bp.

## 2023-04-07 NOTE — Discharge Instructions (Signed)
Your workup today was reassuring.  CT scan of the head did not show any concerning findings.  Blood work was all reassuring.  Headache improved after medications.  Follow-up with your primary care doctor in 1 week to have your blood pressure reevaluated.  Continue taking your medications.  For any concerning symptoms such as severe headache, change in vision, balance issues, or chest pain please return to the emergency room for evaluation.  Votre bilan d'aujourd'hui tait rassurant.  Le scanner de la tte n'a montr aucun rsultat proccupant.  Les analyses de sang taient tout  fait rassurantes.  Les maux de tte se sont amliors aprs [employer].  Faites un suivi avec votre mdecin traitant dans 1 semaine pour [employer] rvaluer votre tension artrielle.  Continuez  prendre vos mdicaments.  Pour tout symptme proccupant tel qu'un mal de tte svre, un changement de vision, des problmes d'quilibre ou une douleur thoracique, Microbiologist retourner aux urgences pour [employer].

## 2023-04-07 NOTE — ED Provider Notes (Signed)
[place] EMERGENCY DEPARTMENT AT [facility] Provider Note   CSN: [id number] Arrival date & time: 04/07/23  4098     History  Chief Complaint  Patient presents with   Headache    Ronneisha Goldin is a 52 y.o. female.  52 year old female presents today for concern of headache.  She states this particular episode has been going on since Friday and has progressively worsened.  This was not a sudden onset headache.  She occasionally gets headache.  No associated fever.  She does have history of hypertension.  Reports compliance with her home medications.  Denies any vision change, balance issues, chest pain, or shortness of breath.  Her blood pressure on arrival was 230/120s.  Her main concern is the headache.  She has not taken anything prior to arrival.  The history is provided by the patient. A language interpreter was used.       Home Medications Prior to Admission medications   Medication Sig Start Date End Date Taking? Authorizing Provider  atorvastatin (LIPITOR) 20 MG tablet Take 1 tablet (20 mg total) by mouth daily. 05/11/22   Shelby Mattocks, DO  EPINEPHrine 0.3 mg/0.3 mL IJ SOAJ injection Inject 0.3 mLs (0.3 mg total) into the muscle as needed for anaphylaxis. 06/05/19   Fulp, Cammie, MD  Olmesartan-amLODIPine-HCTZ 40-10-25 MG TABS Take 1 tablet by mouth daily. 10/26/22   Shelby Mattocks, DO  spironolactone (ALDACTONE) 50 MG tablet Take 1 tablet (50 mg total) by mouth daily. 12/16/22   Shelby Mattocks, DO  lisinopril-hydrochlorothiazide (ZESTORETIC) 20-12.5 MG tablet Take 2 tablets by mouth daily. 05/01/19 05/14/19  Fulp, Hewitt Shorts, MD      Allergies    Drug class [pork-derived products]    Review of Systems   Review of Systems  Constitutional:  Negative for activity change, chills and fever.  Eyes:  Negative for photophobia and visual disturbance.  Respiratory:  Negative for shortness of breath.   Cardiovascular:  Negative for chest pain.  Musculoskeletal:  Negative  for neck pain.  Neurological:  Positive for headaches. Negative for dizziness, speech difficulty, weakness, light-headedness and numbness.  All other systems reviewed and are negative.   Physical Exam Updated Vital Signs BP (!) 165/96   Pulse 96   Temp 99.1 F (37.3 C) (Oral)   Resp 19   Ht 5\' 5"  (1.651 m)   Wt 74.4 kg   SpO2 98%   BMI 27.29 kg/m  Physical Exam Vitals and nursing note reviewed.  Constitutional:      General: She is not in acute distress.    Appearance: Normal appearance. She is not ill-appearing.  HENT:     Head: Normocephalic and atraumatic.     Nose: Nose normal.  Eyes:     General: No scleral icterus.    Extraocular Movements: Extraocular movements intact.     Conjunctiva/sclera: Conjunctivae normal.  Cardiovascular:     Rate and Rhythm: Normal rate and regular rhythm.     Heart sounds: Normal heart sounds.  Pulmonary:     Effort: Pulmonary effort is normal. No respiratory distress.     Breath sounds: Normal breath sounds. No wheezing or rales.  Musculoskeletal:        General: Normal range of motion.     Cervical back: Normal range of motion.  Skin:    General: Skin is warm and dry.  Neurological:     General: No focal deficit present.     Mental Status: She is alert and oriented to person, place,  and time. Mental status is at baseline.     Cranial Nerves: No cranial nerve deficit.     Motor: No weakness.     ED Results / Procedures / Treatments   Labs (all labs ordered are listed, but only abnormal results are displayed) Labs Reviewed  CBC WITH DIFFERENTIAL/PLATELET - Abnormal; Notable for the following components:      Result Value   Hemoglobin 11.5 (*)    MCH 25.6 (*)    Platelets 500 (*)    All other components within normal limits  COMPREHENSIVE METABOLIC PANEL - Abnormal; Notable for the following components:   Glucose, Bld 121 (*)    All other components within normal limits  MAGNESIUM  TROPONIN I (HIGH SENSITIVITY)  TROPONIN  I (HIGH SENSITIVITY)    EKG EKG Interpretation Date/Time:  Wednesday April 07 2023 07:04:31 EDT Ventricular Rate:  86 PR Interval:  163 QRS Duration:  83 QT Interval:  377 QTC Calculation: 451 R Axis:   6  Text Interpretation: Sinus rhythm LVH with secondary repolarization abnormality Confirmed by Linwood Dibbles [phone]) on 04/07/2023 7:21:22 AM  Radiology CT Head Wo Contrast  Result Date: 04/07/2023 CLINICAL DATA:  Headache, new onset (Age >= 51y) EXAM: CT HEAD WITHOUT CONTRAST TECHNIQUE: Contiguous axial images were obtained from the base of the skull through the vertex without intravenous contrast. RADIATION DOSE REDUCTION: This exam was performed according to the departmental dose-optimization program which includes automated exposure control, adjustment of the mA and/or kV according to patient size and/or use of iterative reconstruction technique. COMPARISON:  CTA Head 11/18/21 FINDINGS: Brain: No hemorrhage. No hydrocephalus. No extra-axial fluid collection. No CT evidence of an acute cortical infarct. No mass effect. No mass lesion. Vascular: No hyperdense vessel or unexpected calcification. Skull: Normal. Negative for fracture or focal lesion. Sinuses/Orbits: No middle ear or mastoid effusion. Paranasal sinuses are notable for mucosal thickening in the bilateral maxillary sinuses. Orbits are unremarkable. Other: None. IMPRESSION: No CT etiology for headaches identified. Electronically Signed   By: Lorenza Cambridge M.D.   On: 04/07/2023 08:18    Procedures Procedures    Medications Ordered in ED Medications  sodium chloride 0.9 % bolus 1,000 mL (1,000 mLs Intravenous New Bag/Given 04/07/23 0728)  prochlorperazine (COMPAZINE) injection 10 mg (10 mg Intravenous Given 04/07/23 0729)  ketorolac (TORADOL) 15 MG/ML injection 15 mg (15 mg Intravenous Given 04/07/23 0731)  diphenhydrAMINE (BENADRYL) injection 12.5 mg (12.5 mg Intravenous Given 04/07/23 0730)  hydrALAZINE (APRESOLINE) injection 10  mg (10 mg Intravenous Given 04/07/23 4782)    ED Course/ Medical Decision Making/ A&P                                 Medical Decision Making Amount and/or Complexity of Data Reviewed Labs: ordered. Radiology: ordered.  Risk Prescription drug management.   Medical Decision Making / ED Course   This patient presents to the ED for concern of headache, this involves an extensive number of treatment options, and is a complaint that carries with it a high risk of complications and morbidity.  The differential diagnosis includes migraine headache, tension headache, cluster headache, CVA, meningitis  MDM: 52 year old female with history of hypertension presents today for concern of headache and significantly elevated blood pressure.  Reports compliance with her home medications.  Headache progressively worsening since Friday.  No vision change or other concerning symptoms suspicious for hypertensive emergency.  Will obtain CT head, blood work  including troponin.  Will provide migraine cocktail, and dose of hydralazine and reevaluate.  CBC without leukocytosis.  Mild anemia noted.  Around her baseline.  Hemodynamically stable.  No suspicion for acute GI bleed.  CMP with glucose of 121 otherwise unremarkable.  Magnesium 2.2.  Initial troponin of 4.  EKG without acute ischemic changes.  Given no chest pain.  No suspicion for ACS.  CT head without acute intracranial process.  Reevaluation patient reports resolution of headache.  She is appropriate for discharge.  Strict return precautions given.  Discussed close follow-up with PCP within 1 week for reevaluation of BP.  Discussed continued compliance with her home medications.  BP improved prior to discharge.  She is stable for discharge.   Additional history obtained: -Additional history obtained from prior PCP notes particularly from April 2024 -External records from outside source obtained and reviewed including: Chart review including previous  notes, labs, imaging, consultation notes   Lab Tests: -I ordered, reviewed, and interpreted labs.   The pertinent results include:   Labs Reviewed  CBC WITH DIFFERENTIAL/PLATELET - Abnormal; Notable for the following components:      Result Value   Hemoglobin 11.5 (*)    MCH 25.6 (*)    Platelets 500 (*)    All other components within normal limits  COMPREHENSIVE METABOLIC PANEL - Abnormal; Notable for the following components:   Glucose, Bld 121 (*)    All other components within normal limits  MAGNESIUM  TROPONIN I (HIGH SENSITIVITY)  TROPONIN I (HIGH SENSITIVITY)      EKG  EKG Interpretation Date/Time:  Wednesday April 07 2023 07:04:31 EDT Ventricular Rate:  86 PR Interval:  163 QRS Duration:  83 QT Interval:  377 QTC Calculation: 451 R Axis:   6  Text Interpretation: Sinus rhythm LVH with secondary repolarization abnormality Confirmed by Linwood Dibbles [phone]) on 04/07/2023 7:21:22 AM         Imaging Studies ordered: I ordered imaging studies including CT head without contrast I independently visualized and interpreted imaging. I agree with the radiologist interpretation   Medicines ordered and prescription drug management: Meds ordered this encounter  Medications   sodium chloride 0.9 % bolus 1,000 mL   prochlorperazine (COMPAZINE) injection 10 mg   ketorolac (TORADOL) 15 MG/ML injection 15 mg   diphenhydrAMINE (BENADRYL) injection 12.5 mg   hydrALAZINE (APRESOLINE) injection 10 mg    -I have reviewed the patients home medicines and have made adjustments as needed   Reevaluation: After the interventions noted above, I reevaluated the patient and found that they have :resolved  Co morbidities that complicate the patient evaluation  Past Medical History:  Diagnosis Date   Delivery of pregnancy by cesarean section 10/26/2022   #1 and 2 SVD #3 2014 LTCS with 2 layer closure--Hypertensive crisis at 36 4/7 weeks (Dr. Estanislado Pandy) Desires a TOLAC #1 and 2 SVD  #3 2014 LTCS with 2 layer closure--Hypertensive crisis at 26 4/7 weeks (Dr. Estanislado Pandy) Desires a TOLAC   Heartburn in pregnancy    Hypertension    Maternal anemia in pregnancy, antepartum 02/18/2015   Pregnancy-induced hypertension 10/26/2022      Dispostion: Patient discharged in stable condition.  Return precaution discussed.  Patient voices understanding and is in agreement with plan.   Final Clinical Impression(s) / ED Diagnoses Final diagnoses:  Bad headache  Uncontrolled hypertension    Rx / DC Orders ED Discharge Orders     None         Marita [place], PA-C  04/07/23 1036    Linwood Dibbles, MD 04/09/23 [phone]

## 2023-09-13 ENCOUNTER — Other Ambulatory Visit: Payer: Self-pay | Admitting: Student

## 2023-09-13 DIAGNOSIS — E782 Mixed hyperlipidemia: Secondary | ICD-10-CM

## 2023-12-15 ENCOUNTER — Ambulatory Visit (HOSPITAL_COMMUNITY)
Admission: EM | Admit: 2023-12-15 | Discharge: 2023-12-15 | Disposition: A | Discharge status: Home / Self Care | Attending: Family Medicine | Admitting: Family Medicine

## 2023-12-15 ENCOUNTER — Encounter (HOSPITAL_COMMUNITY): Payer: Self-pay | Admitting: *Deleted

## 2023-12-15 ENCOUNTER — Other Ambulatory Visit: Payer: Self-pay

## 2023-12-15 DIAGNOSIS — H1032 Unspecified acute conjunctivitis, left eye: Secondary | ICD-10-CM | POA: Diagnosis not present

## 2023-12-15 MED ORDER — ERYTHROMYCIN 5 MG/GM OP OINT: TOPICAL_OINTMENT | OPHTHALMIC | 0 refills | Status: DC

## 2023-12-15 MED ORDER — ERYTHROMYCIN 5 MG/GM OP OINT
TOPICAL_OINTMENT | OPHTHALMIC | 0 refills | Status: DC
Start: 2023-12-15 — End: 2023-12-15

## 2023-12-15 NOTE — ED Triage Notes (Signed)
 Pt declines interpreter.  C/O left eye pain, swelling, tearing onset 2 days ago.

## 2023-12-15 NOTE — Discharge Instructions (Addendum)
 Symptoms and physical exam findings are consistent with bacterial conjunctivitis. This will need to be treated with antibiotics in the eye. We will treat with the following:  Erythromycin ointment nightly in the left eye for 7 days. Pull the left lower eye lid down and put a 1/2 inch ribbon in the eye.  Wash your hands if you touch your eye or drainage from your eye to prevent spreading this.  Return to urgent care or PCP if symptoms worsen or fail to resolve.

## 2023-12-15 NOTE — ED Provider Notes (Signed)
 [facility]    CSN: [id number] Arrival date & time: 12/15/23  1548      History   Chief Complaint Chief Complaint  Patient presents with   Eye Problem    HPI Megan Baxter is a 53 y.o. female.   53 year old female who presents urgent care with complaints of left eye pain, swelling, drainage and redness.  She reports that this started 2 to 3 days ago.  It has gotten worse over the last 24 hours.  She denies any vision loss or blurred vision.  She has not had any fevers, chills, congestion, ear pain, sore throat, cough.  She has not been around anyone that has similar symptoms.  She reports that when she wakes up in the morning her eye is caked over.  She continues to have drainage throughout the day as well.  Her eye is very sensitive to the light.   Eye Problem Associated symptoms: discharge, photophobia and redness   Associated symptoms: no vomiting     Past Medical History:  Diagnosis Date   Delivery of pregnancy by cesarean section 10/26/2022   #1 and 2 SVD #3 2014 LTCS with 2 layer closure--Hypertensive crisis at 36 4/7 weeks (Dr. Duke Gibbons) Desires a TOLAC #1 and 2 SVD #3 2014 LTCS with 2 layer closure--Hypertensive crisis at 36 4/7 weeks (Dr. Duke Gibbons) Desires a TOLAC   Heartburn in pregnancy    Hypertension    Maternal anemia in pregnancy, antepartum 02/18/2015   Pregnancy-induced hypertension 10/26/2022    Patient Active Problem List   Diagnosis Date Noted   Trapezius muscle strain 10/26/2022   Murmur, cardiac 04/13/2022   Housing instability, currently housed, at risk for homelessness 01/22/2022   Food insecurity 01/22/2022   HLD (hyperlipidemia) 01/22/2022   Prediabetes 06/10/2019   Hypertensive disorder 11/21/2015    Past Surgical History:  Procedure Laterality Date   CESAREAN SECTION N/A 04/26/2013   Procedure: CESAREAN SECTION;  Surgeon: Mckinley Spells, MD;  Location: [facility];  Service: Obstetrics;  Laterality: N/A;   CESAREAN SECTION N/A  05/24/2015   Procedure: CESAREAN SECTION;  Surgeon: Renea Carrion, MD;  Location: [facility];  Service: Obstetrics;  Laterality: N/A;    OB History     Gravida  4   Para  4   Term  3   Preterm  1   AB  0   Living  3      SAB  0   IAB  0   Ectopic  0   Multiple  0   Live Births  3            Home Medications    Prior to Admission medications   Medication Sig Start Date End Date Taking? Authorizing Provider  atorvastatin  (LIPITOR) 20 MG tablet Take 1 tablet by mouth once daily 09/14/23  Yes Glenn Lange, DO  Olmesartan -amLODIPine -HCTZ 40-10-25 MG TABS Take 1 tablet by mouth daily. 10/26/22  Yes Veronia Goon, DO  spironolactone  (ALDACTONE ) 50 MG tablet Take 1 tablet (50 mg total) by mouth daily. 12/16/22  Yes Veronia Goon, DO  EPINEPHrine  0.3 mg/0.3 mL IJ SOAJ injection Inject 0.3 mLs (0.3 mg total) into the muscle as needed for anaphylaxis. 06/05/19   Fulp, Cammie, MD  erythromycin ophthalmic ointment Place a 1/2 inch ribbon of ointment into the lower left eyelid nightly for 7 days. 12/15/23   Locklan Canoy A, PA-C  lisinopril -hydrochlorothiazide  (ZESTORETIC ) 20-12.5 MG tablet Take 2 tablets by mouth daily. 05/01/19 05/14/19  Berneda Bridges, MD  Family History Family History  Problem Relation Age of Onset   Hypertension Mother    Healthy Father    Alcohol abuse Neg Hx    Arthritis Neg Hx    Asthma Neg Hx    Birth defects Neg Hx    Cancer Neg Hx    COPD Neg Hx    Depression Neg Hx    Diabetes Neg Hx    Drug abuse Neg Hx    Early death Neg Hx    Hearing loss Neg Hx    Heart disease Neg Hx    Hyperlipidemia Neg Hx    Kidney disease Neg Hx    Learning disabilities Neg Hx    Mental illness Neg Hx    Mental retardation Neg Hx    Miscarriages / Stillbirths Neg Hx    Stroke Neg Hx    Vision loss Neg Hx     Social History Social History   Tobacco Use   Smoking status: Never    Passive exposure: Never   Smokeless tobacco: Never  Vaping Use   Vaping  status: Never Used  Substance Use Topics   Alcohol use: No   Drug use: No     Allergies   Drug class [pork-derived products]   Review of Systems Review of Systems  Constitutional:  Negative for chills and fever.  HENT:  Negative for ear pain and sore throat.   Eyes:  Positive for photophobia, pain, discharge and redness. Negative for visual disturbance.  Respiratory:  Negative for cough and shortness of breath.   Cardiovascular:  Negative for chest pain and palpitations.  Gastrointestinal:  Negative for abdominal pain and vomiting.  Genitourinary:  Negative for dysuria and hematuria.  Musculoskeletal:  Negative for arthralgias and back pain.  Skin:  Negative for color change and rash.  Neurological:  Negative for seizures and syncope.  All other systems reviewed and are negative.    Physical Exam Triage Vital Signs ED Triage Vitals  Encounter Vitals Group     BP 12/15/23 1612 (!) 183/92     Systolic BP Percentile --      Diastolic BP Percentile --      Pulse Rate 12/15/23 1612 93     Resp 12/15/23 1612 16     Temp 12/15/23 1612 98 F (36.7 C)     Temp src --      SpO2 12/15/23 1612 98 %     Weight --      Height --      Head Circumference --      Peak Flow --      Pain Score 12/15/23 1614 6     Pain Loc --      Pain Education --      Exclude from Growth Chart --    No data found.  Updated Vital Signs BP (!) 183/92 Comment: has not had HTN med yet today  Pulse 93   Temp 98 F (36.7 C)   Resp 16   LMP 10/28/2023 (Approximate)   SpO2 98%   Visual Acuity Right Eye Distance: 20/40 -1 Left Eye Distance: 20/40 -1 Bilateral Distance: 20/40 -1  Right Eye Near:   Left Eye Near:    Bilateral Near:     Physical Exam Vitals and nursing note reviewed.  Constitutional:      General: She is not in acute distress.    Appearance: She is well-developed.  HENT:     Head: Normocephalic and atraumatic.  Nose: Nose normal.     Mouth/Throat:     Mouth: Mucous  membranes are moist.  Eyes:     General:        Left eye: Discharge present.    Extraocular Movements:     Right eye: Normal extraocular motion.     Left eye: Normal extraocular motion.     Conjunctiva/sclera:     Left eye: Left conjunctiva is injected. Exudate present.     Pupils: Pupils are equal, round, and reactive to light.     Comments: Dried drainage around the eye with purulent appearing drainage at the edges.  Cardiovascular:     Rate and Rhythm: Normal rate.     Heart sounds: No murmur heard. Pulmonary:     Effort: Pulmonary effort is normal. No respiratory distress.     Breath sounds: Normal breath sounds.  Abdominal:     Palpations: Abdomen is soft.     Tenderness: There is no abdominal tenderness.  Musculoskeletal:        General: No swelling.     Cervical back: Neck supple.  Skin:    General: Skin is warm and dry.     Capillary Refill: Capillary refill takes less than 2 seconds.  Neurological:     Mental Status: She is alert.  Psychiatric:        Mood and Affect: Mood normal.      UC Treatments / Results  Labs (all labs ordered are listed, but only abnormal results are displayed) Labs Reviewed - No data to display  EKG   Radiology No results found.  Procedures Procedures (including critical care time)  Medications Ordered in UC Medications - No data to display  Initial Impression / Assessment and Plan / UC Course  I have reviewed the triage vital signs and the nursing notes.  Pertinent labs & imaging results that were available during my care of the patient were reviewed by me and considered in my medical decision making (see chart for details).     Acute bacterial conjunctivitis of left eye   Symptoms and physical exam findings are consistent with bacterial conjunctivitis with significant drainage, erythema. This will need to be treated with antibiotics in the eye. We will treat with the following:  Erythromycin ointment nightly in the left  eye for 7 days. Pull the left lower eye lid down and put a 1/2 inch ribbon in the eye.  Wash your hands if you touch your eye or drainage from your eye to prevent spreading this.  Return to urgent care or PCP if symptoms worsen or fail to resolve.    Final Clinical Impressions(s) / UC Diagnoses   Final diagnoses:  Acute bacterial conjunctivitis of left eye     Discharge Instructions      Symptoms and physical exam findings are consistent with bacterial conjunctivitis. This will need to be treated with antibiotics in the eye. We will treat with the following:  Erythromycin ointment nightly in the left eye for 7 days. Pull the left lower eye lid down and put a 1/2 inch ribbon in the eye.  Wash your hands if you touch your eye or drainage from your eye to prevent spreading this.  Return to urgent care or PCP if symptoms worsen or fail to resolve.    ED Prescriptions     Medication Sig Dispense Auth. Provider   erythromycin ophthalmic ointment  (Status: Discontinued) Place a 1/2 inch ribbon of ointment into the lower left eyelid for 7  days. 3.5 g Letetia Romanello A, PA-C   erythromycin ophthalmic ointment Place a 1/2 inch ribbon of ointment into the lower left eyelid nightly for 7 days. 3.5 g Kreg Pesa, [place]      PDMP not reviewed this encounter.   Acie Acosta 12/15/23 2004

## 2024-01-07 ENCOUNTER — Other Ambulatory Visit: Payer: Self-pay | Admitting: Student

## 2024-01-07 DIAGNOSIS — I10 Essential (primary) hypertension: Secondary | ICD-10-CM

## 2024-01-09 ENCOUNTER — Emergency Department (HOSPITAL_COMMUNITY)
Admission: EM | Admit: 2024-01-09 | Discharge: 2024-01-09 | Disposition: A | Discharge status: Home / Self Care | Attending: Emergency Medicine | Admitting: Emergency Medicine

## 2024-01-09 ENCOUNTER — Other Ambulatory Visit: Payer: Self-pay

## 2024-01-09 ENCOUNTER — Encounter (HOSPITAL_COMMUNITY): Payer: Self-pay

## 2024-01-09 DIAGNOSIS — Z760 Encounter for issue of repeat prescription: Secondary | ICD-10-CM | POA: Insufficient documentation

## 2024-01-09 DIAGNOSIS — M791 Myalgia, unspecified site: Secondary | ICD-10-CM | POA: Diagnosis not present

## 2024-01-09 DIAGNOSIS — I10 Essential (primary) hypertension: Secondary | ICD-10-CM | POA: Diagnosis not present

## 2024-01-09 MED ORDER — OLMESARTAN-AMLODIPINE-HCTZ 40-10-25 MG PO TABS: 1.0000 | ORAL_TABLET | Freq: Every day | ORAL | 1 refills | Status: DC

## 2024-01-09 NOTE — ED Triage Notes (Signed)
 Pt reports pain from head to chest and back and into stomach. Pt reports she has been out of BP medication for one week.

## 2024-01-09 NOTE — ED Provider Notes (Signed)
 [place] EM[facility]GENCY [facility]MENT AT [facility] Provider Note   CSN: [id number] Arrival date & time: 01/09/24  4098     Patient presents with: Generalized Body Aches   Megan Baxter is a 53 y.o. female.   53 year old female with prior medical history as detailed below presents for evaluation.  Patient reports that she is out of her blood pressure medication.  She ran out a week ago.  She is requesting refill.  She denies chest pain or shortness of breath.  She complains of achy pain all over.  She did not take anything for the pain.  She declines pain medication here in the ED.  Patient primarily would like a refill on her blood pressure medication.  The history is provided by the patient and medical records. A language interpreter was used.       Prior to Admission medications   Medication Sig Start Date End Date Taking? Authorizing Provider  Olmesartan -amLODIPine -HCTZ 40-10-25 MG TABS Take 1 tablet by mouth daily. 01/09/24  Yes Burnette Carte, MD  atorvastatin  (LIPITOR) 20 MG tablet Take 1 tablet by mouth once daily 09/14/23   Glenn Lange, DO  EPINEPHrine  0.3 mg/0.3 mL IJ SOAJ injection Inject 0.3 mLs (0.3 mg total) into the muscle as needed for anaphylaxis. 06/05/19   Fulp, Cammie, MD  erythromycin  ophthalmic ointment Place a 1/2 inch ribbon of ointment into the lower left eyelid nightly for 7 days. 12/15/23   White, Elizabeth A, PA-C  Olmesartan -amLODIPine -HCTZ 40-10-25 MG TABS Take 1 tablet by mouth daily. 10/26/22   Veronia Goon, DO  spironolactone  (ALDACTONE ) 50 MG tablet Take 1 tablet (50 mg total) by mouth daily. 12/16/22   Veronia Goon, DO  lisinopril -hydrochlorothiazide  (ZESTORETIC ) 20-12.5 MG tablet Take 2 tablets by mouth daily. 05/01/19 05/14/19  Berneda Bridges, MD    Allergies: Drug class [pork-derived products]    Review of Systems  All other systems reviewed and are negative.   Updated Vital Signs BP 136/81   Pulse 77   Temp 98.2 F (36.8 C)  (Oral)   Resp 18   Ht 5' 5 (1.651 m)   LMP 10/28/2023 (Approximate)   SpO2 100%   BMI 27.29 kg/m   Physical Exam Vitals and nursing note reviewed.  Constitutional:      General: She is not in acute distress.    Appearance: Normal appearance. She is well-developed.  HENT:     Head: Normocephalic and atraumatic.   Eyes:     Conjunctiva/sclera: Conjunctivae normal.     Pupils: Pupils are equal, round, and reactive to light.    Cardiovascular:     Rate and Rhythm: Normal rate and regular rhythm.     Heart sounds: Normal heart sounds.  Pulmonary:     Effort: Pulmonary effort is normal. No respiratory distress.     Breath sounds: Normal breath sounds.  Abdominal:     General: There is no distension.     Palpations: Abdomen is soft.     Tenderness: There is no abdominal tenderness.   Musculoskeletal:        General: No deformity. Normal range of motion.     Cervical back: Normal range of motion and neck supple.   Skin:    General: Skin is warm and dry.   Neurological:     General: No focal deficit present.     Mental Status: She is alert and oriented to person, place, and time.     (all labs ordered are listed, but only abnormal  results are displayed) Labs Reviewed - No data to display  EKG: EKG Interpretation Date/Time:  Sunday January 09 2024 07:04:26 EDT Ventricular Rate:  71 PR Interval:  160 QRS Duration:  84 QT Interval:  413 QTC Calculation: 449 R Axis:   11  Text Interpretation: Sinus rhythm Left ventricular hypertrophy Confirmed by Florrie Ramires ([id number]) on 01/09/2024 7:21:55 AM  Radiology: No results found.   Procedures   Medications Ordered in the ED - No data to display                                  Medical Decision Making Risk Prescription drug management.    Medical Screen Complete  This patient presented to the ED with complaint of medication refill.  This complaint involves an extensive number of treatment options. The initial  differential diagnosis includes, but is not limited to, medication refill  This presentation is: Acute, Chronic, Self-Limited, Previously Undiagnosed, Uncertain Prognosis, Complicated, Systemic Symptoms, and Threat to Life/Bodily Function  Patient is requesting medication refill.  She needs a renewed prescription for her antihypertensive.  She otherwise does not seem to have an acute complaint.  Exam is without significant abnormality identified.  Patient understands need for close outpatient follow-up.  Strict return precautions given and understood.  Additional history obtained:  External records from outside sources obtained and reviewed including prior ED visits and prior Inpatient records.    Problem List / ED Course:  Medication refill    Disposition:  After consideration of the diagnostic results and the patients response to treatment, I feel that the patent would benefit from close outpatient follow-up.       Final diagnoses:  Medication refill    ED Discharge Orders          Ordered    Olmesartan-amLODIPine-HCTZ 40-10-25 MG TABS  Daily        06 /15/25 0753               Burnette Carte, MD 01/09/24 [phone]

## 2024-01-09 NOTE — Discharge Instructions (Signed)
 Return for any problem.  ?

## 2024-01-13 ENCOUNTER — Emergency Department (HOSPITAL_COMMUNITY)
Admission: EM | Admit: 2024-01-13 | Discharge: 2024-01-14 | Disposition: A | Discharge status: Home / Self Care | Attending: Emergency Medicine | Admitting: Emergency Medicine

## 2024-01-13 ENCOUNTER — Other Ambulatory Visit: Payer: Self-pay

## 2024-01-13 ENCOUNTER — Encounter (HOSPITAL_COMMUNITY): Payer: Self-pay

## 2024-01-13 DIAGNOSIS — I10 Essential (primary) hypertension: Secondary | ICD-10-CM | POA: Diagnosis not present

## 2024-01-13 DIAGNOSIS — N3001 Acute cystitis with hematuria: Secondary | ICD-10-CM | POA: Diagnosis not present

## 2024-01-13 DIAGNOSIS — R1030 Lower abdominal pain, unspecified: Secondary | ICD-10-CM | POA: Diagnosis present

## 2024-01-13 DIAGNOSIS — Z79899 Other long term (current) drug therapy: Secondary | ICD-10-CM | POA: Insufficient documentation

## 2024-01-13 LAB — URINALYSIS, ROUTINE W REFLEX MICROSCOPIC
Bilirubin Urine: NEGATIVE
Glucose, UA: NEGATIVE mg/dL
Ketones, ur: NEGATIVE mg/dL
Nitrite: NEGATIVE
Protein, ur: 30 mg/dL — AB
RBC / HPF: >50 RBC/hpf (ref 0–5)
Specific Gravity, Urine: 1.008 (ref 1.005–1.030)
WBC, UA: >50 WBC/hpf (ref 0–5)
pH: 5 (ref 5.0–8.0)

## 2024-01-13 MED ORDER — CEFADROXIL 500 MG PO CAPS: 500.0000 mg | ORAL_CAPSULE | Freq: Two times a day (BID) | ORAL | 0 refills | Status: DC

## 2024-01-13 MED ORDER — CEPHALEXIN 500 MG PO CAPS
1000.0000 mg | ORAL_CAPSULE | Freq: Once | ORAL | Status: AC
  Administered 2024-01-14: 1000 mg via ORAL
  Filled 2024-01-13: qty 2

## 2024-01-13 NOTE — ED Triage Notes (Signed)
 Interpreter [place] # [phone] PT states she is having frequent pain when she urinates & frequency. Denies any other s/s at time of triage.

## 2024-01-13 NOTE — ED Provider Notes (Signed)
 [place] EMERGENCY DEPARTMENT AT [facility] Provider Note   CSN: [id number] Arrival date & time: 01/13/24  1846     Patient presents with: Abdominal Pain   Megan Baxter is a 53 y.o. female with past medical history significant for hypertension, prediabetes, housing instability, who presents with concern for pain with urination and urinary frequency. Burning with urination. No fever at home.     Abdominal Pain      Prior to Admission medications   Medication Sig Start Date End Date Taking? Authorizing Provider  cefadroxil (DURICEF) 500 MG capsule Take 1 capsule (500 mg total) by mouth 2 (two) times daily. 01/13/24  Yes Dorota Heinrichs H, PA-C  atorvastatin  (LIPITOR) 20 MG tablet Take 1 tablet by mouth once daily 09/14/23   Glenn Lange, DO  EPINEPHrine  0.3 mg/0.3 mL IJ SOAJ injection Inject 0.3 mLs (0.3 mg total) into the muscle as needed for anaphylaxis. 06/05/19   Fulp, Cammie, MD  erythromycin  ophthalmic ointment Place a 1/2 inch ribbon of ointment into the lower left eyelid nightly for 7 days. 12/15/23   White, Elizabeth A, PA-C  Olmesartan -amLODIPine -HCTZ 40-10-25 MG TABS Take 1 tablet by mouth daily. 10/26/22   Dahbura, Anton, DO  Olmesartan -amLODIPine -HCTZ 40-10-25 MG TABS Take 1 tablet by mouth daily. 01/09/24   Burnette Carte, MD  spironolactone  (ALDACTONE ) 50 MG tablet Take 1 tablet (50 mg total) by mouth daily. 12/16/22   Veronia Goon, DO  lisinopril -hydrochlorothiazide  (ZESTORETIC ) 20-12.5 MG tablet Take 2 tablets by mouth daily. 05/01/19 05/14/19  Berneda Bridges, MD    Allergies: Drug class [pork-derived products]    Review of Systems  Gastrointestinal:  Positive for abdominal pain.  All other systems reviewed and are negative.   Updated Vital Signs BP (!) 158/82   Pulse 85   Temp 98.9 F (37.2 C) (Oral)   Resp 17   Ht 5' 5 (1.651 m)   Wt 76 kg   LMP 12/30/2023 (Exact Date)   SpO2 100%   BMI 27.88 kg/m   Physical Exam Vitals and nursing  note reviewed.  Constitutional:      General: She is not in acute distress.    Appearance: Normal appearance.  HENT:     Head: Normocephalic and atraumatic.   Eyes:     General:        Right eye: No discharge.        Left eye: No discharge.    Cardiovascular:     Rate and Rhythm: Normal rate and regular rhythm.     Heart sounds: No murmur heard.    No friction rub. No gallop.  Pulmonary:     Effort: Pulmonary effort is normal.     Breath sounds: Normal breath sounds.  Abdominal:     General: Bowel sounds are normal.     Palpations: Abdomen is soft.     Comments: Mild suprapubic ttp, no rebound, rigidity, guarding   Skin:    General: Skin is warm and dry.     Capillary Refill: Capillary refill takes less than 2 seconds.   Neurological:     Mental Status: She is alert and oriented to person, place, and time.   Psychiatric:        Mood and Affect: Mood normal.        Behavior: Behavior normal.     (all labs ordered are listed, but only abnormal results are displayed) Labs Reviewed  URINALYSIS, ROUTINE W REFLEX MICROSCOPIC - Abnormal; Notable for the following components:  Result Value   APPearance HAZY (*)    Hgb urine dipstick LARGE (*)    Protein, ur 30 (*)    Leukocytes,Ua LARGE (*)    Bacteria, UA FEW (*)    All other components within normal limits  URINE CULTURE    EKG: None  Radiology: No results found.   Procedures   Medications Ordered in the ED  cephALEXin (KEFLEX) capsule 1,000 mg (has no administration in time range)                                    Medical Decision Making Amount and/or Complexity of Data Reviewed Labs: ordered.  Risk Prescription drug management.   This patient is a 52 y.o. female who presents to the ED for concern of dysuria.   Differential diagnoses prior to evaluation: Acute cystitis, pyelonephritis, kidney stone vs other  Past Medical History / Social History / Additional history: Chart reviewed.  Pertinent results include: hypertension, prediabetes, housing instability  Physical Exam: Physical exam performed. The pertinent findings include: VSS other than 158/52 BP. Mild ttp suprapubically, no other abdominal ttp.   UA shows large HGB, large leukocytes, bacteria, >50 WBCs, WBC clumps  Medications / Treatment: Sent urine for culture, given antibiotics in ED and sent with prescription, discharged in stbale condition at this time   Disposition: After consideration of the diagnostic results and the patients response to treatment, I feel that patient is stable for discharge with plan as above .   emergency department workup does not suggest an emergent condition requiring admission or immediate intervention beyond what has been performed at this time. The plan is: as above. The patient is safe for discharge and has been instructed to return immediately for worsening symptoms, change in symptoms or any other concerns.   Final diagnoses:  Acute cystitis with hematuria    ED Discharge Orders          Ordered    cefadroxil (DURICEF) 500 MG capsule  2 times daily        01/13/24 2340               Quayshaun Hubbert, [place] H, PA-C 01/13/24 2344    Lindle Rhea, MD 01/14/24 [phone]

## 2024-01-13 NOTE — Discharge Instructions (Addendum)
 Please use Tylenol  or ibuprofen  for pain.  You may use 600 mg ibuprofen  every 6 hours or 1000 mg of Tylenol  every 6 hours.  You may choose to alternate between the 2.  This would be most effective.  Not to exceed 4 g of Tylenol  within 24 hours.  Not to exceed 3200 mg ibuprofen  24 hours.  Please take the entire course of antibiotics that we have prescribed.  Veuillez utiliser du Tylenol  ou de l'ibuprofne pour [employer]. Vous pouvez utiliser 600 mg d'ibuprofne toutes les 6 heures ou 1 000 mg de Tylenol  toutes les 6 heures. Vous pouvez alterner [employer] deux, ce qui est plus efficace. Ne pas dpasser 4 g de Tylenol  par 24 heures. Ne pas dpasser 3 200 mg d'ibuprofne par 24 heures.  Veuillez suivre la totalit du traitement antibiotique prescrit.

## 2024-01-14 DIAGNOSIS — N3001 Acute cystitis with hematuria: Secondary | ICD-10-CM | POA: Diagnosis not present

## 2024-01-16 LAB — URINE CULTURE: Culture: >=100000 — AB

## 2024-01-17 ENCOUNTER — Telehealth (HOSPITAL_BASED_OUTPATIENT_CLINIC_OR_DEPARTMENT_OTHER): Payer: Self-pay | Admitting: *Deleted

## 2024-01-17 NOTE — Telephone Encounter (Signed)
 Post ED Visit - Positive Culture Follow-up  Culture report reviewed by antimicrobial stewardship pharmacist: [facility] []  Rankin Dee, Pharm.D. []  Venetia Gully, Pharm.D., BCPS AQ-ID []  Garrel Crews, Pharm.D., BCPS []  Almarie Lunger, [address].D., BCPS []  [place], [address].D., BCPS, AAHIVP []  Rosaline Bihari, Pharm.D., BCPS, AAHIVP []  Vernell Meier, PharmD, BCPS []  Latanya Hint, PharmD, BCPS []  Donald Medley, PharmD, BCPS []  Rocky Bold, PharmD []  Dorothyann Alert, PharmD, BCPS []  Morene Babe, PharmD  [facility] [x]  Damien Quiet, PharmD []  Romona Bliss, PharmD []  Dolphus Roller, PharmD []  Veva Seip, Rph []  Vernell Daunt) Leonce, PharmD []  Eva Allis, PharmD []  Rosaline Millet, PharmD []  Iantha Batch, PharmD []  Arvin Gauss, PharmD []  Wanda Hasting, PharmD []  Ronal Rav, PharmD []  Rocky Slade, PharmD []  Bard Jeans, PharmD   Positive urine culture Treated with cefadroxil , organism sensitive to the same and no further patient follow-up is required at this time.  Megan Baxter 01/17/2024, 12:18 PM

## 2024-01-24 ENCOUNTER — Emergency Department (HOSPITAL_COMMUNITY)
Admission: EM | Admit: 2024-01-24 | Discharge: 2024-01-24 | Disposition: A | Discharge status: Home / Self Care | Attending: Emergency Medicine | Admitting: Emergency Medicine

## 2024-01-24 ENCOUNTER — Other Ambulatory Visit: Payer: Self-pay

## 2024-01-24 ENCOUNTER — Encounter (HOSPITAL_COMMUNITY): Payer: Self-pay

## 2024-01-24 DIAGNOSIS — R300 Dysuria: Secondary | ICD-10-CM | POA: Insufficient documentation

## 2024-01-24 DIAGNOSIS — R309 Painful micturition, unspecified: Secondary | ICD-10-CM | POA: Diagnosis present

## 2024-01-24 LAB — URINALYSIS, ROUTINE W REFLEX MICROSCOPIC
Bilirubin Urine: NEGATIVE
Glucose, UA: NEGATIVE mg/dL
Hgb urine dipstick: NEGATIVE
Ketones, ur: NEGATIVE mg/dL
Leukocytes,Ua: NEGATIVE
Nitrite: NEGATIVE
Protein, ur: NEGATIVE mg/dL
Specific Gravity, Urine: 1.005 (ref 1.005–1.030)
pH: 6 (ref 5.0–8.0)

## 2024-01-24 NOTE — ED Provider Notes (Signed)
  EMERGENCY DEPARTMENT AT [facility] Provider Note   CSN: [id number] Arrival date & time: 01/24/24  1156     Patient presents with: No chief complaint on file.   Megan Baxter is a 53 y.o. female.   The history is provided by the patient and medical records. No language interpreter was used.     53 year old French-speaking female presenting requesting for recheck of her UTI.  I was able to communicate with patient without difficulty.  Patient report 01/13/2024  she was having urinary discomfort.  She was seen in the ER, and was diagnosed with having urinary tract infection.  She was prescribed Keflex  as treatment.  She has finished the antibiotic and her symptoms did improve.  She is returning today to have her urine rechecked to ensure her infection is gone.  She denies any fever, back pain, increased dysuria, vaginal bleeding or vaginal discharge.  No new sexual partner.  No other complaint.  Prior to Admission medications   Medication Sig Start Date End Date Taking? Authorizing Provider  atorvastatin  (LIPITOR) 20 MG tablet Take 1 tablet by mouth once daily 09/14/23   Joshua Domino, DO  cefadroxil  (DURICEF) 500 MG capsule Take 1 capsule (500 mg total) by mouth 2 (two) times daily. 01/13/24   Prosperi, Christian H, PA-C  EPINEPHrine  0.3 mg/0.3 mL IJ SOAJ injection Inject 0.3 mLs (0.3 mg total) into the muscle as needed for anaphylaxis. 06/05/19   Fulp, Cammie, MD  erythromycin  ophthalmic ointment Place a 1/2 inch ribbon of ointment into the lower left eyelid nightly for 7 days. 12/15/23   White, Elizabeth A, PA-C  Olmesartan -amLODIPine -HCTZ 40-10-25 MG TABS Take 1 tablet by mouth daily. 10/26/22   Dahbura, Anton, DO  Olmesartan -amLODIPine -HCTZ 40-10-25 MG TABS Take 1 tablet by mouth daily. 01/09/24   Laurice Maude BROCKS, MD  spironolactone  (ALDACTONE ) 50 MG tablet Take 1 tablet (50 mg total) by mouth daily. 12/16/22   Orlando Pond, DO  lisinopril -hydrochlorothiazide   (ZESTORETIC ) 20-12.5 MG tablet Take 2 tablets by mouth daily. 05/01/19 05/14/19  Alec House, MD    Allergies: Drug class [pork-derived products]    Review of Systems  Constitutional:  Negative for fever.  Gastrointestinal:  Negative for abdominal pain.  Genitourinary:  Negative for dysuria and urgency.    Updated Vital Signs BP (!) 141/86   Pulse 81   Wt 75 kg   LMP 12/30/2023 (Exact Date)   SpO2 100%   BMI 27.51 kg/m   Physical Exam Vitals and nursing note reviewed.  Constitutional:      General: She is not in acute distress.    Appearance: She is well-developed.  HENT:     Head: Atraumatic.   Eyes:     Conjunctiva/sclera: Conjunctivae normal.    Cardiovascular:     Rate and Rhythm: Normal rate and regular rhythm.     Pulses: Normal pulses.     Heart sounds: Normal heart sounds.  Pulmonary:     Effort: Pulmonary effort is normal.  Abdominal:     Palpations: Abdomen is soft.     Tenderness: There is no abdominal tenderness. There is no right CVA tenderness or left CVA tenderness.   Musculoskeletal:     Cervical back: Neck supple.   Skin:    Findings: No rash.   Neurological:     Mental Status: She is alert.   Psychiatric:        Mood and Affect: Mood normal.     (all labs ordered are listed, but  only abnormal results are displayed) Labs Reviewed  URINALYSIS, ROUTINE W REFLEX MICROSCOPIC    EKG: None  Radiology: No results found.   Procedures   Medications Ordered in the ED - No data to display                                  Medical Decision Making  BP (!) 141/86   Pulse 81   Wt 75 kg   LMP 12/30/2023 (Exact Date)   SpO2 100%   BMI 27.51 kg/m   47:102 PM  53 year old French-speaking female presenting requesting for recheck of her UTI.  I was able to communicate with patient without difficulty.  Patient report 01/13/2024  she was having urinary discomfort.  She was seen in the ER, and was diagnosed with having urinary tract  infection.  She was prescribed Keflex  as treatment.  She has finished the antibiotic and her symptoms did improve.  She is returning today to have her urine rechecked to ensure her infection is gone.  She denies any fever, back pain, increased dysuria, vaginal bleeding or vaginal discharge.  No new sexual partner.  No other complaint.  On exam this is a well-appearing female resting comfortably appears to be in no acute discomfort.  Abdomen is soft nontender.  She does not exhibit any CVA tenderness.  Urinalysis obtained independently inter by me and overall reassuring no signs of UTI.  This is an improvement from previous urinalysis which did show evidence of infection.  Patient was reassured.  She is stable for discharge.  I do not think STI screening is indicated at this time.     Final diagnoses:  Dysuria    ED Discharge Orders     None          Nivia Colon, PA-C 01/24/24 1320    Randol Simmonds, MD 01/25/24 [id number]

## 2024-01-24 NOTE — ED Triage Notes (Signed)
 POV/ pt want to be checked for UTI/ pt denies pain or any symptoms/ pt complete abx and feels better

## 2024-01-24 NOTE — Discharge Instructions (Signed)
 Your recent UTI have been fully treated with previous antibiotic.  You may follow-up with your doctor for further care.  No additional antibiotic indicated.

## 2024-01-24 NOTE — ED Notes (Signed)
 Using interpreter, clarified pt has no symptoms and would like a repeat urine test to ensure everything is fine

## 2024-01-29 ENCOUNTER — Emergency Department (HOSPITAL_COMMUNITY)
Admission: EM | Admit: 2024-01-29 | Discharge: 2024-01-30 | Disposition: A | Discharge status: Home / Self Care | Attending: Emergency Medicine | Admitting: Emergency Medicine

## 2024-01-29 ENCOUNTER — Encounter (HOSPITAL_COMMUNITY): Payer: Self-pay

## 2024-01-29 ENCOUNTER — Other Ambulatory Visit: Payer: Self-pay

## 2024-01-29 ENCOUNTER — Emergency Department (HOSPITAL_COMMUNITY)

## 2024-01-29 DIAGNOSIS — R1031 Right lower quadrant pain: Secondary | ICD-10-CM | POA: Insufficient documentation

## 2024-01-29 DIAGNOSIS — I10 Essential (primary) hypertension: Secondary | ICD-10-CM | POA: Insufficient documentation

## 2024-01-29 LAB — COMPREHENSIVE METABOLIC PANEL WITH GFR
ALT: 17 U/L (ref 0–44)
AST: 15 U/L (ref 15–41)
Albumin: 3.8 g/dL (ref 3.5–5.0)
Alkaline Phosphatase: 90 U/L (ref 38–126)
Anion gap: 8 (ref 5–15)
BUN: 8 mg/dL (ref 6–20)
CO2: 23 mmol/L (ref 22–32)
Calcium: 9.1 mg/dL (ref 8.9–10.3)
Chloride: 104 mmol/L (ref 98–111)
Creatinine, Ser: 0.7 mg/dL (ref 0.44–1.00)
GFR, Estimated: >60 mL/min (ref >60)
Glucose, Bld: 104 mg/dL — ABNORMAL HIGH (ref 70–99)
Potassium: 3.5 mmol/L (ref 3.5–5.1)
Sodium: 135 mmol/L (ref 135–145)
Total Bilirubin: 0.7 mg/dL (ref 0.0–1.2)
Total Protein: 7.3 g/dL (ref 6.5–8.1)

## 2024-01-29 LAB — URINALYSIS, ROUTINE W REFLEX MICROSCOPIC
Bilirubin Urine: NEGATIVE
Glucose, UA: NEGATIVE mg/dL
Ketones, ur: NEGATIVE mg/dL
Leukocytes,Ua: NEGATIVE
Nitrite: NEGATIVE
Protein, ur: NEGATIVE mg/dL
Specific Gravity, Urine: 1.003 — ABNORMAL LOW (ref 1.005–1.030)
pH: 5 (ref 5.0–8.0)

## 2024-01-29 LAB — PREGNANCY, URINE: Preg Test, Ur: NEGATIVE

## 2024-01-29 LAB — CBC WITH DIFFERENTIAL/PLATELET
Abs Immature Granulocytes: 0.03 K/uL (ref 0.00–0.07)
Basophils Absolute: 0.1 K/uL (ref 0.0–0.1)
Basophils Relative: 1 %
Eosinophils Absolute: 0.4 K/uL (ref 0.0–0.5)
Eosinophils Relative: 4 %
HCT: 33.4 % — ABNORMAL LOW (ref 36.0–46.0)
Hemoglobin: 10.5 g/dL — ABNORMAL LOW (ref 12.0–15.0)
Immature Granulocytes: 0 %
Lymphocytes Relative: 29 %
Lymphs Abs: 2.8 K/uL (ref 0.7–4.0)
MCH: 25.3 pg — ABNORMAL LOW (ref 26.0–34.0)
MCHC: 31.4 g/dL (ref 30.0–36.0)
MCV: 80.5 fL (ref 80.0–100.0)
Monocytes Absolute: 0.6 K/uL (ref 0.1–1.0)
Monocytes Relative: 7 %
Neutro Abs: 5.8 K/uL (ref 1.7–7.7)
Neutrophils Relative %: 59 %
Platelets: 440 K/uL — ABNORMAL HIGH (ref 150–400)
RBC: 4.15 MIL/uL (ref 3.87–5.11)
RDW: 16.4 % — ABNORMAL HIGH (ref 11.5–15.5)
WBC: 9.7 K/uL (ref 4.0–10.5)
nRBC: 0 % (ref 0.0–0.2)

## 2024-01-29 LAB — LIPASE, BLOOD: Lipase: 30 U/L (ref 11–51)

## 2024-01-29 MED ORDER — IOHEXOL 300 MG/ML  SOLN
100.0000 mL | Freq: Once | INTRAMUSCULAR | Status: AC | PRN
  Administered 2024-01-29: 100 mL via INTRAVENOUS

## 2024-01-29 NOTE — ED Triage Notes (Signed)
 Pt reports recent abx for UTI. Pt states that now her stomach is hard and she has difficulty having a BM. No relief with otc meds.

## 2024-01-29 NOTE — ED Provider Notes (Signed)
 [place] EMERGENCY DEPARTMENT AT [facility] Provider Note   CSN: [id number] Arrival date & time: 01/29/24  1646     Patient presents with: Constipation   Shevaun Taulbee is a 53 y.o. female.  Constipation Associated symptoms: abdominal pain    Patient is a 53 year old female presents the ED today with concerns for right lower quadrant abdominal pain that began suddenly 3 days ago and been progressively worse.  She has been stating that she is struggled with constipation like symptoms with hard bowel movements over this period of time as well.  Last bowel movement was yesterday.  Previous medical history of HTN.  Currently on her menstrual cycle with active vaginal bleeding.  No vaginal pain.   I was able to communicate well with the patient without interpreter, for she preferred not to use 1.  Recently treated for UTI with Keflex  with symptoms resolved.  Denies headache, vision changes, fever, chest pain, shortness of breath, nausea, vomiting, diarrhea, dysuria, hematuria, lower leg swelling.    Prior to Admission medications   Medication Sig Start Date End Date Taking? Authorizing Provider  atorvastatin  (LIPITOR) 20 MG tablet Take 1 tablet by mouth once daily 09/14/23   Joshua Domino, DO  cefadroxil  (DURICEF) 500 MG capsule Take 1 capsule (500 mg total) by mouth 2 (two) times daily. 01/13/24   Prosperi, Christian H, PA-C  EPINEPHrine  0.3 mg/0.3 mL IJ SOAJ injection Inject 0.3 mLs (0.3 mg total) into the muscle as needed for anaphylaxis. 06/05/19   Fulp, Cammie, MD  erythromycin  ophthalmic ointment Place a 1/2 inch ribbon of ointment into the lower left eyelid nightly for 7 days. 12/15/23   White, Elizabeth A, PA-C  Olmesartan -amLODIPine -HCTZ 40-10-25 MG TABS Take 1 tablet by mouth daily. 10/26/22   Dahbura, Anton, DO  Olmesartan -amLODIPine -HCTZ 40-10-25 MG TABS Take 1 tablet by mouth daily. 01/09/24   Laurice Maude BROCKS, MD  spironolactone  (ALDACTONE ) 50 MG tablet Take 1 tablet  (50 mg total) by mouth daily. 12/16/22   Orlando Pond, DO  lisinopril -hydrochlorothiazide  (ZESTORETIC ) 20-12.5 MG tablet Take 2 tablets by mouth daily. 05/01/19 05/14/19  Alec House, MD    Allergies: Drug class [pork-derived products]    Review of Systems  Gastrointestinal:  Positive for abdominal pain and constipation.  All other systems reviewed and are negative.   Updated Vital Signs BP (!) 164/102 (BP Location: Left Arm)   Pulse 71   Temp 98.5 F (36.9 C) (Oral)   Resp 16   LMP 01/29/2024 (Exact Date)   SpO2 100%   Physical Exam Vitals and nursing note reviewed.  Constitutional:      General: She is not in acute distress.    Appearance: Normal appearance. She is not ill-appearing or diaphoretic.  HENT:     Head: Normocephalic and atraumatic.     Mouth/Throat:     Mouth: Mucous membranes are moist.     Pharynx: Oropharynx is clear. No oropharyngeal exudate or posterior oropharyngeal erythema.  Eyes:     General: No scleral icterus.       Right eye: No discharge.        Left eye: No discharge.     Extraocular Movements: Extraocular movements intact.     Conjunctiva/sclera: Conjunctivae normal.  Cardiovascular:     Rate and Rhythm: Normal rate and regular rhythm.     Pulses: Normal pulses.     Heart sounds: Normal heart sounds. No murmur heard.    No friction rub. No gallop.  Pulmonary:  Effort: Pulmonary effort is normal. No respiratory distress.     Breath sounds: Normal breath sounds.  Abdominal:     General: Abdomen is flat. There is no distension.     Palpations: Abdomen is soft.     Tenderness: There is abdominal tenderness (Mild right lower quadrant abdominal pain noted on exam). There is no right CVA tenderness, left CVA tenderness, guarding or rebound.     Hernia: No hernia is present.  Musculoskeletal:     Cervical back: Normal range of motion and neck supple. No rigidity or tenderness.     Right lower leg: No edema.     Left lower leg: No edema.   Skin:    General: Skin is warm and dry.     Findings: No bruising, erythema or lesion.  Neurological:     General: No focal deficit present.     Mental Status: She is alert and oriented to person, place, and time. Mental status is at baseline.     Sensory: No sensory deficit.     Motor: No weakness.     Coordination: Coordination normal.     Gait: Gait normal.  Psychiatric:        Mood and Affect: Mood normal.     (all labs ordered are listed, but only abnormal results are displayed) Labs Reviewed  URINALYSIS, ROUTINE W REFLEX MICROSCOPIC - Abnormal; Notable for the following components:      Result Value   Color, Urine STRAW (*)    Specific Gravity, Urine 1.003 (*)    Hgb urine dipstick LARGE (*)    Bacteria, UA RARE (*)    All other components within normal limits  CBC WITH DIFFERENTIAL/PLATELET - Abnormal; Notable for the following components:   Hemoglobin 10.5 (*)    HCT 33.4 (*)    MCH 25.3 (*)    RDW 16.4 (*)    Platelets 440 (*)    All other components within normal limits  COMPREHENSIVE METABOLIC PANEL WITH GFR - Abnormal; Notable for the following components:   Glucose, Bld 104 (*)    All other components within normal limits  LIPASE, BLOOD  PREGNANCY, URINE    EKG: None  Radiology: CT ABDOMEN PELVIS W CONTRAST Result Date: 01/29/2024 CLINICAL DATA:  RLQ abdominal pain EXAM: CT ABDOMEN AND PELVIS WITH CONTRAST TECHNIQUE: Multidetector CT imaging of the abdomen and pelvis was performed using the standard protocol following bolus administration of intravenous contrast. RADIATION DOSE REDUCTION: This exam was performed according to the departmental dose-optimization program which includes automated exposure control, adjustment of the mA and/or kV according to patient size and/or use of iterative reconstruction technique. CONTRAST:  OMNIPAQUE  IOHEXOL  300 MG/ML  SOLN COMPARISON:  CT renal 11/29/2019, CT abdomen pelvis 05/21/2016 FINDINGS: Lower chest: No acute  abnormality.  Tiny hiatal hernia. Hepatobiliary: No focal liver abnormality. No gallstones, gallbladder wall thickening, or pericholecystic fluid. No biliary dilatation. Pancreas: No focal lesion. Normal pancreatic contour. No surrounding inflammatory changes. No main pancreatic ductal dilatation. Spleen: Normal in size without focal abnormality. Adrenals/Urinary Tract: No adrenal nodule bilaterally. Bilateral kidneys enhance symmetrically. No hydronephrosis. No hydroureter. The urinary bladder is unremarkable. On delayed imaging, there is no urothelial wall thickening and there are no filling defects in the opacified portions of the bilateral collecting systems or ureters. Stomach/Bowel: Stomach is within normal limits. No evidence of bowel wall thickening or dilatation. Appendix appears normal. Vascular/Lymphatic: No abdominal aorta or iliac aneurysm. Moderate atherosclerotic plaque of the aorta and its branches. No  abdominal, pelvic, or inguinal lymphadenopathy. Reproductive: Anterior uterine wall fibroid. Otherwise uterus and bilateral adnexa are unremarkable. Other: No intraperitoneal free fluid. No intraperitoneal free gas. No organized fluid collection. Musculoskeletal: No abdominal wall hernia or abnormality. No suspicious lytic or blastic osseous lesions. No acute displaced fracture. IMPRESSION: 1. No acute intra-abdominal or intrapelvic abnormality. 2. Tiny hiatal hernia. Electronically Signed   By: Morgane  Naveau M.D.   On: 01/29/2024 23:14     Procedures   Medications Ordered in the ED  iohexol  (OMNIPAQUE ) 300 MG/ML solution 100 mL (100 mLs Intravenous Contrast Given 01/29/24 2237)                                   Medical Decision Making Amount and/or Complexity of Data Reviewed Labs: ordered. Radiology: ordered.  Risk Prescription drug management.   This patient is a 53 year old female who presents to the ED for concern of right lower quadrant abdominal pain that began 2 days ago.   Noted to be currently on her menstrual cycle at this time, not experiencing any other symptoms at this time outside of intermittent abdominal pain.  On physical exam, patient is in no acute distress, afebrile, alert and orient x 4, speaking in full sentences, nontachypneic, nontachycardic.  Notably has mild lower abdominal tenderness to palpation to the right lower quadrant, negative Murphy sign, negative Rovsing sign, negative psoas sign.  No CVA tenderness.  LCTAB, RRR, no murmur, no lower leg edema.  Unremarkable exam otherwise.  With patient and daughter requesting for her to get CT scan at this time with concerns for possible appendicitis, ordered CT scan.  Lab work did show a mild anemia which was similar to previous labs.  CT scan showed a small hiatal hernia with no acute findings.  With patient's currently resting well and minimal symptoms at this time, we will have her continue to follow-up with PCP.  Low suspicion for any acute or emergent intra-abdominal pathology at this time.  Low suspicion for any ovarian torsion, endometriosis, ovarian cyst, diverticulitis.  Patient vital signs have remained stable throughout the course of patient's time in the ED. Low suspicion for any other emergent pathology at this time. I believe this patient is safe to be discharged. Provided strict return to ER precautions. Patient expressed agreement and understanding of plan. All questions were answered.  Differential diagnoses prior to evaluation: The emergent differential diagnosis includes, but is not limited to, appendicitis, Diverticulitis, Gastritis, Enteritis, Incarcerated or Strangulated Hernia, Nephrolithiasis , Pyelonephritis, Inflammatory Bowel Disease, Mesenteric Adenitis, Gastroenteritis, Constipation,   Endometriosis, Ectopic Pregnancy, Ovarian Torsion, Ruptured Ovarian Cyst, Pelvic Inflammatory Disease (PID), Tubo-Ovarian Abscess, Mittelschmerz. This is not an exhaustive differential.   Past Medical  History / Co-morbidities / Social History: HTN,  Additional history: Chart reviewed. Pertinent results include:   Seen on 01/24/2024 for dysuria noted to have 4 ED visits in the last 2 weeks.  Noted to have been provided Keflex  for the ED visit prior.  She finished her treatment course and was still experiencing some dysuria.  UA negative at that time.  Discharged home.  Lab Tests/Imaging studies: I personally interpreted labs/imaging and the pertinent results include:    UA shows low specific gravity as well as large hemoglobin on dipstick.. CBC shows mild anemia with hemoglobin 10.5, similar to previous CMP unremarkable Lipase unremarkable Present test negative CT of abdomen pelvis does show small hiatal hernia with no acute intra-abdominal abnormalities  I agree with the radiologist interpretation.    Medications:  I have reviewed the patients home medicines and have made adjustments as needed.  Critical Interventions: None  Social Determinants of Health: Patient is not an native [place] speaker, wishing to communicate herself without the use of interpreter.  Disposition: After consideration of the diagnostic results and the patients response to treatment, I feel that the patient would benefit from discharge and treatment as above.   emergency department workup does not suggest an emergent condition requiring admission or immediate intervention beyond what has been performed at this time. The plan is: Establish care with PCP, return to the ED for new or worsening symptoms. The patient is safe for discharge and has been instructed to return immediately for worsening symptoms, change in symptoms or any other concerns.     Final diagnoses:  Right lower quadrant abdominal pain    ED Discharge Orders     None          Beola Terrall GORMAN DEVONNA 01/29/24 2353    Charlyn Sora, MD 01/30/24 1505

## 2024-01-29 NOTE — Discharge Instructions (Addendum)
 You are seen today for right lower quad abdominal pain.  Your lab work and imaging today were reassuring that I have low suspicion for any emergent causes of your symptoms today.  Your lab work did show a mild anemia which I recommended for you to continue to follow-up with your PCP for further monitoring as you may need iron supplementation.  CT scan also noted a small hiatal hernia which is a type of hernia with your stomach but do not believe that this is cause of any pain that you are experiencing today.  This is more of an incidental finding.  Recommend continue to follow-up with PCP as well as return to the ED if you begin to have any new or worsening symptoms.  Please manage pain with Tylenol  and ibuprofen   Take Tylenol  (acetominophen)  650mg  every 4-6 hours, as needed for pain or fever. Do not take more than 4,000 mg in a 24-hour period. As this may cause liver damage. While this is rare, if you begin to develop yellowing of the skin or eyes, stop taking and return to ER immediately.   Take Ibuprofen  400mg  every 4-6 hours for pain or fever, not exceeding 3,200 mg per day as more than 3,200mg  can cause Stomach irritation, dizziness, kidney issues with long-term use.

## 2024-03-14 ENCOUNTER — Other Ambulatory Visit: Payer: Self-pay

## 2024-03-14 DIAGNOSIS — E782 Mixed hyperlipidemia: Secondary | ICD-10-CM

## 2024-03-14 MED ORDER — ATORVASTATIN CALCIUM 20 MG PO TABS: 20.0000 mg | ORAL_TABLET | Freq: Every day | ORAL | 0 refills | Status: DC

## 2024-05-03 ENCOUNTER — Ambulatory Visit (INDEPENDENT_AMBULATORY_CARE_PROVIDER_SITE_OTHER): Admitting: Student

## 2024-05-03 VITALS — BP 204/100 | HR 80 | Ht 65.0 in | Wt 164.6 lb

## 2024-05-03 DIAGNOSIS — Z1231 Encounter for screening mammogram for malignant neoplasm of breast: Secondary | ICD-10-CM | POA: Diagnosis not present

## 2024-05-03 DIAGNOSIS — S46812D Strain of other muscles, fascia and tendons at shoulder and upper arm level, left arm, subsequent encounter: Secondary | ICD-10-CM

## 2024-05-03 DIAGNOSIS — I10 Essential (primary) hypertension: Secondary | ICD-10-CM

## 2024-05-03 MED ORDER — MELOXICAM 15 MG PO TABS: 15.0000 mg | ORAL_TABLET | Freq: Every day | ORAL | 0 refills | Status: DC

## 2024-05-03 NOTE — Patient Instructions (Addendum)
 Ravi de vous voir aujourd'hui.  Je pense que votre mal de dos est probablement d  [employer]  Je vous ai prescrit du [employer], que vous prendrez une fois par jour pendant 30 jours. Je vous recommande galement de porter un soutien-gorge de sport plutt qu'un soutien-gorge classique afin de dtendre Clinical research associate, car cela pourrait Oncologist. Si vous prenez ce mdicament, vous pouvez prendre du [employer] prises et  la place de l'Advil .  Votre tension artrielle tait leve aujourd'hui. Veuillez vous assurer de prendre vos mdicaments antihypertenseurs. Vrifiez votre tension artrielle quotidiennement et [employer] un journal de votre tension artrielle. Un suivi dans 2  3 semaines nous permettra de connatre votre tension artrielle moyenne  domicile.  J'ai galement command une mammographie. L'adresse est [address], [address], [place], [place]. Vous pouvez appeler le [phone] pour prendre rendez-vous.    Pleasure to see you today.  Suspect your back pain is most likely a muscle strain.  I have sent in prescription for meloxicam  which you will take once daily for 30 days.  Also I recommend wearing a sports bra instead of the regular bra to loosen the strap at the back as this could be contributing to the pain.  When taking this medication you can do Tylenol  in between the medication and instead of at Advil .  Your blood pressure today was elevated.  Please make sure you are taking your blood pressure medications.  Check your blood pressures daily and keep a daily blood pressure logs.  Follow-up in 2-3 weeks let us  see what your blood pressures averaging at home.  I have also placed order for mammogram.  The address is [address], [place], [place].  You can call them at [phone] to schedule appointment.

## 2024-05-03 NOTE — Assessment & Plan Note (Signed)
 Upper back pain appears to be musculoskeletal.  Unsure if this is work-related or due to tight bra strap.  Will treat patient with meloxicam  once daily for 30 days and recommend switching to sports bra and monitoring if there is relieve with the change.

## 2024-05-03 NOTE — Progress Notes (Signed)
    SUBJECTIVE:   CHIEF COMPLAINT / HPI:   53 year old female presenting today for follow-up of back pain Patient reports this has been chronic over the years However has had more pain in the last week or 2 Described as sharp pain that gets across the axilla area under her bra line and tender to touch No recent trauma. 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 Advil  has provided some relief. Denies any worsening chest pain with activities  PERTINENT  PMH / PSH: Reviewed   OBJECTIVE:   BP (!) 162/94   Pulse 80   Ht 5' 5 (1.651 m)   Wt 164 lb 9.6 oz (74.7 kg)   SpO2 100%   BMI 27.39 kg/m    Physical Exam General: Alert, well appearing, NAD Cardiovascular: RRR, No Murmurs, Normal S2/S2 Respiratory: CTAB, No wheezing or Rales Abdomen: No distension or tenderness MSK: Mild tenderness over the left upper back, no notable deformity or skin changes  ASSESSMENT/PLAN:   Trapezius muscle strain Upper back pain appears to be musculoskeletal.  Unsure if this is work-related or due to tight bra strap.  Will treat patient with meloxicam  once daily for 30 days and recommend switching to sports bra and monitoring if there is relieve with the change.  Hypertensive disorder Poorly controlled hypertension.  Per chart review has persistently had elevated BP.  Patient endorses compliance to medication but has not taken BP meds today.  Advised to keep daily blood pressure logs and follow-up in 2-3 weeks reassess blood pressure goal.  Will likely need an increase in current dose.    Breast cancer screening Ordered screening mammogram   Norleen April, MD [facility]

## 2024-05-03 NOTE — Assessment & Plan Note (Signed)
 Poorly controlled hypertension.  Per chart review has persistently had elevated BP.  Patient endorses compliance to medication but has not taken BP meds today.  Advised to keep daily blood pressure logs and follow-up in 2-3 weeks reassess blood pressure goal.  Will likely need an increase in current dose.

## 2024-05-22 ENCOUNTER — Ambulatory Visit: Admission: RE | Admit: 2024-05-22 | Discharge: 2024-05-22 | Disposition: A | Source: Ambulatory Visit

## 2024-05-22 DIAGNOSIS — Z1231 Encounter for screening mammogram for malignant neoplasm of breast: Secondary | ICD-10-CM

## 2024-06-06 ENCOUNTER — Other Ambulatory Visit: Payer: Self-pay

## 2024-06-06 DIAGNOSIS — I10 Essential (primary) hypertension: Secondary | ICD-10-CM

## 2024-06-08 ENCOUNTER — Telehealth: Payer: Self-pay

## 2024-06-08 NOTE — Telephone Encounter (Signed)
 Called [employer] and spoke with Alm ID# [id number] to assist with patient via telephone.  Attempted to call patient and left generic VM for patient to call back.  If patient calls back, please verify if patient taking a medication named spironolactone  regularly per Dr. Osker request since the medication was last filled in September of 2024.  Harlene Reiter, CMA

## 2024-06-28 ENCOUNTER — Other Ambulatory Visit: Payer: Self-pay

## 2024-06-28 DIAGNOSIS — I10 Essential (primary) hypertension: Secondary | ICD-10-CM

## 2024-06-28 MED ORDER — SPIRONOLACTONE 50 MG PO TABS: 50.0000 mg | ORAL_TABLET | Freq: Every day | ORAL | 1 refills | Status: DC

## 2024-07-05 ENCOUNTER — Other Ambulatory Visit: Payer: Self-pay

## 2024-07-05 DIAGNOSIS — E782 Mixed hyperlipidemia: Secondary | ICD-10-CM

## 2024-07-17 ENCOUNTER — Ambulatory Visit: Payer: Self-pay | Admitting: Family Medicine

## 2024-07-17 ENCOUNTER — Encounter: Payer: Self-pay | Admitting: Family Medicine

## 2024-07-17 VITALS — BP 147/90 | HR 74 | Wt 158.8 lb

## 2024-07-17 DIAGNOSIS — I10 Essential (primary) hypertension: Secondary | ICD-10-CM

## 2024-07-17 DIAGNOSIS — E782 Mixed hyperlipidemia: Secondary | ICD-10-CM

## 2024-07-17 MED ORDER — SPIRONOLACTONE 50 MG PO TABS: 50.0000 mg | ORAL_TABLET | Freq: Every day | ORAL | 2 refills | Status: AC

## 2024-07-17 MED ORDER — ATORVASTATIN CALCIUM 20 MG PO TABS: 20.0000 mg | ORAL_TABLET | Freq: Every day | ORAL | 2 refills | Status: AC

## 2024-07-17 MED ORDER — OLMESARTAN-AMLODIPINE-HCTZ 40-10-25 MG PO TABS: 1.0000 | ORAL_TABLET | Freq: Every day | ORAL | 2 refills | Status: AC

## 2024-07-17 NOTE — Assessment & Plan Note (Signed)
 Refilled atorvastatin

## 2024-07-17 NOTE — Patient Instructions (Signed)
 Good to see you today - Thank you for coming in  Things we discussed today:  Please follow up in 2-4 weeks to make sure your blood pressure has improved I have refilled your medicine for 3 months

## 2024-07-17 NOTE — Progress Notes (Signed)
" ° ° °  SUBJECTIVE:   CHIEF COMPLAINT / HPI:   French interpreter Ipad present, daughter present  Has been out of medication for 2 weeks Taking spironolactone , atorvastatin , olmesartan -amlodipine -hydrochlorothiazide  Denies vision changes, weakness, or any other concerns.  Reports intermittent headaches since running out of her medicine.  PERTINENT  PMH / PSH: hypertension, prediabetes, HLD  OBJECTIVE:   BP (!) 172/100   Pulse 74   Wt 158 lb 12.8 oz (72 kg)   SpO2 100%   BMI 26.43 kg/m   General: well appearing, NAD Cardiovascular: RRR, no m/r/g Respiratory: normal work of breathing on RA, CTAB  ASSESSMENT/PLAN:   Assessment & Plan Essential hypertension Refilled spironolactone  and olmesartan  amlodipine  HCTZ Advised to follow-up in 2 to 4 weeks to ensure blood pressure has improved with medications Will check BMP today to assess for hyperkalemia Mixed hyperlipidemia Refilled atorvastatin      Elyce Prescott, DO [facility] "

## 2024-07-18 ENCOUNTER — Ambulatory Visit: Payer: Self-pay | Admitting: Family Medicine

## 2024-07-18 LAB — BASIC METABOLIC PANEL WITH GFR
BUN/Creatinine Ratio: 9 (ref 9–23)
BUN: 8 mg/dL (ref 6–24)
CO2: 22 mmol/L (ref 20–29)
Calcium: 9.4 mg/dL (ref 8.7–10.2)
Chloride: 100 mmol/L (ref 96–106)
Creatinine, Ser: 0.93 mg/dL (ref 0.57–1.00)
Glucose: 184 mg/dL — ABNORMAL HIGH (ref 70–99)
Potassium: 3.8 mmol/L (ref 3.5–5.2)
Sodium: 137 mmol/L (ref 134–144)
eGFR: 74 mL/min/1.73

## 2024-07-18 NOTE — Progress Notes (Signed)
 Patient called with French interpreter.  Unable to reach patient. If she calls back, her BMP looked good overall but her glucose was elevated. At her next appointment she should get an A1C.
